# Patient Record
Sex: Male | Born: 1955 | Race: White | Hispanic: No | Marital: Married | Smoking: Never smoker
Health system: Southern US, Community
[De-identification: ages and names within clinical notes are randomized; demographics above are authoritative.]

## PROBLEM LIST (undated history)

## (undated) DIAGNOSIS — T8859XA Other complications of anesthesia, initial encounter: Secondary | ICD-10-CM

## (undated) DIAGNOSIS — R112 Nausea with vomiting, unspecified: Secondary | ICD-10-CM

## (undated) DIAGNOSIS — F419 Anxiety disorder, unspecified: Secondary | ICD-10-CM

## (undated) DIAGNOSIS — J302 Other seasonal allergic rhinitis: Secondary | ICD-10-CM

## (undated) DIAGNOSIS — K219 Gastro-esophageal reflux disease without esophagitis: Secondary | ICD-10-CM

## (undated) DIAGNOSIS — I10 Essential (primary) hypertension: Secondary | ICD-10-CM

## (undated) DIAGNOSIS — Z87442 Personal history of urinary calculi: Secondary | ICD-10-CM

## (undated) DIAGNOSIS — M1990 Unspecified osteoarthritis, unspecified site: Secondary | ICD-10-CM

## (undated) DIAGNOSIS — K5730 Diverticulosis of large intestine without perforation or abscess without bleeding: Secondary | ICD-10-CM

## (undated) DIAGNOSIS — IMO0002 Reserved for concepts with insufficient information to code with codable children: Secondary | ICD-10-CM

## (undated) DIAGNOSIS — T7840XA Allergy, unspecified, initial encounter: Secondary | ICD-10-CM

## (undated) DIAGNOSIS — Z9889 Other specified postprocedural states: Secondary | ICD-10-CM

## (undated) DIAGNOSIS — C801 Malignant (primary) neoplasm, unspecified: Secondary | ICD-10-CM

## (undated) DIAGNOSIS — I447 Left bundle-branch block, unspecified: Secondary | ICD-10-CM

## (undated) HISTORY — DX: Allergy, unspecified, initial encounter: T78.40XA

## (undated) HISTORY — PX: LITHOTRIPSY: SUR834

## (undated) HISTORY — PX: SPINE SURGERY: SHX786

## (undated) HISTORY — DX: Diverticulosis of large intestine without perforation or abscess without bleeding: K57.30

## (undated) HISTORY — PX: NECK SURGERY: SHX720

---

## 1994-03-30 HISTORY — PX: COLONOSCOPY: SHX174

## 1999-03-31 HISTORY — PX: COLONOSCOPY: SHX174

## 2001-03-17 ENCOUNTER — Encounter: Payer: Self-pay | Admitting: Family Medicine

## 2001-03-17 ENCOUNTER — Ambulatory Visit (HOSPITAL_COMMUNITY): Admission: RE | Admit: 2001-03-17 | Discharge: 2001-03-17 | Payer: Self-pay | Admitting: Family Medicine

## 2003-11-06 ENCOUNTER — Ambulatory Visit (HOSPITAL_COMMUNITY): Admission: RE | Admit: 2003-11-06 | Discharge: 2003-11-06 | Payer: Self-pay | Admitting: Family Medicine

## 2004-09-10 ENCOUNTER — Ambulatory Visit (HOSPITAL_COMMUNITY): Admission: RE | Admit: 2004-09-10 | Discharge: 2004-09-10 | Payer: Self-pay | Admitting: Family Medicine

## 2004-10-21 ENCOUNTER — Ambulatory Visit (HOSPITAL_COMMUNITY): Admission: RE | Admit: 2004-10-21 | Discharge: 2004-10-21 | Payer: Self-pay | Admitting: Family Medicine

## 2004-10-27 ENCOUNTER — Ambulatory Visit (HOSPITAL_COMMUNITY): Admission: RE | Admit: 2004-10-27 | Discharge: 2004-10-27 | Payer: Self-pay | Admitting: Urology

## 2004-10-29 ENCOUNTER — Ambulatory Visit (HOSPITAL_BASED_OUTPATIENT_CLINIC_OR_DEPARTMENT_OTHER): Admission: RE | Admit: 2004-10-29 | Discharge: 2004-10-29 | Payer: Self-pay | Admitting: Urology

## 2006-03-30 HISTORY — PX: HERNIA REPAIR: SHX51

## 2006-04-08 ENCOUNTER — Ambulatory Visit (HOSPITAL_COMMUNITY): Admission: RE | Admit: 2006-04-08 | Discharge: 2006-04-08 | Payer: Self-pay | Admitting: Urology

## 2006-06-30 ENCOUNTER — Encounter: Admission: RE | Admit: 2006-06-30 | Discharge: 2006-06-30 | Payer: Self-pay | Admitting: General Surgery

## 2008-01-09 ENCOUNTER — Ambulatory Visit: Payer: Self-pay | Admitting: Internal Medicine

## 2008-01-09 ENCOUNTER — Ambulatory Visit (HOSPITAL_COMMUNITY): Admission: RE | Admit: 2008-01-09 | Discharge: 2008-01-09 | Payer: Self-pay | Admitting: Internal Medicine

## 2008-01-09 HISTORY — PX: COLONOSCOPY: SHX174

## 2010-08-12 NOTE — Op Note (Signed)
NAMEHARLESS, MOLINARI              ACCOUNT NO.:  [id number]   MEDICAL RECORD NO.:  [id number]          PATIENT TYPE:  AMB   LOCATION:  DAY                           FACILITY:  [facility]   PHYSICIAN:  R. Roetta Sessions, M.D. DATE OF BIRTH:  12-17-1955   DATE OF PROCEDURE:  DATE OF DISCHARGE:                               OPERATIVE REPORT   PROCEDURE:  Ileocolonoscopy surveillance.   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  He had a colonoscopy in 2001  demonstrated a left-sided diverticulosis.  He is devoid of any lower GI  tract symptoms.  He is here for surveillance.  No family history of  colorectal neoplasia.  Colonoscopy now being done.  Risks, benefits,  alternatives, and limitations have been reviewed, questions answered.  He is agreeable.  Please see the documentation in the medical record.   PROCEDURE NOTE:  O2 saturation, blood pressure, pulse, and respirations  were monitored throughout the entire procedure.   CONSCIOUS SEDATION:  Versed 4 mg IV and Demerol 100 mg IV in divided  doses.   INSTRUMENT:  Pentax video chip system.   FINDINGS:  Digital rectal exam revealed no abnormalities.  Endoscopic  findings:  The prep was adequate.  Colon:  Colonic mucosa was surveyed  from the rectosigmoid junction through the left transverse, right colon,  appendiceal orifice, ileocecal valve, and cecum.  These structures were  well seen and photographed for the record.  Terminal ileum was intubated  to 5 cm.  From this level, the scope was slowly withdrawn.  All  previously mentioned mucosal surfaces were again seen.  The patient had  scattered left-sided diverticulum and colonic mucosa appeared normal.  Normal terminal ileum.  The scope was pulled down the rectum.  The  rectal vault was small.  I attempted to retroflex, but was unable to do  so, but for the same reason I was able see the rectal mucosa on the  fossa very well and it appeared normal.  The patient tolerated the  procedure well and was reacted to Endoscopy.   IMPRESSION:  Normal rectum, left-sided diverticulum, colonic mucosa, and  terminal ileum mucosa appeared normal.   RECOMMENDATIONS:  Repeat colonoscopy in 5-7 years.      Jonathon Bellows, M.D.  Electronically Signed     RMR/MEDQ  D:  01/09/2008  T:  01/09/2008  Job:  [id number]   cc:   Mila Homer. Sudie Bailey, M.D.  Fax: [phone]

## 2010-08-15 NOTE — Op Note (Signed)
NAMERONIT, MARCZAK NO.:  [id number]   MEDICAL RECORD NO.:  [id number]          PATIENT TYPE:  AMB   LOCATION:  [facility]                         FACILITY:  [facility]   PHYSICIAN:  Courtney Paris, M.D.DATE OF BIRTH:  Jul 11, 1955   DATE OF PROCEDURE:  10/29/2004  DATE OF DISCHARGE:  10/29/2004                                 OPERATIVE REPORT   REDICTATION   PREOPERATIVE DIAGNOSIS:  Left distal ureteral stone.   POSTOPERATIVE DIAGNOSIS:  No stone found.   PROCEDURE:  Cystoscopy, ureteroscopy and retrograde pyelogram.   ANESTHESIA:  General.   SURGEON:  Courtney Paris, M.D.   ASSISTANT:  Glade Nurse, M.D.   BRIEF HISTORY:  This 55 year old patient presented with what was thought to  be a persistent left ureteral stone 8 x 5 mm present on x-ray since October 06, 2004. He went to the emergency room at [facility] at that time and it was  a proximal stone then moved down to the distal ureter. I had him on the  lithotriptor truck on October 27, 2004 I was unable to see the stone on  fluoroscopy so the procedure was cancelled. He had had a previous  lithotripsy after a failed ureteroscopy in 2001. This is his second stone.   The patient was brought to the operating room and placed on the table in  dorsal lithotomy position and after satisfactory induction of general  anesthesia was given IV antibiotics. The patient was cystoscoped and the  bladder looked free of any mucosal lesions. The left orifice looked normal.  The left ureteral orifice was somewhat tight and I had to use an open-ended  catheter with the Glidewire in it to get this into the orifice and then was  able to pass the Glidewire up to the level of the kidney under fluoroscopy.  When this was done, the open-ended catheter was then removed and the  cystoscope was then also removed. The center portion of a ureteral access  sheath was then slid over the guidewire to dilate the distal ureter. This  was done under fluoroscopy. This was then removed leaving the Glidewire in  place, a 6-French ureteroscope was then passed up the left ureter but all  the way up to the level of the iliac vessels crossing the ureter. No stones  were seen despite several passes. With some hyperemia and erythema of the  distal ureter but no stones. The scope and instruments were then removed.  The Glidewire was then removed and  the bladder drained. The patient was then taken to the recovery room in good  condition and apparently the stone had passed without knowing it although it  was still present on the most recent x-rays that he had had previously. The  patient later sent home as an outpatient in good condition.      Courtney Paris, M.D.  Electronically Signed     HMK/MEDQ  D:  11/13/2004  T:  11/13/2004  Job:  [id number]

## 2010-08-15 NOTE — Op Note (Signed)
[name], [name] NO.:  [id number]   MEDICAL RECORD NO.:  [id number]          PATIENT TYPE:  AMB   LOCATION:  [facility]                         FACILITY:  [facility]   PHYSICIAN:  Courtney Paris, M.D.DATE OF BIRTH:  11/10/1955   DATE OF PROCEDURE:  DATE OF DISCHARGE:                                 OPERATIVE REPORT   PREOPERATIVE DIAGNOSIS:  Left ureteral stone.   POSTOPERATIVE DIAGNOSIS:  Left ureteral stone, passed.   PROCEDURE:  1.  Cystourethroscopy.  2.  Left ureteroscopy.  3.  Retrograde pyelography.   SURGEON:  Courtney Paris, M.D.   ASSISTANT:  Glade Nurse, M.D.   ANESTHESIA:  General endotracheal.   SPECIMENS:  None.   PROCEDURE:  Patient was identified by his wrist bracelet and brought to room  #2, where he received preoperative antibiotics and was prepped and draped in  the usual sterile fashion.  Next, a 22 French cystoscopic sheath using a 12  degree lenses was inserted into his anterior and posterior urethra without  oral mucosal abnormalities or foreign bodies.  Upon entering his bladder,  his bilateral ureteral orifices were noted to be in their normal anatomic  position, effluxing clear urine bilaterally.  We turned our attention to the  left ureteral orifice.  Using a 6 [place] end hole catheter as a guide, we  advanced a 0.038 Glidewire to the level of the left renal pelvis under  fluoroscopic guidance.  We did not see any stones along the course of the  ureter.  We then back-loaded this scope off the wire and used the 12 French  anterior sheath from a 35 cm ureteral axis sheath to passively dilate the  ureter, which was somewhat stenotic distally at its orifice.  We then  removed the axis sheath over the wire, and then using a semi-rigid  ureteroscope, was introduced into the bladder.  We easily passed it into the  left ureteral orifice to the level of the  pelvic brim.  No stones, foreign bodies, or mucosal abnormalities  were  appreciated.  We removed the ureteroscope, emptied the patient's bladder,  and removed the glide wire.   Dr. Vic Blackbird was present and participated in all aspects of this  case.     ______________________________  Glade Nurse, MD      Courtney Paris, M.D.  Electronically Signed    MT/MEDQ  D:  10/29/2004  T:  10/29/2004  Job:  [id number]

## 2011-06-30 ENCOUNTER — Other Ambulatory Visit (HOSPITAL_COMMUNITY): Payer: Self-pay | Admitting: Family Medicine

## 2011-06-30 ENCOUNTER — Ambulatory Visit (HOSPITAL_COMMUNITY)
Admission: RE | Admit: 2011-06-30 | Discharge: 2011-06-30 | Disposition: A | Discharge status: Home / Self Care | Payer: 59 | Source: Ambulatory Visit | Attending: Family Medicine | Admitting: Family Medicine

## 2011-06-30 DIAGNOSIS — M5430 Sciatica, unspecified side: Secondary | ICD-10-CM

## 2011-06-30 DIAGNOSIS — M51379 Other intervertebral disc degeneration, lumbosacral region without mention of lumbar back pain or lower extremity pain: Secondary | ICD-10-CM | POA: Insufficient documentation

## 2011-06-30 DIAGNOSIS — M79609 Pain in unspecified limb: Secondary | ICD-10-CM | POA: Insufficient documentation

## 2011-06-30 DIAGNOSIS — M545 Low back pain, unspecified: Secondary | ICD-10-CM | POA: Insufficient documentation

## 2011-06-30 DIAGNOSIS — M5137 Other intervertebral disc degeneration, lumbosacral region: Secondary | ICD-10-CM | POA: Insufficient documentation

## 2011-07-07 ENCOUNTER — Other Ambulatory Visit (HOSPITAL_COMMUNITY): Payer: Self-pay | Admitting: Family Medicine

## 2011-07-07 DIAGNOSIS — M5430 Sciatica, unspecified side: Secondary | ICD-10-CM

## 2011-07-08 ENCOUNTER — Ambulatory Visit (HOSPITAL_COMMUNITY)
Admission: RE | Admit: 2011-07-08 | Discharge: 2011-07-08 | Disposition: A | Discharge status: Home / Self Care | Payer: 59 | Source: Ambulatory Visit | Attending: Family Medicine | Admitting: Family Medicine

## 2011-07-08 DIAGNOSIS — M5137 Other intervertebral disc degeneration, lumbosacral region: Secondary | ICD-10-CM | POA: Insufficient documentation

## 2011-07-08 DIAGNOSIS — M5126 Other intervertebral disc displacement, lumbar region: Secondary | ICD-10-CM | POA: Insufficient documentation

## 2011-07-08 DIAGNOSIS — R209 Unspecified disturbances of skin sensation: Secondary | ICD-10-CM | POA: Insufficient documentation

## 2011-07-08 DIAGNOSIS — M79609 Pain in unspecified limb: Secondary | ICD-10-CM | POA: Insufficient documentation

## 2011-07-08 DIAGNOSIS — M5430 Sciatica, unspecified side: Secondary | ICD-10-CM

## 2011-07-08 DIAGNOSIS — M51379 Other intervertebral disc degeneration, lumbosacral region without mention of lumbar back pain or lower extremity pain: Secondary | ICD-10-CM | POA: Insufficient documentation

## 2011-07-08 DIAGNOSIS — M545 Low back pain, unspecified: Secondary | ICD-10-CM | POA: Insufficient documentation

## 2011-07-09 ENCOUNTER — Other Ambulatory Visit (HOSPITAL_COMMUNITY): Payer: 59

## 2011-07-10 ENCOUNTER — Other Ambulatory Visit: Payer: Self-pay | Admitting: Family Medicine

## 2011-07-10 ENCOUNTER — Ambulatory Visit
Admission: RE | Admit: 2011-07-10 | Discharge: 2011-07-10 | Disposition: A | Discharge status: Home / Self Care | Payer: 59 | Source: Ambulatory Visit | Attending: Family Medicine | Admitting: Family Medicine

## 2011-07-10 DIAGNOSIS — M549 Dorsalgia, unspecified: Secondary | ICD-10-CM

## 2011-07-10 MED ORDER — METHYLPREDNISOLONE ACETATE 40 MG/ML INJ SUSP (RADIOLOG
120.0000 mg | Freq: Once | INTRAMUSCULAR | Status: AC
  Administered 2011-07-10: 120 mg via EPIDURAL

## 2011-07-10 MED ORDER — IOHEXOL 180 MG/ML  SOLN
1.0000 mL | Freq: Once | INTRAMUSCULAR | Status: AC | PRN
  Administered 2011-07-10: 1 mL via EPIDURAL

## 2011-07-10 NOTE — Discharge Instructions (Signed)

## 2011-07-24 ENCOUNTER — Ambulatory Visit
Admission: RE | Admit: 2011-07-24 | Discharge: 2011-07-24 | Disposition: A | Discharge status: Home / Self Care | Payer: 59 | Source: Ambulatory Visit | Attending: Family Medicine | Admitting: Family Medicine

## 2011-07-24 VITALS — BP 131/76 | HR 65

## 2011-07-24 DIAGNOSIS — M549 Dorsalgia, unspecified: Secondary | ICD-10-CM

## 2011-07-24 MED ORDER — IOHEXOL 180 MG/ML  SOLN
1.0000 mL | Freq: Once | INTRAMUSCULAR | Status: AC | PRN
  Administered 2011-07-24: 1 mL via EPIDURAL

## 2011-07-24 MED ORDER — METHYLPREDNISOLONE ACETATE 40 MG/ML INJ SUSP (RADIOLOG
120.0000 mg | Freq: Once | INTRAMUSCULAR | Status: AC
  Administered 2011-07-24: 120 mg via EPIDURAL

## 2012-02-11 ENCOUNTER — Encounter (INDEPENDENT_AMBULATORY_CARE_PROVIDER_SITE_OTHER): Payer: Self-pay | Admitting: General Surgery

## 2012-11-15 ENCOUNTER — Ambulatory Visit (HOSPITAL_COMMUNITY)
Admission: RE | Admit: 2012-11-15 | Discharge: 2012-11-15 | Disposition: A | Discharge status: Home / Self Care | Payer: 59 | Source: Ambulatory Visit | Attending: Urology | Admitting: Urology

## 2012-11-15 ENCOUNTER — Other Ambulatory Visit (HOSPITAL_COMMUNITY): Payer: Self-pay | Admitting: Urology

## 2012-11-15 DIAGNOSIS — M479 Spondylosis, unspecified: Secondary | ICD-10-CM | POA: Insufficient documentation

## 2012-11-15 DIAGNOSIS — C61 Malignant neoplasm of prostate: Secondary | ICD-10-CM

## 2012-11-15 DIAGNOSIS — Z8546 Personal history of malignant neoplasm of prostate: Secondary | ICD-10-CM | POA: Insufficient documentation

## 2012-11-15 DIAGNOSIS — I77810 Thoracic aortic ectasia: Secondary | ICD-10-CM | POA: Insufficient documentation

## 2012-11-21 ENCOUNTER — Other Ambulatory Visit: Payer: Self-pay | Admitting: Urology

## 2012-12-21 ENCOUNTER — Encounter: Payer: Self-pay | Admitting: Internal Medicine

## 2013-01-02 ENCOUNTER — Telehealth: Payer: Self-pay | Admitting: Internal Medicine

## 2013-01-02 NOTE — Telephone Encounter (Signed)
Pt received letter from [place] to schedule his next tcs, but he is getting ready to have surgery and needs to wait until he is medically cleared by his doctor before scheduling tcs. He said that he would call us back when ready.

## 2013-01-02 NOTE — Telephone Encounter (Signed)
Lets call him in 3 mos if we haven't heard from him

## 2013-01-03 NOTE — Telephone Encounter (Signed)
Reminder in epic °

## 2013-01-11 ENCOUNTER — Encounter (HOSPITAL_COMMUNITY): Payer: Self-pay | Admitting: Pharmacy Technician

## 2013-01-19 ENCOUNTER — Other Ambulatory Visit (HOSPITAL_COMMUNITY): Payer: Self-pay | Admitting: *Deleted

## 2013-01-19 NOTE — Patient Instructions (Addendum)
20      Your procedure is scheduled on:  Wednesday 01/25/2013  Report to [facility] at  0600 AM.  Call this number if you have problems the night before or morning of surgery: [phone]   Remember:   Do not eat food or drink liquids AFTER MIDNIGHT!   Do not bring valuables to the hospital. [name] IS NOT RESPONSIBLE  FOR ANY BELONGINGS OR VALUABLES BROUGHT TO HOSPITAL.  Leave suitcase in the car. After surgery it may be brought to your room.  For patients admitted to the hospital, checkout time is 11:00 AM the day of              Discharge.    DO NOT WEAR JEWELRY , MAKE-UP, LOTIONS,POWDERS,PERFUMES!             WOMEN -DO NOT SHAVE LEGS OR UNDERARMS 12 HRS. BEFORE SURGERY!               MEN MAY SHAVE AS USUAL!             CONTACTS,DENTURES OR BRIDGEWORK, FALSE EYELASHES MAY NOT BE WORN INTO SURGERY!                                 Special Instructions:             Please read over the following fact sheets that you were given:             1. [name] PREPARING FOR SURGERY SHEET                2. Blood fact sheet               Troy [name]     [phone]                FAILURE TO FOLLOW THESE INSTRUCTIONS MAY RESULT IN   CANCELLATION OF YOUR SURGERY!               Patient Signature:___________________________

## 2013-01-20 ENCOUNTER — Encounter (HOSPITAL_COMMUNITY)
Admission: RE | Admit: 2013-01-20 | Discharge: 2013-01-20 | Disposition: A | Discharge status: Home / Self Care | Payer: 59 | Source: Ambulatory Visit | Attending: Urology | Admitting: Urology

## 2013-01-20 ENCOUNTER — Encounter (HOSPITAL_COMMUNITY): Payer: Self-pay

## 2013-01-20 DIAGNOSIS — Z01818 Encounter for other preprocedural examination: Secondary | ICD-10-CM | POA: Insufficient documentation

## 2013-01-20 DIAGNOSIS — Z01812 Encounter for preprocedural laboratory examination: Secondary | ICD-10-CM | POA: Insufficient documentation

## 2013-01-20 HISTORY — DX: Malignant (primary) neoplasm, unspecified: C80.1

## 2013-01-20 HISTORY — DX: Unspecified osteoarthritis, unspecified site: M19.90

## 2013-01-20 HISTORY — DX: Gastro-esophageal reflux disease without esophagitis: K21.9

## 2013-01-20 HISTORY — DX: Personal history of urinary calculi: Z87.442

## 2013-01-20 HISTORY — DX: Reserved for concepts with insufficient information to code with codable children: IMO0002

## 2013-01-20 HISTORY — DX: Other seasonal allergic rhinitis: J30.2

## 2013-01-20 LAB — BASIC METABOLIC PANEL
Calcium: 9.3 mg/dL (ref 8.4–10.5)
GFR calc Af Amer: >90 mL/min (ref >90)
GFR calc non Af Amer: 82 mL/min — ABNORMAL LOW (ref >90)

## 2013-01-20 LAB — CBC
MCH: 31.5 pg (ref 26.0–34.0)
MCHC: 36 g/dL (ref 30.0–36.0)
Platelets: 174 10*3/uL (ref 150–400)
RBC: 4.76 MIL/uL (ref 4.22–5.81)

## 2013-01-20 NOTE — Progress Notes (Signed)
Chest x-ray 11/15/12 on EPIC

## 2013-01-24 NOTE — Progress Notes (Signed)
Instructed patient to arrive to Short Stay at 0530 am on 01/25/13. Nothing to eat or drink after midnight. Verbalizes understanding.

## 2013-01-24 NOTE — Anesthesia Preprocedure Evaluation (Addendum)
Anesthesia Evaluation  Patient identified by MRN, date of birth, ID band Patient awake    Reviewed: Allergy & Precautions, H&P , NPO status , Patient's Chart, lab work & pertinent test results  Airway Mallampati: II TM Distance: >3 FB Neck ROM: full    Dental no notable dental hx. (+) Teeth Intact and Dental Advisory Given   Pulmonary neg pulmonary ROS,  breath sounds clear to auscultation  Pulmonary exam normal       Cardiovascular Exercise Tolerance: Good negative cardio ROS  Rhythm:regular Rate:Normal     Neuro/Psych negative neurological ROS  negative psych ROS   GI/Hepatic negative GI ROS, Neg liver ROS, GERD-  Controlled,  Endo/Other  negative endocrine ROS  Renal/GU negative Renal ROS  negative genitourinary   Musculoskeletal   Abdominal   Peds  Hematology negative hematology ROS (+)   Anesthesia Other Findings   Reproductive/Obstetrics negative OB ROS                          Anesthesia Physical Anesthesia Plan  ASA: II  Anesthesia Plan: General   Post-op Pain Management:    Induction: Intravenous  Airway Management Planned: Oral ETT  Additional Equipment:   Intra-op Plan:   Post-operative Plan: Extubation in OR  Informed Consent: I have reviewed the patients History and Physical, chart, labs and discussed the procedure including the risks, benefits and alternatives for the proposed anesthesia with the patient or authorized representative who has indicated his/her understanding and acceptance.   Dental Advisory Given  Plan Discussed with: CRNA and Surgeon  Anesthesia Plan Comments:         Anesthesia Quick Evaluation

## 2013-01-25 ENCOUNTER — Ambulatory Visit (HOSPITAL_COMMUNITY): Payer: 59 | Admitting: Anesthesiology

## 2013-01-25 ENCOUNTER — Encounter (HOSPITAL_COMMUNITY): Payer: 59 | Admitting: Anesthesiology

## 2013-01-25 ENCOUNTER — Observation Stay (HOSPITAL_COMMUNITY)
Admission: RE | Admit: 2013-01-25 | Discharge: 2013-01-26 | Disposition: A | Discharge status: Home / Self Care | Payer: 59 | Source: Ambulatory Visit | Attending: Urology | Admitting: Urology

## 2013-01-25 ENCOUNTER — Encounter (HOSPITAL_COMMUNITY): Payer: Self-pay | Admitting: *Deleted

## 2013-01-25 ENCOUNTER — Encounter (HOSPITAL_COMMUNITY)
Admission: RE | Disposition: A | Discharge status: Home / Self Care | Payer: Self-pay | Source: Ambulatory Visit | Attending: Urology

## 2013-01-25 DIAGNOSIS — Z87442 Personal history of urinary calculi: Secondary | ICD-10-CM | POA: Insufficient documentation

## 2013-01-25 DIAGNOSIS — C61 Malignant neoplasm of prostate: Principal | ICD-10-CM | POA: Insufficient documentation

## 2013-01-25 DIAGNOSIS — R110 Nausea: Secondary | ICD-10-CM | POA: Insufficient documentation

## 2013-01-25 DIAGNOSIS — N3289 Other specified disorders of bladder: Secondary | ICD-10-CM | POA: Insufficient documentation

## 2013-01-25 DIAGNOSIS — K219 Gastro-esophageal reflux disease without esophagitis: Secondary | ICD-10-CM | POA: Insufficient documentation

## 2013-01-25 DIAGNOSIS — R21 Rash and other nonspecific skin eruption: Secondary | ICD-10-CM | POA: Insufficient documentation

## 2013-01-25 HISTORY — PX: ROBOT ASSISTED LAPAROSCOPIC RADICAL PROSTATECTOMY: SHX5141

## 2013-01-25 LAB — TYPE AND SCREEN
ABO/RH(D): A POS
Antibody Screen: NEGATIVE

## 2013-01-25 LAB — HEMOGLOBIN AND HEMATOCRIT, BLOOD: HCT: 42.7 % (ref 39.0–52.0)

## 2013-01-25 SURGERY — ROBOTIC ASSISTED LAPAROSCOPIC RADICAL PROSTATECTOMY LEVEL 1
Anesthesia: General | Site: Abdomen | Wound class: Clean Contaminated

## 2013-01-25 MED ORDER — LORATADINE 10 MG PO TABS
10.0000 mg | ORAL_TABLET | Freq: Every day | ORAL | Status: DC
  Administered 2013-01-26: 10 mg via ORAL
  Filled 2013-01-25 (×2): qty 1

## 2013-01-25 MED ORDER — MORPHINE SULFATE 2 MG/ML IJ SOLN
2.0000 mg | INTRAMUSCULAR | Status: DC | PRN
  Administered 2013-01-26 (×2): 2 mg via INTRAVENOUS
  Filled 2013-01-25 (×2): qty 1

## 2013-01-25 MED ORDER — SODIUM CHLORIDE 0.9 % IJ SOLN: 3.0000 mL | INTRAMUSCULAR | Status: DC | PRN

## 2013-01-25 MED ORDER — LACTATED RINGERS IV SOLN: INTRAVENOUS | Status: DC

## 2013-01-25 MED ORDER — LACTATED RINGERS IV SOLN
INTRAVENOUS | Status: DC | PRN
  Administered 2013-01-25: 09:00:00

## 2013-01-25 MED ORDER — SODIUM CHLORIDE 0.9 % IV SOLN: 250.0000 mL | INTRAVENOUS | Status: DC | PRN

## 2013-01-25 MED ORDER — BUPIVACAINE-EPINEPHRINE 0.25% -1:200000 IJ SOLN
INTRAMUSCULAR | Status: AC
  Filled 2013-01-25: qty 1

## 2013-01-25 MED ORDER — SODIUM CHLORIDE 0.9 % IJ SOLN
INTRAMUSCULAR | Status: AC
  Filled 2013-01-25: qty 20

## 2013-01-25 MED ORDER — DEXAMETHASONE SODIUM PHOSPHATE 10 MG/ML IJ SOLN
INTRAMUSCULAR | Status: DC | PRN
  Administered 2013-01-25: 10 mg via INTRAVENOUS

## 2013-01-25 MED ORDER — CIPROFLOXACIN IN D5W 200 MG/100ML IV SOLN
200.0000 mg | Freq: Two times a day (BID) | INTRAVENOUS | Status: DC
  Administered 2013-01-25: 200 mg via INTRAVENOUS
  Filled 2013-01-25 (×2): qty 100

## 2013-01-25 MED ORDER — OXYBUTYNIN CHLORIDE 5 MG PO TABS
5.0000 mg | ORAL_TABLET | Freq: Three times a day (TID) | ORAL | Status: DC | PRN
  Filled 2013-01-25: qty 1

## 2013-01-25 MED ORDER — BUPIVACAINE LIPOSOME 1.3 % IJ SUSP
20.0000 mL | Freq: Once | INTRAMUSCULAR | Status: DC
  Filled 2013-01-25: qty 20

## 2013-01-25 MED ORDER — DIPHENHYDRAMINE HCL 50 MG/ML IJ SOLN
25.0000 mg | Freq: Four times a day (QID) | INTRAMUSCULAR | Status: DC | PRN
Start: 2013-01-25 — End: 2013-01-26
  Administered 2013-01-25: 21:00:00 25 mg via INTRAVENOUS
  Filled 2013-01-25: qty 1

## 2013-01-25 MED ORDER — CIPROFLOXACIN HCL 500 MG PO TABS: 500.0000 mg | ORAL_TABLET | Freq: Two times a day (BID) | ORAL | Status: DC

## 2013-01-25 MED ORDER — SODIUM CHLORIDE 0.9 % IV BOLUS (SEPSIS)
1000.0000 mL | Freq: Once | INTRAVENOUS | Status: AC
  Administered 2013-01-25: 1000 mL via INTRAVENOUS

## 2013-01-25 MED ORDER — POLYVINYL ALCOHOL 1.4 % OP SOLN
1.0000 [drp] | Freq: Every day | OPHTHALMIC | Status: DC
  Administered 2013-01-26: 1 [drp] via OPHTHALMIC
  Filled 2013-01-25: qty 15

## 2013-01-25 MED ORDER — HYDROMORPHONE HCL PF 1 MG/ML IJ SOLN: 0.2500 mg | INTRAMUSCULAR | Status: DC | PRN

## 2013-01-25 MED ORDER — SODIUM CHLORIDE 0.9 % IV SOLN
INTRAVENOUS | Status: DC
  Administered 2013-01-25: 23:00:00 via INTRAVENOUS

## 2013-01-25 MED ORDER — OXYCODONE HCL 5 MG PO TABS: 5.0000 mg | ORAL_TABLET | ORAL | Status: DC | PRN

## 2013-01-25 MED ORDER — HYDROMORPHONE HCL PF 1 MG/ML IJ SOLN
INTRAMUSCULAR | Status: DC | PRN
  Administered 2013-01-25: 1 mg via INTRAVENOUS

## 2013-01-25 MED ORDER — SODIUM CHLORIDE 0.9 % IR SOLN
Status: DC | PRN
  Administered 2013-01-25: 1000 mL

## 2013-01-25 MED ORDER — LACTATED RINGERS IV SOLN
INTRAVENOUS | Status: DC | PRN
  Administered 2013-01-25 (×2): via INTRAVENOUS

## 2013-01-25 MED ORDER — LIDOCAINE HCL (PF) 2 % IJ SOLN
INTRAMUSCULAR | Status: DC | PRN
  Administered 2013-01-25: 40 mg

## 2013-01-25 MED ORDER — HEPARIN SODIUM (PORCINE) 1000 UNIT/ML IJ SOLN
INTRAMUSCULAR | Status: AC
  Filled 2013-01-25: qty 1

## 2013-01-25 MED ORDER — ROCURONIUM BROMIDE 100 MG/10ML IV SOLN
INTRAVENOUS | Status: DC | PRN
  Administered 2013-01-25: 10 mg via INTRAVENOUS
  Administered 2013-01-25: 50 mg via INTRAVENOUS
  Administered 2013-01-25: 10 mg via INTRAVENOUS

## 2013-01-25 MED ORDER — PROMETHAZINE HCL 25 MG/ML IJ SOLN
12.5000 mg | INTRAMUSCULAR | Status: DC | PRN
  Administered 2013-01-25: 12.5 mg via INTRAVENOUS

## 2013-01-25 MED ORDER — PROPOFOL 10 MG/ML IV BOLUS
INTRAVENOUS | Status: DC | PRN
  Administered 2013-01-25: 200 mg via INTRAVENOUS

## 2013-01-25 MED ORDER — OXYCODONE HCL 5 MG PO TABS
5.0000 mg | ORAL_TABLET | ORAL | Status: DC | PRN
  Administered 2013-01-26: 5 mg via ORAL
  Filled 2013-01-25: qty 1

## 2013-01-25 MED ORDER — BISACODYL 10 MG RE SUPP
10.0000 mg | Freq: Two times a day (BID) | RECTAL | Status: DC
  Administered 2013-01-26: 10 mg via RECTAL
  Filled 2013-01-25: qty 1

## 2013-01-25 MED ORDER — CIPROFLOXACIN IN D5W 400 MG/200ML IV SOLN
INTRAVENOUS | Status: AC
  Filled 2013-01-25: qty 200

## 2013-01-25 MED ORDER — STERILE WATER FOR IRRIGATION IR SOLN
Status: DC | PRN
  Administered 2013-01-25: 3000 mL

## 2013-01-25 MED ORDER — DIPHENHYDRAMINE HCL 50 MG/ML IJ SOLN
INTRAMUSCULAR | Status: DC | PRN
  Administered 2013-01-25: 25 mg via INTRAVENOUS

## 2013-01-25 MED ORDER — BACITRACIN-NEOMYCIN-POLYMYXIN 400-5-5000 EX OINT: 1.0000 "application " | TOPICAL_OINTMENT | Freq: Three times a day (TID) | CUTANEOUS | Status: DC | PRN

## 2013-01-25 MED ORDER — SODIUM CHLORIDE 0.9 % IJ SOLN: 3.0000 mL | Freq: Two times a day (BID) | INTRAMUSCULAR | Status: DC

## 2013-01-25 MED ORDER — SENNOSIDES-DOCUSATE SODIUM 8.6-50 MG PO TABS
1.0000 | ORAL_TABLET | Freq: Two times a day (BID) | ORAL | Status: DC
  Administered 2013-01-25 – 2013-01-26 (×2): 1 via ORAL
  Filled 2013-01-25 (×3): qty 1

## 2013-01-25 MED ORDER — ONDANSETRON HCL 4 MG/2ML IJ SOLN
INTRAMUSCULAR | Status: DC | PRN
  Administered 2013-01-25 (×2): 4 mg via INTRAVENOUS

## 2013-01-25 MED ORDER — METHYLENE BLUE 1 % INJ SOLN
INTRAMUSCULAR | Status: AC
  Filled 2013-01-25: qty 20

## 2013-01-25 MED ORDER — METHYLENE BLUE 1 % INJ SOLN
INTRAMUSCULAR | Status: DC | PRN
  Administered 2013-01-25: 5 mL via INTRAVENOUS
  Administered 2013-01-25: 10 mL via INTRAVENOUS

## 2013-01-25 MED ORDER — SUCCINYLCHOLINE CHLORIDE 20 MG/ML IJ SOLN
INTRAMUSCULAR | Status: DC | PRN
  Administered 2013-01-25: 100 mg via INTRAVENOUS

## 2013-01-25 MED ORDER — HYOSCYAMINE SULFATE 0.125 MG SL SUBL
0.1250 mg | SUBLINGUAL_TABLET | SUBLINGUAL | Status: DC | PRN
  Filled 2013-01-25: qty 1

## 2013-01-25 MED ORDER — FENTANYL CITRATE 0.05 MG/ML IJ SOLN
INTRAMUSCULAR | Status: DC | PRN
  Administered 2013-01-25 (×2): 50 ug via INTRAVENOUS
  Administered 2013-01-25: 100 ug via INTRAVENOUS
  Administered 2013-01-25: 50 ug via INTRAVENOUS

## 2013-01-25 MED ORDER — EPHEDRINE SULFATE 50 MG/ML IJ SOLN
INTRAMUSCULAR | Status: DC | PRN
  Administered 2013-01-25 (×3): 5 mg via INTRAVENOUS

## 2013-01-25 MED ORDER — HEPARIN SODIUM (PORCINE) 5000 UNIT/ML IJ SOLN
5000.0000 [IU] | Freq: Three times a day (TID) | INTRAMUSCULAR | Status: DC
  Administered 2013-01-25 – 2013-01-26 (×2): 5000 [IU] via SUBCUTANEOUS
  Filled 2013-01-25 (×5): qty 1

## 2013-01-25 MED ORDER — CIPROFLOXACIN IN D5W 400 MG/200ML IV SOLN
400.0000 mg | INTRAVENOUS | Status: AC
  Administered 2013-01-25: 400 mg via INTRAVENOUS

## 2013-01-25 MED ORDER — GLYCOPYRROLATE 0.2 MG/ML IJ SOLN
INTRAMUSCULAR | Status: DC | PRN
  Administered 2013-01-25: 0.6 mg via INTRAVENOUS

## 2013-01-25 MED ORDER — NEOSTIGMINE METHYLSULFATE 1 MG/ML IJ SOLN
INTRAMUSCULAR | Status: DC | PRN
  Administered 2013-01-25: 4 mg via INTRAVENOUS

## 2013-01-25 MED ORDER — MIDAZOLAM HCL 5 MG/5ML IJ SOLN
INTRAMUSCULAR | Status: DC | PRN
  Administered 2013-01-25: 2 mg via INTRAVENOUS

## 2013-01-25 MED ORDER — PROMETHAZINE HCL 25 MG/ML IJ SOLN
INTRAMUSCULAR | Status: AC
  Filled 2013-01-25: qty 1

## 2013-01-25 MED ORDER — BUPIVACAINE LIPOSOME 1.3 % IJ SUSP
INTRAMUSCULAR | Status: DC | PRN
  Administered 2013-01-25: 20 mL

## 2013-01-25 MED ORDER — CARBOXYMETH-GLYCERIN-POLYSORB 0.5-1-0.5 % OP SOLN: 1.0000 [drp] | Freq: Every morning | OPHTHALMIC | Status: DC

## 2013-01-25 MED ORDER — ONDANSETRON HCL 4 MG/2ML IJ SOLN
4.0000 mg | INTRAMUSCULAR | Status: DC | PRN
  Administered 2013-01-25: 4 mg via INTRAVENOUS
  Filled 2013-01-25: qty 2

## 2013-01-25 SURGICAL SUPPLY — 51 items
APPLIER CLIP 5 13 M/L LIGAMAX5 (MISCELLANEOUS)
APR CLP MED LRG 5 ANG JAW (MISCELLANEOUS)
CANISTER SUCTION 2500CC (MISCELLANEOUS) ×2 IMPLANT
CATH FOLEY 2WAY SLVR 18FR 30CC (CATHETERS) ×2 IMPLANT
CATH ROBINSON RED A/P 16FR (CATHETERS) ×2 IMPLANT
CATH ROBINSON RED A/P 8FR (CATHETERS) ×2 IMPLANT
CATH TIEMANN FOLEY 18FR 5CC (CATHETERS) ×2 IMPLANT
CATH URET 5FR 28IN OPEN ENDED (CATHETERS) IMPLANT
CHLORAPREP W/TINT 26ML (MISCELLANEOUS) ×2 IMPLANT
CLIP APPLIE 5 13 M/L LIGAMAX5 (MISCELLANEOUS) IMPLANT
CLIP LIGATING HEM O LOK PURPLE (MISCELLANEOUS) IMPLANT
CLOTH BEACON ORANGE TIMEOUT ST (SAFETY) ×2 IMPLANT
CORD HIGH FREQUENCY UNIPOLAR (ELECTROSURGICAL) ×2 IMPLANT
CORDS BIPOLAR (ELECTRODE) ×2 IMPLANT
COVER SURGICAL LIGHT HANDLE (MISCELLANEOUS) ×2 IMPLANT
COVER TIP SHEARS 8 DVNC (MISCELLANEOUS) ×1 IMPLANT
COVER TIP SHEARS 8MM DA VINCI (MISCELLANEOUS) ×1
CUTTER ECHEON FLEX ENDO 45 340 (ENDOMECHANICALS) ×2 IMPLANT
DECANTER SPIKE VIAL GLASS SM (MISCELLANEOUS) ×2 IMPLANT
DRAPE SURG IRRIG POUCH 19X23 (DRAPES) ×2 IMPLANT
DRAPE UTILITY XL STRL (DRAPES) ×2 IMPLANT
DRSG TEGADERM 2-3/8X2-3/4 SM (GAUZE/BANDAGES/DRESSINGS) ×2 IMPLANT
DRSG TEGADERM 4X4.75 (GAUZE/BANDAGES/DRESSINGS) ×4 IMPLANT
DRSG TEGADERM 6X8 (GAUZE/BANDAGES/DRESSINGS) ×4 IMPLANT
ELECT REM PT RETURN 9FT ADLT (ELECTROSURGICAL) ×2
ELECTRODE REM PT RTRN 9FT ADLT (ELECTROSURGICAL) ×1 IMPLANT
GAUZE SPONGE 2X2 8PLY STRL LF (GAUZE/BANDAGES/DRESSINGS) ×1 IMPLANT
GLOVE BIOGEL M 7.0 STRL (GLOVE) IMPLANT
GLOVE BIOGEL PI IND STRL 7.5 (GLOVE) ×3 IMPLANT
GLOVE BIOGEL PI INDICATOR 7.5 (GLOVE) ×3
GLOVE ECLIPSE 7.0 STRL STRAW (GLOVE) ×4 IMPLANT
GOWN PREVENTION PLUS LG XLONG (DISPOSABLE) ×4 IMPLANT
GOWN STRL REIN XL XLG (GOWN DISPOSABLE) ×4 IMPLANT
HOLDER FOLEY CATH W/STRAP (MISCELLANEOUS) ×1 IMPLANT
IV LACTATED RINGERS 1000ML (IV SOLUTION) ×2 IMPLANT
KIT ACCESSORY DA VINCI DISP (KITS) ×1
KIT ACCESSORY DVNC DISP (KITS) ×1 IMPLANT
NDL SAFETY ECLIPSE 18X1.5 (NEEDLE) ×1 IMPLANT
NEEDLE HYPO 18GX1.5 SHARP (NEEDLE) ×2
PACK ROBOT UROLOGY CUSTOM (CUSTOM PROCEDURE TRAY) ×2 IMPLANT
POSITIONER SURGICAL ARM (MISCELLANEOUS) ×4 IMPLANT
RELOAD GREEN ECHELON 45 (STAPLE) IMPLANT
RELOAD WH ECHELON 45 (STAPLE) ×2 IMPLANT
SCRUB PCMX 4 OZ (MISCELLANEOUS) ×2 IMPLANT
SET TUBE IRRIG SUCTION NO TIP (IRRIGATION / IRRIGATOR) ×2 IMPLANT
SOLUTION ELECTROLUBE (MISCELLANEOUS) ×2 IMPLANT
SPONGE GAUZE 2X2 STER 10/PKG (GAUZE/BANDAGES/DRESSINGS) ×1
SUT VICRYL 0 UR6 27IN ABS (SUTURE) ×4 IMPLANT
SYR 27GX1/2 1ML LL SAFETY (SYRINGE) ×2 IMPLANT
TOWEL OR NON WOVEN STRL DISP B (DISPOSABLE) ×2 IMPLANT
WATER STERILE IRR 1500ML POUR (IV SOLUTION) ×4 IMPLANT

## 2013-01-25 NOTE — Transfer of Care (Signed)
Immediate Anesthesia Transfer of Care Note  Patient: Troy [name]  Procedure(s) Performed: Procedure(s): ROBOTIC ASSISTED LAPAROSCOPIC RADICAL PROSTATECTOMY LEVEL 1 (N/A)  Patient Location: PACU  Anesthesia Type:General  Level of Consciousness: awake, alert , oriented and patient cooperative  Airway & Oxygen Therapy: Patient Spontanous Breathing and Patient connected to face mask oxygen  Post-op Assessment: Report given to PACU RN and Post -op Vital signs reviewed and stable  Post vital signs: Reviewed and stable  Complications: No apparent anesthesia complications

## 2013-01-25 NOTE — Progress Notes (Signed)
Rash is much less, very faint

## 2013-01-25 NOTE — Progress Notes (Signed)
Pt has rash abdomen, legs; pt rec'd meds in OR for rash; resps even, unlabored, pt denies itching

## 2013-01-25 NOTE — H&P (Signed)
Urology History and Physical Exam  CC: Prostate cancer  HPI: 57 year old male presents for prostate cancer. This was discovered on prostate biopsy for a rising PSA. Biopsy revealed Gleason 3+3=6 in 5 cores in the left lateral mid/base and right medial bas. Staging studies were negative. Prostate volume was 15cc. This is not associated with gross hematuria. We have discussed treatment options, and he presents for robotic assisted radical prostatectomy. Positive history of left inguinal hernia repair with mesh.  PMH: Past Medical History  Diagnosis Date  . Seasonal allergies   . GERD (gastroesophageal reflux disease)     food related  . Arthritis     back  . Cancer     prostate cancer  . Herniated disc   . History of kidney stones     PSH: Past Surgical History  Procedure Laterality Date  . Lithotripsy      x2  . Hernia repair Left 2008    Allergies: Allergies  Allergen Reactions  . Penicillins Other (See Comments)    Numbness around mouth   . Darvocet [Propoxyphene-Acetaminophen] Rash  . Tylenol [Acetaminophen] Rash    Medications: Prescriptions prior to admission  Medication Sig Dispense Refill  . Carboxymeth-Glycerin-Polysorb (REFRESH OPTIVE ADVANCED) 0.5-1-0.5 % SOLN Place 1 drop into both eyes every morning.      . cetirizine (ZYRTEC) 10 MG tablet Take 10 mg by mouth daily.      Marland Kitchen ibuprofen (ADVIL) 200 MG tablet Take 200 mg by mouth every 6 (six) hours as needed for pain.      . Multiple Vitamin (MULTIVITAMIN WITH MINERALS) TABS tablet Take 1 tablet by mouth daily after supper.         Social History: History   Social History  . Marital Status: Married    Spouse Name: N/A    Number of Children: N/A  . Years of Education: N/A   Occupational History  . Not on file.   Social History Main Topics  . Smoking status: Never Smoker   . Smokeless tobacco: Never Used  . Alcohol Use: No  . Drug Use: No  . Sexual Activity: Not on file   Other Topics Concern   . Not on file   Social History Narrative  . No narrative on file    Family History: History reviewed. No pertinent family history.  Review of Systems: Positive: None. Negative: Fever, SOB, or chest pain.  A further 10 point review of systems was negative except what is listed in the HPI.  Physical Exam: Filed Vitals:   01/25/13 0530  BP: 139/97  Pulse: 81  Temp: 98 F (36.7 C)  Resp: 16    General: No acute distress.  Awake. Head:  Normocephalic.  Atraumatic. ENT:  EOMI.  Mucous membranes moist Neck:  Supple.  No lymphadenopathy. CV:  S1 present. S2 present. Regular rate. Pulmonary: Equal effort bilaterally.  Clear to auscultation bilaterally. Abdomen: Soft.  Non- tender to palpation. Skin:  Normal turgor.  No visible rash. Extremity: No gross deformity of bilateral upper extremities.  No gross deformity of    bilateral lower extremities. Neurologic: Alert. Appropriate mood.  .  Studies:  No results found for this basename: HGB, WBC, PLT,  in the last 72 hours  No results found for this basename: NA, K, CL, CO2, BUN, CREATININE, CALCIUM, MAGNESIUM, GFRNONAA, GFRAA,  in the last 72 hours   No results found for this basename: PT, INR, APTT,  in the last 72 hours   No  components found with this basename: ABG,     Assessment:  Prostate cancer  Plan: To OR  for robotic assisted radical prostatectomy.

## 2013-01-25 NOTE — Progress Notes (Signed)
Post op check.  Patient doing well. Abdominal pain controlled w/ meds. Positive nausea. Negative emesis. Negative itching.  We discussed the course of the surgery and the surgical findings.  Filed Vitals:   01/25/13 1530  BP: 137/78  Pulse: 87  Temp: 97.5 F (36.4 C)  Resp:    Gen: NAD, AAO Abd: ND, soft, incisions dressed, JP serosanguinous. CV: RRR Chest: CTA-B Skin: diffuse erythematous rash seen after surgery almost completely resolved.  A/P: Prostate cancer. Radical prostatectomy. -Regular diet, though he may want to stick to liquids tonight. -Ambulate. -Heparin & SCDs. -Benadryl for rash if it returns.

## 2013-01-25 NOTE — Op Note (Signed)
DATE OF PROCEDURE: 01/25/13   OPERATIVE REPORT  SURGEON: Natalia Leatherwood, MD  ASSISTANT:  Pecola Leisure, PA   PREOPERATIVE DIAGNOSIS: Prostate cancer.  POSTOPERATIVE DIAGNOSIS: Prostate cancer.   PROCEDURE PERFORMED:  Robotic-assisted laparoscopic radical prostatectomy Limited bilateral nerve sparing. Lysis of bladder adhesions.     ESTIMATED BLOOD LOSS: 50 cc.  DRAINS: Jackson-Pratt drain, left lower quadrant and Foley catheter.   SPECIMEN: Prostate sent with seminal vesicles in its entirety. Periprostatic lymph nodes. Frozen section: left lateral prostate margin (x2), right lateral prostate margin, and margin of right prostate base/bladder neck.  FINDINGS:  Herniated bladder into the left inguinal region. Poor tissue plane between the right prostate base and bladder neck. Negative frozen margins sent from the left lateral prostate margin and right lateral prostate and right prostate base/bladder neck.   HISTORY OF PRESENT ILLNESS: 57 year old male was found to have prostate cancer on prostate biopsy performed for rising PSA. After evaluation and discussion of management options, he elected for surgical extirpation of his prostate.   PROCEDURE IN DETAIL: Informed consent was obtained. He received subcutaneous heparin for DVT prophylaxis before being taken to the OR. The patient was taken to the operating room, where he was placed in the supine position. IV antibiotics were infused and general anesthesia was induced. A time- out was performed, in which the correct patient, surgical site, and procedure were identified and agreed upon by the team. Hair was removed  from his abdomen and genitals and then he was placed in a dorsal  lithotomy position. All pertinent neurovascular pressure points were padded appropriately. His arms were tucked to the side using gel padding. After this, his abdomen and genitals were prepped and draped in the usual sterile fashion. A Foley catheter was placed  on the field, 10 cc of sterile water was placed into the balloon.   He was then placed in a Trendelenburg position. Access was gained for laparoscopic procedure by using the Hasson technique by cutting down to the fascia in the midline above the umbilicus. Once the peritoneum was accessed, a 10 mm  port was placed and abdomen was insufflated with carbon dioxide. Opening pressures were initially low so insufflation was continued. Next, markings were made for placement of the 1st & 3rd robotic arm ports in the usual areas with the ports being placed approximately 10 cm from the midline on either  side of camera (2nd) arm.  A 10 mm assistant port was placed on the far right lateral side of the abdomen. A 5 mm assistant port was placed between the right robotic (1st arm) and the camera port (2nd arm). The 4th robotic arm port was placed at the far left lateral side of the abdomen. All these were placed under direct visualization, and the robot was docked to the robotic ports.  After this was done, the urachal remnant was identified and divided and the bladder was  Dissected from the anterior wall of the abdomen. This was taken down to  the endopelvic fascia bilaterally and the perineum was split down to the  vas deferens bilaterally. It was noted that a portion of the bladder had herniated through the anterior abdominal wall on the right side. This was stuck and required lysis of adhesions to remove this portion of the bladder from the wall. No injury was noted to the bladder and this was complete. I removed the fat on the anterior prostate and the bladder neck and sent this for periprosthetic lymph nodes for a permanent section.  After this was done, the endopelvic fascia was incised  bilaterally and carried anterior and posteriorly bilaterally. The  puboprostatic ligaments were then divided and then the stapler device  was used to divide and ligate the dorsal venous complex.   After this was complete,  attention was turned back to the junction between the prostate and bladder neck.  Dissection was carried down between the prostate and the bladder until  the Foley catheter was encountered. It was deflated and then placed  through the dissection site and then anterior traction was placed by  grasping the catheter with the 4th robot arm. The remainder of the bladder neck  was then dissected out, and dissection was carried down posteriorly making sure to avoid reentry into the bladder until the seminal vesicles were encountered.   The seminal vesicles and vas deferens were then dissected out completely bilaterally. After  these were done, they were placed on traction anteriorly and blunt  dissection was carried out between the rectum and the prostate.   Limited nerve sparing was carried out on both sides as much of the tissue was adherent. In order to do this I proceeded as follows. Attention was turned to the right side.  It was evaluated and found to have a partially obliterated plane of dissection, but I felt that I could at least save some of the nerve tissue. Therefore a  nerve sparing technique was undertaken by incising medial to the neurovascular bundle and releasing it laterally.  After this was carried  out, the right prostatic pedicle was controlled with sharp dissection  and Hem-o-lok clips. Dissection was then carried around posterior to  the prostate to the previous dissected area between the rectum and prostate.  Attention was turned to the left side. It was evaluated and found to have a partially obliterated plane of dissection, but I felt that I could at least save some of the nerve tissue. Therefore a  nerve sparing technique was undertaken by incising medial to the neurovascular bundle and releasing it laterally. After this was carried  out, the left prostatic pedicle was controlled with sharp dissection  and Hem-o-lok clips. Dissection was then carried around posterior to  the  prostate to the previous dissected area between the rectum and prostate.  There was thick tissue between the right prostate base and the bladder neck. While dissecting out the pedicles were lysed I had dissected into the prostate while trying to avoid reentering the bladder. I went back and removed a large portion of this tissue and sent this for frozen section to evaluate whether this was prostatic tissue and whether there was cancer present. This returned positive for prostate tissue but negative for cancer. I then returned and removed more tissue at the bladder neck and sent this again. This returned as some prostate glans and negative prostate cancer. The remaining tissue appeared to be bladder neck tissue and I did not fill the appropriate to resect this further.  Dissection was then carried up to the urethra which was all that remained attaching the prostate to the body. The urethra was  then divided with scissors and the prostate was placed to the side.   The pelvis was irrigated and flushing of air into the rectal catheter showed no air bubbles.   Some of the tissue at the left lateral prostate margin which was left behind on the pedicle looked concerning for prostate tissue and therefore this was sent for frozen section; this returned negative for cancer or  prostate tissue. The same was done on a portion of the right prostate pedicle which returned negative for cancer.  I was able to visualize efflux from both ureter orifices. After this, anastomosis of the bladder neck to the urethra was carried out. A 3-0 V-lock suture was used to approximate the posterior bladder to the posterior urethra.   Double-armed Monocryl suture was used in a running fashion to perform the anastomosis. I was careful to ensure mucosal to mucosal approximation.  After this was done, a Foley catheter was placed through the anastomosis and into the bladder. It was irrigated and found to be water tight. It was tied down  and the suture needles were removed from the body.   A Jackson-Pratt drain was placed through the port of the 4th robot arm. The specimen was placed in the EndoCatch bag. The robot was  undocked and the operating table was leveled. The EndoCatch bag was removed from the body by enlarging  the midline of the umbilicus. All ports were checked and found to be  free of bleeding. The 10 mm assistant port was closed with a suture  passer under direct visualization. The fascia of the umbilical port was close with interrupted vicryl suture in a figure-of-eight fashion until there was no defect palpable. Care was taken to avoid incorporation of intra-abdominal contents into the closure. After this was done, the drain was sutured into place, and the local injection with Exparel was performed. All wounds were irrigated with  sterile normal saline and then closed with staples.  Wounds were then dressed with sterile dressing.   Tegaderm and drain gauze was placed over the Jackson-Pratt drain and  Foley catheter was placed to closed drainage. The patient was placed  back in supine position. Anesthesia was reversed. He was taken to PACU  in stable condition. He will be kept overnight for evaluation.   It was noted that he had erythematous rash overlying his torso, axilla, and groin after the drapes were removed. This appeared to be more of a contact dermatitis then an allergic reaction to systemically administered drugs.  All counts were correct at the end of the case.

## 2013-01-25 NOTE — Preoperative (Addendum)
Beta Blockers   Reason not to administer Beta Blockers:Not Applicable 

## 2013-01-25 NOTE — Anesthesia Postprocedure Evaluation (Signed)
  Anesthesia Post-op Note  Patient: Troy [name]  Procedure(s) Performed: Procedure(s) (LRB): ROBOTIC ASSISTED LAPAROSCOPIC RADICAL PROSTATECTOMY LEVEL 1 (N/A)  Patient Location: PACU  Anesthesia Type: General  Level of Consciousness: awake and alert   Airway and Oxygen Therapy: Patient Spontanous Breathing  Post-op Pain: mild  Post-op Assessment: Post-op Vital signs reviewed, Patient's Cardiovascular Status Stable, Respiratory Function Stable, Patent Airway and No signs of Nausea or vomiting  Last Vitals:  Filed Vitals:   01/25/13 1430  BP: 137/77  Pulse: 89  Temp:   Resp: 14    Post-op Vital Signs: stable   Complications: No apparent anesthesia complications

## 2013-01-25 NOTE — Progress Notes (Signed)
Dr. Margarita Grizzle in to speak with pt, checked abdomen; aware of pts rash and meds that were given; no additional orders re; rash

## 2013-01-26 ENCOUNTER — Encounter (HOSPITAL_COMMUNITY): Payer: Self-pay | Admitting: Urology

## 2013-01-26 LAB — BASIC METABOLIC PANEL
BUN: 11 mg/dL (ref 6–23)
Chloride: 103 mEq/L (ref 96–112)
GFR calc Af Amer: >90 mL/min (ref >90)
GFR calc non Af Amer: >90 mL/min (ref >90)
Potassium: 5 mEq/L (ref 3.5–5.1)
Sodium: 136 mEq/L (ref 135–145)

## 2013-01-26 LAB — HEMOGLOBIN AND HEMATOCRIT, BLOOD
HCT: 41.2 % (ref 39.0–52.0)
Hemoglobin: 14.1 g/dL (ref 13.0–17.0)

## 2013-01-26 MED ORDER — SENNOSIDES-DOCUSATE SODIUM 8.6-50 MG PO TABS: 1.0000 | ORAL_TABLET | Freq: Two times a day (BID) | ORAL | Status: DC

## 2013-01-26 MED ORDER — BISACODYL 10 MG RE SUPP: 10.0000 mg | Freq: Two times a day (BID) | RECTAL | Status: DC

## 2013-01-26 MED ORDER — SULFAMETHOXAZOLE-TMP DS 800-160 MG PO TABS: 1.0000 | ORAL_TABLET | Freq: Two times a day (BID) | ORAL | Status: DC

## 2013-01-26 MED ORDER — HYOSCYAMINE SULFATE 0.125 MG SL SUBL: 0.1250 mg | SUBLINGUAL_TABLET | SUBLINGUAL | Status: DC | PRN

## 2013-01-26 MED ORDER — OXYCODONE HCL 5 MG PO TABS: 5.0000 mg | ORAL_TABLET | ORAL | Status: DC | PRN

## 2013-01-26 NOTE — Progress Notes (Signed)
Pt had broke out with at rash on arm and upper abdomen within 30 after Cipro was started. Called MD ---NP on call, stopped Cipro infusing and gave pt Benedryl 25 mg tolerated well and  Rash decreased. SP, RN

## 2013-01-26 NOTE — Care Management Note (Signed)
    Page 1 of 1   01/26/2013     1:50:07 PM   CARE MANAGEMENT NOTE 01/26/2013  Patient:  Troy [name], Troy [name]   Account Number:  [id number]  Date Initiated:  01/26/2013  Documentation initiated by:  [facility]  Subjective/Objective Assessment:   56 Y/O M ADMITTED W/PROSTATE CA.     Action/Plan:   FROM HOME.   Anticipated DC Date:  01/26/2013   Anticipated DC Plan:  HOME/SELF CARE      DC Planning Services  CM consult      Choice offered to / List presented to:             Status of service:  Completed, signed off Medicare Important Message given?   (If response is "NO", the following Medicare IM given date fields will be blank) Date Medicare IM given:   Date Additional Medicare IM given:    Discharge Disposition:  HOME/SELF CARE  Per UR Regulation:  Reviewed for med. necessity/level of care/duration of stay  If discussed at Long Length of Stay Meetings, dates discussed:    Comments:

## 2013-01-26 NOTE — Discharge Summary (Signed)
Physician Discharge Summary  Patient ID: Troy [name] MRN: [id number] DOB/AGE: [birth year] 55 y.o.  Admit date: 01/25/2013 Discharge date: 01/26/2013  Admission Diagnoses:  Discharge Diagnoses:  Active Problems:   * No active hospital problems. *   Discharged Condition: good  Hospital Course:  Patient was kept in the hospital overnight per routine following robot radical prostatectomy. He was initially nauseous, but this resolved overnight. He was able to ambulate early and tolerate a regular diet. Pain was able to be controlled with pain medications. JP output was low and drain was discontinued POD#1. He was given discharge and wound care instructions as well as catheter care instructions. He was able to control his pain with oral medications. Positive flatus on POD#1.  Consults: None  Significant Diagnostic Studies: None  Treatments: IV hydration and surgery: Robotic radical prostatectomy.  Discharge Exam: Blood pressure 119/77, pulse 82, temperature 98 F (36.7 C), temperature source Oral, resp. rate 20, height 5\' 9"  (1.753 m), weight 76.2 kg (167 lb 15.9 oz), SpO2 98.00%. Refer to PE from progress note on date of discharge.  Disposition: Home: self care.     Medication List    STOP taking these medications       ADVIL 200 MG tablet  Generic drug:  ibuprofen     multivitamin with minerals Tabs tablet      TAKE these medications       bisacodyl 10 MG suppository  Commonly known as:  DULCOLAX  Place 1 suppository (10 mg total) rectally 2 (two) times daily.     cetirizine 10 MG tablet  Commonly known as:  ZYRTEC  Take 10 mg by mouth daily.     hyoscyamine 0.125 MG SL tablet  Commonly known as:  LEVSIN SL  Take 1 tablet (0.125 mg total) by mouth every 4 (four) hours as needed (bladder spasms).     oxyCODONE 5 MG immediate release tablet  Commonly known as:  Oxy IR/ROXICODONE  Take 1-2 tablets (5-10 mg total) by mouth every 4 (four) hours as needed for  pain.     REFRESH OPTIVE ADVANCED 0.5-1-0.5 % Soln  Generic drug:  Carboxymeth-Glycerin-Polysorb  Place 1 drop into both eyes every morning.     senna-docusate 8.6-50 MG per tablet  Commonly known as:  Senokot-S  Take 1 tablet by mouth 2 (two) times daily.     sulfamethoxazole-trimethoprim 800-160 MG per tablet  Commonly known as:  BACTRIM DS  Take 1 tablet by mouth 2 (two) times daily. Begin the day before your follow up appointment.           Follow-up Information   Follow up with Troy Cage, MD. (call office for appt date and time)    Specialty:  Urology   Contact information:   [address] [place] [address], [place] [address] [phone]       Signed: [name] 01/26/2013, 7:55 AM

## 2013-01-26 NOTE — Progress Notes (Signed)
RN, Jodi Marble, gave foley and leg bag instructions to patient and patient's wife.  Patient does not want to connect foley to leg bag and states " I am not going to be leaving my house and I do not want to use the leg bag."  Patient and wife verbalize understanding of instructions and do not have any questions at this time.

## 2013-01-26 NOTE — Progress Notes (Signed)
Urology Progress Note  Subjective:     No acute urologic events overnight. Had rash again w/ cirpo- relieved with benadryl. Nausea resolved. Ambulation:  [ x ] Positive  [  ] Negative Flatus:   [  ] Positive  [ x ] Negative Bowel movement [  ] Positive  [ x ] Negative  Pain: [ x ] Well controlled.  [  ] Needs adjustment.  ROS: Negative: Chest pain or SOB.  Objective:  Patient Vitals for the past 24 hrs:  BP Temp Temp src Pulse Resp SpO2 Height Weight  01/26/13 0700 119/77 mmHg 98 F (36.7 C) Oral 82 20 98 % - -  01/26/13 0157 127/73 mmHg 98.2 F (36.8 C) Oral 93 20 97 % - -  01/25/13 2130 141/83 mmHg 98.8 F (37.1 C) Oral 98 16 97 % - -  01/25/13 1837 146/79 mmHg 97.5 F (36.4 C) Oral 97 20 96 % - -  01/25/13 1530 137/78 mmHg 97.5 F (36.4 C) Oral 87 - 100 % 5\' 9"  (1.753 m) 76.2 kg (167 lb 15.9 oz)  01/25/13 1511 137/78 mmHg 97.5 F (36.4 C) - 87 16 100 % 5\' 9"  (1.753 m) 76.2 kg (167 lb 15.9 oz)  01/25/13 1445 - 97.9 F (36.6 C) - - - - - -  01/25/13 1430 137/77 mmHg - - 89 14 100 % - -  01/25/13 1415 126/77 mmHg - - 85 16 100 % - -  01/25/13 1400 125/73 mmHg - - 88 13 99 % - -  01/25/13 1345 134/95 mmHg 97.7 F (36.5 C) - 94 15 100 % - -    Physical Exam: General:  No acute distress, awake Cardiovascular:    [x]   S1/S2 present, RRR  []   Irregularly irregular Chest:  CTA-B Abdomen:               []  Soft, appropriately TTP  []  Soft, NTTP  [x]  Soft, appropriately TTP, incision(s) dressed without soakage. JP serosanguinous.  Genitourinary: Foley in place. Negative edema. Foley:  Clear yellow urine.    I/O last 3 completed shifts: In: 2350 [I.V.:1500; IV Piggyback:850] Out: 758 [Urine:700; Drains:8; Blood:50]  Recent Labs     01/25/13  1414  01/26/13  0512  HGB  15.1  14.1    Recent Labs     01/26/13  0512  NA  136  K  5.0  CL  103  CO2  22  BUN  11  CREATININE  0.99  CALCIUM  9.2  GFRNONAA  >90  GFRAA  >90     No results found for this  basename: PT, INR, APTT,  in the last 72 hours   No components found with this basename: ABG,     Length of stay: 1 days.  Assessment: Prostate cancer POD#1 Robotic radical prostatectomy   Plan: Dulcolax Regular diet Ambulate Likely discharge home today. We reviewed discharge meds and d/c instructions. D/c JP drain.   Natalia Leatherwood, MD [phone]

## 2013-01-26 NOTE — Progress Notes (Signed)
Update---Reddness decreased and rash on bil arms and abd area has improved. SP, RN

## 2014-01-26 ENCOUNTER — Ambulatory Visit (INDEPENDENT_AMBULATORY_CARE_PROVIDER_SITE_OTHER): Payer: 59 | Admitting: Internal Medicine

## 2014-01-26 ENCOUNTER — Other Ambulatory Visit: Payer: Self-pay

## 2014-01-26 ENCOUNTER — Encounter: Payer: Self-pay | Admitting: Internal Medicine

## 2014-01-26 VITALS — BP 146/88 | HR 75 | Temp 98.7°F | Ht 69.0 in | Wt 172.0 lb

## 2014-01-26 DIAGNOSIS — Z1211 Encounter for screening for malignant neoplasm of colon: Secondary | ICD-10-CM

## 2014-01-26 DIAGNOSIS — Z86010 Personal history of colonic polyps: Principal | ICD-10-CM

## 2014-01-26 MED ORDER — PEG-KCL-NACL-NASULF-NA ASC-C 100 G PO SOLR: 1.0000 | ORAL | Status: DC

## 2014-01-26 NOTE — Patient Instructions (Signed)
Schedule a surveillance colonoscopy - history of colonic adenoma  Split movie prep

## 2014-01-26 NOTE — Progress Notes (Signed)
Primary Care Physician:  Robert Bellow, MD Primary Gastroenterologist:  Dr. Gala Romney  Pre-Procedure History & Physical: HPI:  Troy [name] is a 58 y.o. male here for consideration of the surveillance colonoscopy. History of colonic adenoma removed at a relatively young age (29). He's had 2 negative  colonoscopies. Status post robotic radical prostatectomy last year. Doing very well.  He does not have any lower GI tract symptoms  -  change in bowel habits or bleeding. There is no family history of colorectal neoplasia.  Past Medical History  Diagnosis Date  . Seasonal allergies   . GERD (gastroesophageal reflux disease)     food related  . Arthritis     back  . Cancer     prostate cancer  . Herniated disc   . History of kidney stones   . Diverticula of colon     left side.    Past Surgical History  Procedure Laterality Date  . Lithotripsy      x2  . Hernia repair Left 2008  . Robot assisted laparoscopic radical prostatectomy N/A 01/25/2013    Procedure: ROBOTIC ASSISTED LAPAROSCOPIC RADICAL PROSTATECTOMY LEVEL 1;  Surgeon: Molli Hazard, MD;  Location: [facility];  Service: Urology;  Laterality: N/A;  . Colonoscopy  01/09/2008    Dr.Rourk- normal rectum, L sided diverticulum. colonic mucosa and terminal ileum mucosa appeared normal.  . Colonoscopy  1996    colonic adenomas  . Colonoscopy  2001    L sided diverticula    Prior to Admission medications   Medication Sig Start Date End Date Taking? Authorizing Provider  Carboxymeth-Glycerin-Polysorb (REFRESH OPTIVE ADVANCED) 0.5-1-0.5 % SOLN Place 1 drop into both eyes every morning.   Yes Historical Provider, MD  cetirizine (ZYRTEC) 10 MG tablet Take 10 mg by mouth daily.   Yes Historical Provider, MD  Ibuprofen (ADVIL PO) Take by mouth as needed.   Yes Historical Provider, MD  tadalafil (CIALIS) 5 MG tablet Take 5 mg by mouth as needed for erectile dysfunction.   Yes Historical Provider, MD    Allergies as of  01/26/2014 - Review Complete 01/26/2014  Allergen Reaction Noted  . Penicillins Other (See Comments) 07/10/2011  . Ciprofloxacin Rash 01/26/2013  . Darvocet [propoxyphene n-acetaminophen] Rash 07/10/2011  . Tylenol [acetaminophen] Rash 07/10/2011    No family history on file.  History   Social History  . Marital Status: Married    Spouse Name: N/A    Number of Children: N/A  . Years of Education: N/A   Occupational History  . Not on file.   Social History Main Topics  . Smoking status: Never Smoker   . Smokeless tobacco: Never Used  . Alcohol Use: No  . Drug Use: No  . Sexual Activity: Not on file   Other Topics Concern  . Not on file   Social History Narrative  . No narrative on file    Review of Systems: See HPI, otherwise negative ROS  Physical Exam: BP 146/88  Pulse 75  Temp(Src) 98.7 F (37.1 C) (Oral)  Ht 5\' 9"  (1.753 m)  Wt 172 lb (78.019 kg)  BMI 25.39 kg/m2 General:   Alert,  Well-developed, well-nourished, pleasant and cooperative in NAD Skin:  Intact without significant lesions or rashes. Eyes:  Sclera clear, no icterus.   Conjunctiva pink. Ears:  Normal auditory acuity. Nose:  No deformity, discharge,  or lesions. Mouth:  No deformity or lesions. Neck:  Supple; no masses or thyromegaly. No significant cervical adenopathy.  Lungs:  Clear throughout to auscultation.   No wheezes, crackles, or rhonchi. No acute distress. Heart:  Regular rate and rhythm; no murmurs, clicks, rubs,  or gallops. Abdomen: Non-distended, normal bowel sounds.  Soft and nontender without appreciable mass or hepatosplenomegaly.  Pulses:  Normal pulses noted. Extremities:  Without clubbing or edema.  Impression:  Pleasant 58 year old gentleman had distant history of colonic adenoma relatively young age. CT negative colonoscopy since that time. He can likely be put into average risk colorectal cancer screening. However, it's been a good almost 7 years since he had a colonoscopy  and he is paranoid about possibility of having polyps or other lesions in his colon. He wants to have a colonoscopy now. Given his current agel 36, this is not totally unreasonable. Therefore, will offer him a surveillance colonoscopy at this time.   Recommendations:  Schedule a surveillance colonoscopy;  The risks, benefits, limitations, alternatives and imponderables have been reviewed with the patient. Questions have been answered. All parties are agreeable.  - history of colonic adenoma.  Will use split movie prep.  Split movie prep    Notice: This dictation was prepared with Dragon dictation along with smaller phrase technology. Any transcriptional errors that result from this process are unintentional and may not be corrected upon review.

## 2014-01-31 ENCOUNTER — Encounter (HOSPITAL_COMMUNITY): Payer: Self-pay | Admitting: Pharmacy Technician

## 2014-02-07 NOTE — Progress Notes (Unsigned)
PA # is [id number]

## 2014-02-12 ENCOUNTER — Encounter (HOSPITAL_COMMUNITY)
Admission: RE | Disposition: A | Discharge status: Home / Self Care | Payer: Self-pay | Source: Ambulatory Visit | Attending: Internal Medicine

## 2014-02-12 ENCOUNTER — Encounter (HOSPITAL_COMMUNITY): Payer: Self-pay

## 2014-02-12 ENCOUNTER — Ambulatory Visit (HOSPITAL_COMMUNITY)
Admission: RE | Admit: 2014-02-12 | Discharge: 2014-02-12 | Disposition: A | Discharge status: Home / Self Care | Payer: 59 | Source: Ambulatory Visit | Attending: Internal Medicine | Admitting: Internal Medicine

## 2014-02-12 DIAGNOSIS — Z1211 Encounter for screening for malignant neoplasm of colon: Secondary | ICD-10-CM | POA: Diagnosis present

## 2014-02-12 DIAGNOSIS — Z9079 Acquired absence of other genital organ(s): Secondary | ICD-10-CM | POA: Insufficient documentation

## 2014-02-12 DIAGNOSIS — Z86010 Personal history of colonic polyps: Secondary | ICD-10-CM | POA: Insufficient documentation

## 2014-02-12 DIAGNOSIS — K5790 Diverticulosis of intestine, part unspecified, without perforation or abscess without bleeding: Secondary | ICD-10-CM | POA: Diagnosis not present

## 2014-02-12 DIAGNOSIS — Z8546 Personal history of malignant neoplasm of prostate: Secondary | ICD-10-CM | POA: Insufficient documentation

## 2014-02-12 DIAGNOSIS — K5730 Diverticulosis of large intestine without perforation or abscess without bleeding: Secondary | ICD-10-CM | POA: Insufficient documentation

## 2014-02-12 HISTORY — PX: COLONOSCOPY: SHX5424

## 2014-02-12 SURGERY — COLONOSCOPY
Anesthesia: Moderate Sedation

## 2014-02-12 MED ORDER — SODIUM CHLORIDE 0.9 % IV SOLN
INTRAVENOUS | Status: DC
  Administered 2014-02-12: 12:00:00 via INTRAVENOUS

## 2014-02-12 MED ORDER — ONDANSETRON HCL 4 MG/2ML IJ SOLN
INTRAMUSCULAR | Status: AC
  Filled 2014-02-12: qty 2

## 2014-02-12 MED ORDER — MEPERIDINE HCL 100 MG/ML IJ SOLN
INTRAMUSCULAR | Status: DC | PRN
  Administered 2014-02-12: 50 mg via INTRAVENOUS
  Administered 2014-02-12: 25 mg via INTRAVENOUS
  Administered 2014-02-12: 50 mg via INTRAVENOUS

## 2014-02-12 MED ORDER — MEPERIDINE HCL 100 MG/ML IJ SOLN
INTRAMUSCULAR | Status: AC
  Filled 2014-02-12: qty 2

## 2014-02-12 MED ORDER — MIDAZOLAM HCL 5 MG/5ML IJ SOLN
INTRAMUSCULAR | Status: AC
  Filled 2014-02-12: qty 10

## 2014-02-12 MED ORDER — STERILE WATER FOR IRRIGATION IR SOLN
Status: DC | PRN
  Administered 2014-02-12: 13:00:00

## 2014-02-12 MED ORDER — ONDANSETRON HCL 4 MG/2ML IJ SOLN
INTRAMUSCULAR | Status: DC | PRN
  Administered 2014-02-12: 4 mg via INTRAVENOUS

## 2014-02-12 MED ORDER — MIDAZOLAM HCL 5 MG/5ML IJ SOLN
INTRAMUSCULAR | Status: DC | PRN
  Administered 2014-02-12: 1 mg via INTRAVENOUS
  Administered 2014-02-12: 2 mg via INTRAVENOUS
  Administered 2014-02-12: 1 mg via INTRAVENOUS
  Administered 2014-02-12: 2 mg via INTRAVENOUS

## 2014-02-12 NOTE — Op Note (Signed)
[facility] [address] [place], [address]   COLONOSCOPY PROCEDURE REPORT  PATIENT: Troy [name], Troy [name]  MR#: [id number] BIRTHDATE: 03-19-1956 , 49  yrs. old GENDER: male ENDOSCOPIST: R.  Garfield Cornea, MD FACP [facility] REFERRED JO:INOMV Karie Kirks, M.D. PROCEDURE DATE:  02/22/2014 PROCEDURE:   Colonoscopy, surveillance INDICATIONS:History of colonic adenoma (distant). MEDICATIONS: Versed 6 mg IV and Demerol 125 mg IV in divided doses. Zofran 4 mg IV. ASA CLASS:  CONSENT: The risks, benefits, alternatives and imponderables including but not limited to bleeding, perforation as well as the possibility of a missed lesion have been reviewed.  The potential for biopsy, lesion removal, etc. have also been discussed. Questions have been answered.  All parties agreeable.  Please see the history and physical in the medical record for more information.  DESCRIPTION OF PROCEDURE:   After the risks benefits and alternatives of the procedure were thoroughly explained, informed consent was obtained.  The digital rectal exam      The EC-3890Li ([id number])  endoscope was introduced through the anus and advanced to the terminal ileum which was intubated for a short distance. No adverse events experienced.   The quality of the prep was adequate. The instrument was then slowly withdrawn as the colon was fully examined.      COLON FINDINGS: Minimal internal hemorrhoids; otherwise, normal rectum.  Scattered left-sided diverticula; the remainder of the colonic mucosa in the distal 10 cm of terminal ileum mucosa normal. Retroflexion was performed. .  Withdrawal time=8 minutes 0 seconds.  The scope was withdrawn and the procedure completed. COMPLICATIONS: There were no immediate complications.  ENDOSCOPIC IMPRESSION: Diverticulosis  RECOMMENDATIONS: Since he's had 3 negative colonoscopies over time, I feel we can put the patient back in standard average risk colorectal  cancer screening. Therefore, I recommend he return in 10 years for repeat screening colonoscopy.  eSigned:  R. Garfield Cornea, MD Rosalita Chessman [facility] 2014/02/22 1:51 PM   cc:  CPT CODES: ICD CODES:  The ICD and CPT codes recommended by this software are interpretations from the data that the clinical staff has captured with the software.  The verification of the translation of this report to the ICD and CPT codes and modifiers is the sole responsibility of the health care institution and practicing physician where this report was generated.  [employer] will not be held responsible for the validity of the ICD and CPT codes included on this report.  AMA assumes no liability for data contained or not contained herein. CPT is a Designer, television/film set of the [employer].  PATIENT NAME:  Ledarrius, Beauchaine MR#: [id number]

## 2014-02-12 NOTE — Interval H&P Note (Signed)
History and Physical Interval Note:  02/12/2014 1:13 PM  Troy [name]  has presented today for surgery, with the diagnosis of history of colonic adenoma  The various methods of treatment have been discussed with the patient and family. After consideration of risks, benefits and other options for treatment, the patient has consented to  Procedure(s) with comments: COLONOSCOPY (N/A) - 4315 as a surgical intervention .  The patient's history has been reviewed, patient examined, no change in status, stable for surgery.  I have reviewed the patient's chart and labs.  Questions were answered to the patient's satisfaction.    No change. Colonoscopy per plan.The risks, benefits, limitations, alternatives and imponderables have been reviewed with the patient. Questions have been answered. All parties are agreeable.   Manus Rudd

## 2014-02-12 NOTE — Discharge Instructions (Addendum)
Colonoscopy Discharge Instructions  Read the instructions outlined below and refer to this sheet in the next few weeks. These discharge instructions provide you with general information on caring for yourself after you leave the hospital. Your doctor may also give you specific instructions. While your treatment has been planned according to the most current medical practices available, unavoidable complications occasionally occur. If you have any problems or questions after discharge, call Dr. Gala Romney at [phone]. ACTIVITY  You may resume your regular activity, but move at a slower pace for the next 24 hours.   Take frequent rest periods for the next 24 hours.   Walking will help get rid of the air and reduce the bloated feeling in your belly (abdomen).   No driving for 24 hours (because of the medicine (anesthesia) used during the test).    Do not sign any important legal documents or operate any machinery for 24 hours (because of the anesthesia used during the test).  NUTRITION  Drink plenty of fluids.   You may resume your normal diet as instructed by your doctor.   Begin with a light meal and progress to your normal diet. Heavy or fried foods are harder to digest and may make you feel sick to your stomach (nauseated).   Avoid alcoholic beverages for 24 hours or as instructed.  MEDICATIONS  You may resume your normal medications unless your doctor tells you otherwise.  WHAT YOU CAN EXPECT TODAY  Some feelings of bloating in the abdomen.   Passage of more gas than usual.   Spotting of blood in your stool or on the toilet paper.  IF YOU HAD POLYPS REMOVED DURING THE COLONOSCOPY:  No aspirin products for 7 days or as instructed.   No alcohol for 7 days or as instructed.   Eat a soft diet for the next 24 hours.  FINDING OUT THE RESULTS OF YOUR TEST Not all test results are available during your visit. If your test results are not back during the visit, make an appointment  with your caregiver to find out the results. Do not assume everything is normal if you have not heard from your caregiver or the medical facility. It is important for you to follow up on all of your test results.  SEEK IMMEDIATE MEDICAL ATTENTION IF:  You have more than a spotting of blood in your stool.   Your belly is swollen (abdominal distention).   You are nauseated or vomiting.   You have a temperature over 101.   You have abdominal pain or discomfort that is severe or gets worse throughout the day.   Diverticulosis information provided  Repeat colonoscopy in 10 years for screening purposes Diverticulosis Diverticulosis is the condition that develops when small pouches (diverticula) form in the wall of your colon. Your colon, or large intestine, is where water is absorbed and stool is formed. The pouches form when the inside layer of your colon pushes through weak spots in the outer layers of your colon. CAUSES  No one knows exactly what causes diverticulosis. RISK FACTORS  Being older than 90. Your risk for this condition increases with age. Diverticulosis is rare in people younger than 40 years. By age 41, almost everyone has it.  Eating a low-fiber diet.  Being frequently constipated.  Being overweight.  Not getting enough exercise.  Smoking.  Taking over-the-counter pain medicines, like aspirin and ibuprofen. SYMPTOMS  Most people with diverticulosis do not have symptoms. DIAGNOSIS  Because diverticulosis often has no symptoms,  health care providers often discover the condition during an exam for other colon problems. In many cases, a health care provider will diagnose diverticulosis while using a flexible scope to examine the colon (colonoscopy). TREATMENT  If you have never developed an infection related to diverticulosis, you may not need treatment. If you have had an infection before, treatment may include:  Eating more fruits, vegetables, and grains.  Taking  a fiber supplement.  Taking a live bacteria supplement (probiotic).  Taking medicine to relax your colon. HOME CARE INSTRUCTIONS   Drink at least 6-8 glasses of water each day to prevent constipation.  Try not to strain when you have a bowel movement.  Keep all follow-up appointments. If you have had an infection before:  Increase the fiber in your diet as directed by your health care provider or dietitian.  Take a dietary fiber supplement if your health care provider approves.  Only take medicines as directed by your health care provider. SEEK MEDICAL CARE IF:   You have abdominal pain.  You have bloating.  You have cramps.  You have not gone to the bathroom in 3 days. SEEK IMMEDIATE MEDICAL CARE IF:   Your pain gets worse.  Yourbloating becomes very bad.  You have a fever or chills, and your symptoms suddenly get worse.  You begin vomiting.  You have bowel movements that are bloody or black. MAKE SURE YOU:  Understand these instructions.  Will watch your condition.  Will get help right away if you are not doing well or get worse. Document Released: 12/12/2003 Document Revised: 03/21/2013 Document Reviewed: 02/08/2013 [facility] Patient Information 2015 [place], [place]. This information is not intended to replace advice given to you by your health care provider. Make sure you discuss any questions you have with your health care provider.

## 2014-02-12 NOTE — Interval H&P Note (Signed)
History and Physical Interval Note:  02/12/2014 12:12 PM  Troy [name]  has presented today for surgery, with the diagnosis of history of colonic adenoma  The various methods of treatment have been discussed with the patient and family. After consideration of risks, benefits and other options for treatment, the patient has consented to  Procedure(s) with comments: COLONOSCOPY (N/A) - 1638 as a surgical intervention .  The patient's history has been reviewed, patient examined, [name] change in status, stable for surgery.  I have reviewed the patient's chart and labs.  Questions were answered to the patient's satisfaction.     Manus Rudd

## 2014-02-12 NOTE — H&P (View-Only) (Signed)
Primary Care Physician:  Mylan Lengyel Bellow, MD Primary Gastroenterologist:  Dr. Gala Romney  Pre-Procedure History & Physical: HPI:  Troy [name] is a 58 y.o. male here for consideration of the surveillance colonoscopy. History of colonic adenoma removed at a relatively young age (63). He's had 2 negative  colonoscopies. Status post robotic radical prostatectomy last year. Doing very well.  He does not have any lower GI tract symptoms  -  change in bowel habits or bleeding. There is no family history of colorectal neoplasia.  Past Medical History  Diagnosis Date  . Seasonal allergies   . GERD (gastroesophageal reflux disease)     food related  . Arthritis     back  . Cancer     prostate cancer  . Herniated disc   . History of kidney stones   . Diverticula of colon     left side.    Past Surgical History  Procedure Laterality Date  . Lithotripsy      x2  . Hernia repair Left 2008  . Robot assisted laparoscopic radical prostatectomy N/A 01/25/2013    Procedure: ROBOTIC ASSISTED LAPAROSCOPIC RADICAL PROSTATECTOMY LEVEL 1;  Surgeon: Molli Hazard, MD;  Location: [facility];  Service: Urology;  Laterality: N/A;  . Colonoscopy  01/09/2008    Dr.Skiler Olden- normal rectum, L sided diverticulum. colonic mucosa and terminal ileum mucosa appeared normal.  . Colonoscopy  1996    colonic adenomas  . Colonoscopy  2001    L sided diverticula    Prior to Admission medications   Medication Sig Start Date End Date Taking? Authorizing Provider  Carboxymeth-Glycerin-Polysorb (REFRESH OPTIVE ADVANCED) 0.5-1-0.5 % SOLN Place 1 drop into both eyes every morning.   Yes Historical Provider, MD  cetirizine (ZYRTEC) 10 MG tablet Take 10 mg by mouth daily.   Yes Historical Provider, MD  Ibuprofen (ADVIL PO) Take by mouth as needed.   Yes Historical Provider, MD  tadalafil (CIALIS) 5 MG tablet Take 5 mg by mouth as needed for erectile dysfunction.   Yes Historical Provider, MD    Allergies as of  01/26/2014 - Review Complete 01/26/2014  Allergen Reaction Noted  . Penicillins Other (See Comments) 07/10/2011  . Ciprofloxacin Rash 01/26/2013  . Darvocet [propoxyphene n-acetaminophen] Rash 07/10/2011  . Tylenol [acetaminophen] Rash 07/10/2011    No family history on file.  History   Social History  . Marital Status: Married    Spouse Name: N/A    Number of Children: N/A  . Years of Education: N/A   Occupational History  . Not on file.   Social History Main Topics  . Smoking status: Never Smoker   . Smokeless tobacco: Never Used  . Alcohol Use: No  . Drug Use: No  . Sexual Activity: Not on file   Other Topics Concern  . Not on file   Social History Narrative  . No narrative on file    Review of Systems: See HPI, otherwise negative ROS  Physical Exam: BP 146/88  Pulse 75  Temp(Src) 98.7 F (37.1 C) (Oral)  Ht 5\' 9"  (1.753 m)  Wt 172 lb (78.019 kg)  BMI 25.39 kg/m2 General:   Alert,  Well-developed, well-nourished, pleasant and cooperative in NAD Skin:  Intact without significant lesions or rashes. Eyes:  Sclera clear, no icterus.   Conjunctiva pink. Ears:  Normal auditory acuity. Nose:  No deformity, discharge,  or lesions. Mouth:  No deformity or lesions. Neck:  Supple; no masses or thyromegaly. No significant cervical adenopathy.  Lungs:  Clear throughout to auscultation.   No wheezes, crackles, or rhonchi. No acute distress. Heart:  Regular rate and rhythm; no murmurs, clicks, rubs,  or gallops. Abdomen: Non-distended, normal bowel sounds.  Soft and nontender without appreciable mass or hepatosplenomegaly.  Pulses:  Normal pulses noted. Extremities:  Without clubbing or edema.  Impression:  Pleasant 58 year old gentleman had distant history of colonic adenoma relatively young age. CT negative colonoscopy since that time. He can likely be put into average risk colorectal cancer screening. However, it's been a good almost 7 years since he had a colonoscopy  and he is paranoid about possibility of having polyps or other lesions in his colon. He wants to have a colonoscopy now. Given his current agel 84, this is not totally unreasonable. Therefore, will offer him a surveillance colonoscopy at this time.   Recommendations:  Schedule a surveillance colonoscopy;  The risks, benefits, limitations, alternatives and imponderables have been reviewed with the patient. Questions have been answered. All parties are agreeable.  - history of colonic adenoma.  Will use split movie prep.  Split movie prep    Notice: This dictation was prepared with Dragon dictation along with smaller phrase technology. Any transcriptional errors that result from this process are unintentional and may not be corrected upon review.

## 2014-02-13 ENCOUNTER — Encounter (HOSPITAL_COMMUNITY): Payer: Self-pay | Admitting: Internal Medicine

## 2014-05-10 ENCOUNTER — Ambulatory Visit (INDEPENDENT_AMBULATORY_CARE_PROVIDER_SITE_OTHER): Payer: 59 | Admitting: Orthopedic Surgery

## 2014-05-10 ENCOUNTER — Ambulatory Visit (INDEPENDENT_AMBULATORY_CARE_PROVIDER_SITE_OTHER): Payer: 59

## 2014-05-10 ENCOUNTER — Encounter: Payer: Self-pay | Admitting: Orthopedic Surgery

## 2014-05-10 VITALS — BP 152/107 | Ht 69.0 in | Wt 175.0 lb

## 2014-05-10 DIAGNOSIS — M79671 Pain in right foot: Secondary | ICD-10-CM

## 2014-05-10 DIAGNOSIS — M722 Plantar fascial fibromatosis: Secondary | ICD-10-CM

## 2014-05-10 NOTE — Progress Notes (Signed)
Chief Complaint  Patient presents with  . Foot Pain    Right heel pain, no injury.    Patient presents with several week history of pain in his right heel pain with early morning getting out of bed and pain with getting up after sitting. Pain is over the plantar aspect of the heel and is described as sharp dull throbbing with occasional burning. He did buy a counterforce brace which seemed to help he took some Advil but still having quite a bit of difficulty  His review of systems is negative except for some sinusitis seasonal allergies and back pain  Past Medical History  Diagnosis Date  . Seasonal allergies   . GERD (gastroesophageal reflux disease)     food related  . Arthritis     back  . Cancer     prostate cancer  . Herniated disc   . History of kidney stones   . Diverticula of colon     left side.   Past Surgical History  Procedure Laterality Date  . Lithotripsy      x2  . Hernia repair Left 2008  . Robot assisted laparoscopic radical prostatectomy N/A 01/25/2013    Procedure: ROBOTIC ASSISTED LAPAROSCOPIC RADICAL PROSTATECTOMY LEVEL 1;  Surgeon: Molli Hazard, MD;  Location: [facility];  Service: Urology;  Laterality: N/A;  . Colonoscopy  01/09/2008    Dr.Rourk- normal rectum, L sided diverticulum. colonic mucosa and terminal ileum mucosa appeared normal.  . Colonoscopy  1996    colonic adenomas  . Colonoscopy  2001    L sided diverticula  . Colonoscopy N/A 02/12/2014    Procedure: COLONOSCOPY;  Surgeon: Daneil Dolin, MD;  Location: [facility];  Service: Endoscopy;  Laterality: N/A;  1245   His examination reveals a well-developed well-nourished male vital signs are stable appearance is normal is oriented 3 mood is pleasant affect is normal gait is normal.  Right foot and ankle show normal pulse sensation skin, normal plantar flexion dorsiflexion strength. Ankle stable. Normal range of motion in the ankle. Mild palpable tenderness at the calcaneal  tuberosity  Achilles tendon nontender  Independent x-ray report read by me:  Radiology report  X-rays of the  RIGHT  foot  3 views of the foot are obtained  Pain is the primary complaint  The [facility] lateral and oblique views of the foot did not show fracture dislocation subluxation or degenerative arthritis  The impression is that the foot x-ray is normal  Encounter Diagnoses  Name Primary?  . Heel pain, right   . Plantar fasciitis Yes    Recommend plantar fascia injection and exercise program follow-up as needed  Procedure note  Injection  Verbal consent was obtained to inject the  plantar fascia right foot  Timeout procedure was completed to confirm injection site  Diagnosis right foot plantar fasciitis  Medications used Depo-Medrol 40 mg 1 cc Lidocaine 1% plain 3 cc  Anesthesia was provided by ethyl chloride spray  Prep was performed with alcohol  Technique of injection  25-gauge long needle used to perform the injection   No complications were noted

## 2014-05-10 NOTE — Patient Instructions (Addendum)
Plantar Fasciitis (Heel Spur Syndrome) with Rehab The plantar fascia is a fibrous, ligament-like, soft-tissue structure that spans the bottom of the foot. Plantar fasciitis is a condition that causes pain in the foot due to inflammation of the tissue. SYMPTOMS   Pain and tenderness on the underneath side of the foot.  Pain that worsens with standing or walking. CAUSES  Plantar fasciitis is caused by irritation and injury to the plantar fascia on the underneath side of the foot. Common mechanisms of injury include:  Direct trauma to bottom of the foot.  Damage to a small nerve that runs under the foot where the main fascia attaches to the heel bone.  Stress placed on the plantar fascia due to bone spurs. RISK INCREASES WITH:   Activities that place stress on the plantar fascia (running, jumping, pivoting, or cutting).  Poor strength and flexibility.  Improperly fitted shoes.  Tight calf muscles.  Flat feet.  Failure to warm-up properly before activity.  Obesity. PREVENTION  Warm up and stretch properly before activity.  Allow for adequate recovery between workouts.  Maintain physical fitness:  Strength, flexibility, and endurance.  Cardiovascular fitness.  Maintain a health body weight.  Avoid stress on the plantar fascia.  Wear properly fitted shoes, including arch supports for individuals who have flat feet. PROGNOSIS  If treated properly, then the symptoms of plantar fasciitis usually resolve without surgery. However, occasionally surgery is necessary. RELATED COMPLICATIONS   Recurrent symptoms that may result in a chronic condition.  Problems of the lower back that are caused by compensating for the injury, such as limping.  Pain or weakness of the foot during push-off following surgery.  Chronic inflammation, scarring, and partial or complete fascia tear, occurring more often from repeated injections. TREATMENT  Treatment initially involves the use of  ice and medication to help reduce pain and inflammation. The use of strengthening and stretching exercises may help reduce pain with activity, especially stretches of the Achilles tendon. These exercises may be performed at home or with a therapist. Your caregiver may recommend that you use heel cups of arch supports to help reduce stress on the plantar fascia. Occasionally, corticosteroid injections are given to reduce inflammation. If symptoms persist for greater than 6 months despite non-surgical (conservative), then surgery may be recommended.  MEDICATION   If pain medication is necessary, then nonsteroidal anti-inflammatory medications, such as aspirin and ibuprofen, or other minor pain relievers, such as acetaminophen, are often recommended.  Do not take pain medication within 7 days before surgery.  Prescription pain relievers may be given if deemed necessary by your caregiver. Use only as directed and only as much as you need.  Corticosteroid injections may be given by your caregiver. These injections should be reserved for the most serious cases, because they may only be given a certain number of times. HEAT AND COLD  Cold treatment (icing) relieves pain and reduces inflammation. Cold treatment should be applied for 10 to 15 minutes every 2 to 3 hours for inflammation and pain and immediately after any activity that aggravates your symptoms. Use ice packs or massage the area with a piece of ice (ice massage).  Heat treatment may be used prior to performing the stretching and strengthening activities prescribed by your caregiver, physical therapist, or athletic trainer. Use a heat pack or soak the injury in warm water. SEEK IMMEDIATE MEDICAL CARE IF:  Treatment seems to offer no benefit, or the condition worsens.  Any medications produce adverse side effects. EXERCISES hold  stretches for 2-3 seconds, repeat each exercise 10 times and do 3 sets of 10 daily  RANGE OF MOTION (ROM) AND  STRETCHING EXERCISES - Plantar Fasciitis (Heel Spur Syndrome) These exercises may help you when beginning to rehabilitate your injury. Your symptoms may resolve with or without further involvement from your physician, physical therapist or athletic trainer. While completing these exercises, remember:   Restoring tissue flexibility helps normal motion to return to the joints. This allows healthier, less painful movement and activity.  An effective stretch should be held for at least 30 seconds.  A stretch should never be painful. You should only feel a gentle lengthening or release in the stretched tissue. RANGE OF MOTION - Toe Extension, Flexion  Sit with your right / left leg crossed over your opposite knee.  Grasp your toes and gently pull them back toward the top of your foot. You should feel a stretch on the bottom of your toes and/or foot.  Hold this stretch for __________ seconds.  Now, gently pull your toes toward the bottom of your foot. You should feel a stretch on the top of your toes and or foot.  Hold this stretch for __________ seconds. Repeat __________ times. Complete this stretch __________ times per day.  RANGE OF MOTION - Ankle Dorsiflexion, Active Assisted  Remove shoes and sit on a chair that is preferably not on a carpeted surface.  Place right / left foot under knee. Extend your opposite leg for support.  Keeping your heel down, slide your right / left foot back toward the chair until you feel a stretch at your ankle or calf. If you do not feel a stretch, slide your bottom forward to the edge of the chair, while still keeping your heel down.  Hold this stretch for __________ seconds. Repeat __________ times. Complete this stretch __________ times per day.  STRETCH - Gastroc, Standing  Place hands on wall.  Extend right / left leg, keeping the front knee somewhat bent.  Slightly point your toes inward on your back foot.  Keeping your right / left heel on the  floor and your knee straight, shift your weight toward the wall, not allowing your back to arch.  You should feel a gentle stretch in the right / left calf. Hold this position for __________ seconds. Repeat __________ times. Complete this stretch __________ times per day. STRETCH - Soleus, Standing  Place hands on wall.  Extend right / left leg, keeping the other knee somewhat bent.  Slightly point your toes inward on your back foot.  Keep your right / left heel on the floor, bend your back knee, and slightly shift your weight over the back leg so that you feel a gentle stretch deep in your back calf.  Hold this position for __________ seconds. Repeat __________ times. Complete this stretch __________ times per day. STRETCH - Gastrocsoleus, Standing  Note: This exercise can place a lot of stress on your foot and ankle. Please complete this exercise only if specifically instructed by your caregiver.   Place the ball of your right / left foot on a step, keeping your other foot firmly on the same step.  Hold on to the wall or a rail for balance.  Slowly lift your other foot, allowing your body weight to press your heel down over the edge of the step.  You should feel a stretch in your right / left calf.  Hold this position for __________ seconds.  Repeat this exercise with a  slight bend in your right / left knee. Repeat __________ times. Complete this stretch __________ times per day.

## 2014-06-12 ENCOUNTER — Emergency Department (HOSPITAL_COMMUNITY): Payer: 59

## 2014-06-12 ENCOUNTER — Emergency Department (HOSPITAL_COMMUNITY)
Admission: EM | Admit: 2014-06-12 | Discharge: 2014-06-12 | Disposition: A | Discharge status: Home / Self Care | Payer: 59 | Attending: Emergency Medicine | Admitting: Emergency Medicine

## 2014-06-12 ENCOUNTER — Encounter (HOSPITAL_COMMUNITY): Payer: Self-pay | Admitting: Emergency Medicine

## 2014-06-12 DIAGNOSIS — Z87442 Personal history of urinary calculi: Secondary | ICD-10-CM | POA: Insufficient documentation

## 2014-06-12 DIAGNOSIS — Z8709 Personal history of other diseases of the respiratory system: Secondary | ICD-10-CM | POA: Diagnosis not present

## 2014-06-12 DIAGNOSIS — Z79899 Other long term (current) drug therapy: Secondary | ICD-10-CM | POA: Insufficient documentation

## 2014-06-12 DIAGNOSIS — Z880 Allergy status to penicillin: Secondary | ICD-10-CM | POA: Insufficient documentation

## 2014-06-12 DIAGNOSIS — R200 Anesthesia of skin: Secondary | ICD-10-CM | POA: Insufficient documentation

## 2014-06-12 DIAGNOSIS — R202 Paresthesia of skin: Secondary | ICD-10-CM | POA: Diagnosis present

## 2014-06-12 DIAGNOSIS — I447 Left bundle-branch block, unspecified: Secondary | ICD-10-CM | POA: Diagnosis not present

## 2014-06-12 DIAGNOSIS — Z8546 Personal history of malignant neoplasm of prostate: Secondary | ICD-10-CM | POA: Diagnosis not present

## 2014-06-12 DIAGNOSIS — Z8719 Personal history of other diseases of the digestive system: Secondary | ICD-10-CM | POA: Insufficient documentation

## 2014-06-12 DIAGNOSIS — Z8739 Personal history of other diseases of the musculoskeletal system and connective tissue: Secondary | ICD-10-CM | POA: Insufficient documentation

## 2014-06-12 LAB — CBC WITH DIFFERENTIAL/PLATELET
Basophils Absolute: 0 10*3/uL (ref 0.0–0.1)
Basophils Relative: 1 % (ref 0–1)
EOS PCT: 3 % (ref 0–5)
Eosinophils Absolute: 0.2 10*3/uL (ref 0.0–0.7)
HCT: 44.2 % (ref 39.0–52.0)
Hemoglobin: 15.7 g/dL (ref 13.0–17.0)
LYMPHS ABS: 2.3 10*3/uL (ref 0.7–4.0)
LYMPHS PCT: 38 % (ref 12–46)
MCH: 32 pg (ref 26.0–34.0)
MCHC: 35.5 g/dL (ref 30.0–36.0)
MCV: 90.2 fL (ref 78.0–100.0)
MONOS PCT: 8 % (ref 3–12)
Monocytes Absolute: 0.5 10*3/uL (ref 0.1–1.0)
Neutro Abs: 3.1 10*3/uL (ref 1.7–7.7)
Neutrophils Relative %: 50 % (ref 43–77)
PLATELETS: 182 10*3/uL (ref 150–400)
RBC: 4.9 MIL/uL (ref 4.22–5.81)
RDW: 12.6 % (ref 11.5–15.5)
WBC: 6.1 10*3/uL (ref 4.0–10.5)

## 2014-06-12 LAB — COMPREHENSIVE METABOLIC PANEL
ALT: 32 U/L (ref 0–53)
ANION GAP: 8 (ref 5–15)
AST: 28 U/L (ref 0–37)
Albumin: 4.7 g/dL (ref 3.5–5.2)
Alkaline Phosphatase: 75 U/L (ref 39–117)
BUN: 8 mg/dL (ref 6–23)
CO2: 27 mmol/L (ref 19–32)
Calcium: 9.7 mg/dL (ref 8.4–10.5)
Chloride: 106 mmol/L (ref 96–112)
Creatinine, Ser: 0.86 mg/dL (ref 0.50–1.35)
GFR calc non Af Amer: >90 mL/min (ref >90)
Glucose, Bld: 96 mg/dL (ref 70–99)
Potassium: 4.7 mmol/L (ref 3.5–5.1)
SODIUM: 141 mmol/L (ref 135–145)
Total Bilirubin: 0.8 mg/dL (ref 0.3–1.2)
Total Protein: 7.6 g/dL (ref 6.0–8.3)

## 2014-06-12 LAB — TROPONIN I: Troponin I: <0.03 ng/mL (ref <0.031)

## 2014-06-12 MED ORDER — HYDROCODONE-IBUPROFEN 5-200 MG PO TABS: 1.0000 | ORAL_TABLET | Freq: Three times a day (TID) | ORAL | Status: DC | PRN

## 2014-06-12 MED ORDER — PREDNISONE 10 MG PO TABS: 20.0000 mg | ORAL_TABLET | Freq: Two times a day (BID) | ORAL | Status: DC

## 2014-06-12 NOTE — ED Notes (Signed)
Discharge instructions given, pt demonstrated teach back and verbal understanding. No concerns voiced.  

## 2014-06-12 NOTE — ED Notes (Signed)
Having numbness and tingling to left arm.  Rates pain 8.  Denies SOB.

## 2014-06-12 NOTE — ED Provider Notes (Signed)
CSN: [id number]     Arrival date & time 06/12/14  1813 History   First MD Initiated Contact with Patient 06/12/14 1942     Chief Complaint  Patient presents with  . Arm Pain     (Consider location/radiation/quality/duration/timing/severity/associated sxs/prior Treatment) HPI Comments: Patient is a 59 year old male with past medical history of kidney stones, reflux. He presents for evaluation of tingling and discomfort in his left arm which she has been experiencing for the past 2 weeks. This began in the absence of any injury or trauma. He denies any chest pain, shortness of breath, diaphoresis. He denies to me he is experiencing any exertional symptoms. States the pain is pretty constant, however does wax and wane. There appears to be no aggravating or alleviating factors. He has no prior heart history but does have hypertension, high cholesterol, and a family history of heart disease.  Patient is a 59 y.o. male presenting with arm pain. The history is provided by the patient.  Arm Pain This is a new problem. Episode onset: 2 weeks ago. The problem occurs constantly. The problem has been gradually worsening. Pertinent negatives include no chest pain and no shortness of breath. Nothing aggravates the symptoms. Nothing relieves the symptoms. He has tried nothing for the symptoms. The treatment provided no relief.    Past Medical History  Diagnosis Date  . Seasonal allergies   . GERD (gastroesophageal reflux disease)     food related  . Arthritis     back  . Cancer     prostate cancer  . Herniated disc   . History of kidney stones   . Diverticula of colon     left side.   Past Surgical History  Procedure Laterality Date  . Lithotripsy      x2  . Hernia repair Left 2008  . Robot assisted laparoscopic radical prostatectomy N/A 01/25/2013    Procedure: ROBOTIC ASSISTED LAPAROSCOPIC RADICAL PROSTATECTOMY LEVEL 1;  Surgeon: Molli Hazard, MD;  Location: [facility];  Service:  Urology;  Laterality: N/A;  . Colonoscopy  01/09/2008    Dr.Rourk- normal rectum, L sided diverticulum. colonic mucosa and terminal ileum mucosa appeared normal.  . Colonoscopy  1996    colonic adenomas  . Colonoscopy  2001    L sided diverticula  . Colonoscopy N/A 02/12/2014    Procedure: COLONOSCOPY;  Surgeon: Daneil Dolin, MD;  Location: [facility];  Service: Endoscopy;  Laterality: N/A;  1245   History reviewed. No pertinent family history. History  Substance Use Topics  . Smoking status: Never Smoker   . Smokeless tobacco: Never Used  . Alcohol Use: No    Review of Systems  Respiratory: Negative for shortness of breath.   Cardiovascular: Negative for chest pain.  All other systems reviewed and are negative.     Allergies  Penicillins; Ciprofloxacin; Darvocet; and Tylenol  Home Medications   Prior to Admission medications   Medication Sig Start Date End Date Taking? Authorizing Provider  cetirizine (ZYRTEC) 10 MG tablet Take 10 mg by mouth daily.    Historical Provider, MD  ibuprofen (ADVIL,MOTRIN) 200 MG tablet Take 200 mg by mouth every 6 (six) hours as needed for moderate pain.    Historical Provider, MD  peg 3350 powder (MOVIPREP) 100 G SOLR Take 1 kit (200 g total) by mouth as directed. 01/26/14   Daneil Dolin, MD  tadalafil (CIALIS) 5 MG tablet Take 5 mg by mouth as needed for erectile dysfunction.  Historical Provider, MD   BP 155/84 mmHg  Pulse 77  Temp(Src) 98.8 F (37.1 C) (Oral)  Resp 16  Ht '5\' 9"'  (1.753 m)  Wt 170 lb (77.111 kg)  BMI 25.09 kg/m2  SpO2 99% Physical Exam  Constitutional: He is oriented to person, place, and time. He appears well-developed and well-nourished. No distress.  HENT:  Head: Normocephalic and atraumatic.  Neck: Normal range of motion. Neck supple.  Cardiovascular: Normal rate, regular rhythm and normal heart sounds.   No murmur heard. Pulmonary/Chest: Effort normal and breath sounds normal. No respiratory  distress. He has no wheezes.  Abdominal: Soft. Bowel sounds are normal. He exhibits no distension. There is no tenderness.  Musculoskeletal: Normal range of motion. He exhibits no edema.  Neurological: He is alert and oriented to person, place, and time.  Skin: Skin is warm and dry. He is not diaphoretic.  Nursing note and vitals reviewed.   ED Course  Procedures (including critical care time) Labs Review Labs Reviewed  TROPONIN I  CBC WITH DIFFERENTIAL/PLATELET  COMPREHENSIVE METABOLIC PANEL    Imaging Review Dg Chest Portable 1 View  06/12/2014   CLINICAL DATA:  Acute onset of numbness and tingling at the left arm. Initial encounter.  EXAM: PORTABLE CHEST - 1 VIEW  COMPARISON:  Chest radiograph performed 11/15/2012  FINDINGS: The lungs are well-aerated. Mild vascular congestion is noted. There is no evidence of focal opacification, pleural effusion or pneumothorax.  The cardiomediastinal silhouette is within normal limits. No acute osseous abnormalities are seen.  IMPRESSION: Mild vascular congestion noted; lungs remain grossly clear.   Electronically Signed   By: Garald Balding M.D.   On: 06/12/2014 19:36     EKG Interpretation   Date/Time:  Tuesday June 12 2014 19:08:00 EDT Ventricular Rate:  73 PR Interval:  171 QRS Duration: 136 QT Interval:  436 QTC Calculation: 480 R Axis:   29 Text Interpretation:  Sinus rhythm Left bundle branch block Confirmed by  DELOS  MD, Furious Chiarelli ([id number]) on 06/12/2014 7:53:18 PM      MDM   Final diagnoses:  None    Patient is a 59 year old male with history of hypertension and high cholesterol. He presents with a two-week history of left arm numbness and discomfort in the absence of any injury or trauma. He is having no associated chest pain or shortness of breath. There is no exertional component to his symptoms.  His workup reveals a negative troponin, however EKG does reveal a left bundle branch block which is new from 2010. I'm uncertain  as to the significance of this EKG finding, however I suspect this is been present for some time. His symptoms sound radicular in nature and I suspect are coming from his neck. I will treat him with prednisone and pain medication. Given the fact that his father passed away after heart surgery close to his age, I will arrange a follow-up appointment in the near future with cardiology. Anderson and to return if his symptoms worsen or change.    Veryl Speak, MD 06/12/14 2056

## 2014-06-12 NOTE — Discharge Instructions (Signed)
Prednisone as prescribed.  Vicoprofen as prescribed as needed for pain.  Follow-up with cardiology in the next few days. The contact information for the cardiology office has been provided in this discharge summary. Call to arrange this appointment.   Paresthesia Paresthesia is an abnormal burning or prickling sensation. This sensation is generally felt in the hands, arms, legs, or feet. However, it may occur in any part of the body. It is usually not painful. The feeling may be described as:  Tingling or numbness.  "Pins and needles."  Skin crawling.  Buzzing.  Limbs "falling asleep."  Itching. Most people experience temporary (transient) paresthesia at some time in their lives. CAUSES  Paresthesia may occur when you breathe too quickly (hyperventilation). It can also occur without any apparent cause. Commonly, paresthesia occurs when pressure is placed on a nerve. The feeling quickly goes away once the pressure is removed. For some people, however, paresthesia is a long-lasting (chronic) condition caused by an underlying disorder. The underlying disorder may be:  A traumatic, direct injury to nerves. Examples include a:  Broken (fractured) neck.  Fractured skull.  A disorder affecting the brain and spinal cord (central nervous system). Examples include:  Transverse myelitis.  Encephalitis.  Transient ischemic attack.  Multiple sclerosis.  Stroke.  Tumor or blood vessel problems, such as an arteriovenous malformation pressing against the brain or spinal cord.  A condition that damages the peripheral nerves (peripheral neuropathy). Peripheral nerves are not part of the brain and spinal cord. These conditions include:  Diabetes.  Peripheral vascular disease.  Nerve entrapment syndromes, such as carpal tunnel syndrome.  Shingles.  Hypothyroidism.  Vitamin B12 deficiencies.  Alcoholism.  Heavy metal poisoning (lead, arsenic).  Rheumatoid  arthritis.  Systemic lupus erythematosus. DIAGNOSIS  Your caregiver will attempt to find the underlying cause of your paresthesia. Your caregiver may:  Take your medical history.  Perform a physical exam.  Order various lab tests.  Order imaging tests. TREATMENT  Treatment for paresthesia depends on the underlying cause. HOME CARE INSTRUCTIONS  Avoid drinking alcohol.  You may consider massage or acupuncture to help relieve your symptoms.  Keep all follow-up appointments as directed by your caregiver. SEEK IMMEDIATE MEDICAL CARE IF:   You feel weak.  You have trouble walking or moving.  You have problems with speech or vision.  You feel confused.  You cannot control your bladder or bowel movements.  You feel numbness after an injury.  You faint.  Your burning or prickling feeling gets worse when walking.  You have pain, cramps, or dizziness.  You develop a rash. MAKE SURE YOU:  Understand these instructions.  Will watch your condition.  Will get help right away if you are not doing well or get worse. Document Released: 03/06/2002 Document Revised: 06/08/2011 Document Reviewed: 12/05/2010 [facility] Patient Information 2015 [place], [place]. This information is not intended to replace advice given to you by your health care provider. Make sure you discuss any questions you have with your health care provider.

## 2014-06-14 ENCOUNTER — Encounter: Payer: Self-pay | Admitting: Cardiology

## 2014-06-14 ENCOUNTER — Ambulatory Visit (INDEPENDENT_AMBULATORY_CARE_PROVIDER_SITE_OTHER): Payer: 59 | Admitting: Cardiology

## 2014-06-14 VITALS — BP 218/76 | HR 78 | Ht 69.0 in | Wt 172.0 lb

## 2014-06-14 DIAGNOSIS — I10 Essential (primary) hypertension: Secondary | ICD-10-CM

## 2014-06-14 DIAGNOSIS — R072 Precordial pain: Secondary | ICD-10-CM

## 2014-06-14 NOTE — Patient Instructions (Signed)
Your physician recommends that you schedule a follow-up appointment in: to be determined after echo     Your physician has requested that you have an echocardiogram. Echocardiography is a painless test that uses sound waves to create images of your heart. It provides your doctor with information about the size and shape of your heart and how well your heart's chambers and valves are working. This procedure takes approximately one hour. There are no restrictions for this procedure.    Your physician recommends that you continue on your current medications as directed. Please refer to the Current Medication list given to you today.     Thank you for choosing [facility] !         

## 2014-06-14 NOTE — Progress Notes (Signed)
Clinical Summary Troy [name] is a 59 y.o.male seen today as a new patient for the following medical problems.  1. Chest  pain  - left arm pain and tingling. Started approx 3 weeks ago. No chest pain. Can occur at rest or with activity. Not positional. No other associated symptoms. Lasts up to 10 minutes. - no DOE. Walks on treadmill 30 min 5 days a week. Also started using rowing machine.   - seen in ER 06/12/14 with left arm numbness and pain.  - trop neg x 1. CXR mild vascular congestion. EKG LBBB of unknown duration.   CAD risk factors: HTN, HL, father CABG in his 54s, no DM, no tobacco.  2. HTN - started on losartan 3 weeks ago - checks daily at home, typically around 120s-130s/80s  Past Medical History  Diagnosis Date  . Seasonal allergies   . GERD (gastroesophageal reflux disease)     food related  . Arthritis     back  . Cancer     prostate cancer  . Herniated disc   . History of kidney stones   . Diverticula of colon     left side.     Allergies  Allergen Reactions  . Penicillins Other (See Comments)    Numbness around mouth   . Ciprofloxacin Rash  . Darvocet [Propoxyphene N-Acetaminophen] Rash  . Tylenol [Acetaminophen] Rash     Current Outpatient Prescriptions  Medication Sig Dispense Refill  . cetirizine (ZYRTEC) 10 MG tablet Take 10 mg by mouth daily.    . hydrocodone-ibuprofen (VICOPROFEN) 5-200 MG per tablet Take 1 tablet by mouth every 8 (eight) hours as needed for pain. 15 tablet 0  . ibuprofen (ADVIL,MOTRIN) 200 MG tablet Take 200 mg by mouth every 6 (six) hours as needed for moderate pain.    . peg 3350 powder (MOVIPREP) 100 G SOLR Take 1 kit (200 g total) by mouth as directed. 1 kit 0  . predniSONE (DELTASONE) 10 MG tablet Take 2 tablets (20 mg total) by mouth 2 (two) times daily. 20 tablet 0  . tadalafil (CIALIS) 5 MG tablet Take 5 mg by mouth as needed for erectile dysfunction.     No current facility-administered medications for this  visit.     Past Surgical History  Procedure Laterality Date  . Lithotripsy      x2  . Hernia repair Left 2008  . Robot assisted laparoscopic radical prostatectomy N/A 01/25/2013    Procedure: ROBOTIC ASSISTED LAPAROSCOPIC RADICAL PROSTATECTOMY LEVEL 1;  Surgeon: Molli Hazard, MD;  Location: [facility];  Service: Urology;  Laterality: N/A;  . Colonoscopy  01/09/2008    Dr.Rourk- normal rectum, L sided diverticulum. colonic mucosa and terminal ileum mucosa appeared normal.  . Colonoscopy  1996    colonic adenomas  . Colonoscopy  2001    L sided diverticula  . Colonoscopy N/A 02/12/2014    Procedure: COLONOSCOPY;  Surgeon: Daneil Dolin, MD;  Location: [facility];  Service: Endoscopy;  Laterality: N/A;  1245     Allergies  Allergen Reactions  . Penicillins Other (See Comments)    Numbness around mouth   . Ciprofloxacin Rash  . Darvocet [Propoxyphene N-Acetaminophen] Rash  . Tylenol [Acetaminophen] Rash      No family history on file.   Social History Troy [name] reports that he has never smoked. He has never used smokeless tobacco. Troy [name] reports that he does not drink alcohol.   Review of Systems CONSTITUTIONAL: No  weight loss, fever, chills, weakness or fatigue.  HEENT: Eyes: No visual loss, blurred vision, double vision or yellow sclerae.No hearing loss, sneezing, congestion, runny nose or sore throat.  SKIN: No rash or itching.  CARDIOVASCULAR: per HPI RESPIRATORY: No shortness of breath, cough or sputum.  GASTROINTESTINAL: No anorexia, nausea, vomiting or diarrhea. No abdominal pain or blood.  GENITOURINARY: No burning on urination, no polyuria NEUROLOGICAL: No headache, dizziness, syncope, paralysis, ataxia, numbness or tingling in the extremities. No change in bowel or bladder control.  MUSCULOSKELETAL: No muscle, back pain, joint pain or stiffness.  LYMPHATICS: No enlarged nodes. No history of splenectomy.  PSYCHIATRIC: No history of depression or  anxiety.  ENDOCRINOLOGIC: No reports of sweating, cold or heat intolerance. No polyuria or polydipsia.  Marland Kitchen   Physical Examination p 78 bp 140/90 Wt 172 lbs BMI 25 Gen: resting comfortably, no acute distress HEENT: no scleral icterus, pupils equal round and reactive, no palptable cervical adenopathy,  CV: RRR, no m/r/g, no JVD, no carotid bruits Resp: Clear to auscultation bilaterally GI: abdomen is soft, non-tender, non-distended, normal bowel sounds, no hepatosplenomegaly MSK: extremities are warm, no edema.  Skin: warm, no rash Neuro:  no focal deficits Psych: appropriate affect     Assessment and Plan   1. Chest pain - somewhat atypical symptoms, however many risk factors including DM2, strong family hx, HTN, and HL and EKG shows LBBB - obtain echo, pending results likely obtain a Lexiscan MPI  2. HTN - at goal, continue current meds    Arnoldo Lenis, M.D.

## 2014-06-22 ENCOUNTER — Other Ambulatory Visit (HOSPITAL_COMMUNITY): Payer: 59

## 2014-06-22 ENCOUNTER — Telehealth: Payer: Self-pay

## 2014-06-22 ENCOUNTER — Ambulatory Visit (HOSPITAL_COMMUNITY)
Admission: RE | Admit: 2014-06-22 | Discharge: 2014-06-22 | Disposition: A | Discharge status: Home / Self Care | Payer: 59 | Source: Ambulatory Visit | Attending: Cardiology | Admitting: Cardiology

## 2014-06-22 DIAGNOSIS — R079 Chest pain, unspecified: Secondary | ICD-10-CM | POA: Diagnosis not present

## 2014-06-22 DIAGNOSIS — R072 Precordial pain: Secondary | ICD-10-CM

## 2014-06-22 NOTE — Telephone Encounter (Signed)
Pt notified apt scheduled mailed instructions to patient

## 2014-06-22 NOTE — Progress Notes (Signed)
  Echocardiogram 2D Echocardiogram has been performed.  Samuel Germany 06/22/2014, 10:10 AM

## 2014-06-22 NOTE — Telephone Encounter (Signed)
-----   Message from Arnoldo Lenis, MD sent at 06/22/2014  2:09 PM EDT ----- Echo looks good, heart function looks normal. Given his abnormal EKG we do need to evaluate him for blockages as well. Please order a Lexiscan for chest pain. He does not need to hold any meds  Zandra Abts MD

## 2014-06-25 ENCOUNTER — Other Ambulatory Visit (HOSPITAL_COMMUNITY): Payer: 59

## 2014-06-28 ENCOUNTER — Encounter (HOSPITAL_COMMUNITY): Payer: Self-pay

## 2014-06-28 ENCOUNTER — Encounter (HOSPITAL_COMMUNITY)
Admission: RE | Admit: 2014-06-28 | Discharge: 2014-06-28 | Disposition: A | Discharge status: Home / Self Care | Payer: 59 | Source: Ambulatory Visit | Attending: Cardiology | Admitting: Cardiology

## 2014-06-28 ENCOUNTER — Ambulatory Visit (HOSPITAL_COMMUNITY)
Admission: RE | Admit: 2014-06-28 | Discharge: 2014-06-28 | Disposition: A | Discharge status: Home / Self Care | Payer: 59 | Source: Ambulatory Visit | Attending: Cardiology | Admitting: Cardiology

## 2014-06-28 DIAGNOSIS — R072 Precordial pain: Secondary | ICD-10-CM | POA: Diagnosis not present

## 2014-06-28 DIAGNOSIS — M79602 Pain in left arm: Secondary | ICD-10-CM | POA: Insufficient documentation

## 2014-06-28 DIAGNOSIS — R079 Chest pain, unspecified: Secondary | ICD-10-CM | POA: Diagnosis not present

## 2014-06-28 MED ORDER — TECHNETIUM TC 99M SESTAMIBI - CARDIOLITE
30.0000 | Freq: Once | INTRAVENOUS | Status: AC | PRN
  Administered 2014-06-28: 11:00:00 30 via INTRAVENOUS

## 2014-06-28 MED ORDER — REGADENOSON 0.4 MG/5ML IV SOLN
INTRAVENOUS | Status: AC
  Administered 2014-06-28: 0.4 mg via INTRAVENOUS
  Filled 2014-06-28: qty 5

## 2014-06-28 MED ORDER — REGADENOSON 0.4 MG/5ML IV SOLN
0.4000 mg | Freq: Once | INTRAVENOUS | Status: AC | PRN
  Administered 2014-06-28: 0.4 mg via INTRAVENOUS

## 2014-06-28 MED ORDER — SODIUM CHLORIDE 0.9 % IJ SOLN
INTRAMUSCULAR | Status: AC
  Administered 2014-06-28: 10 mL via INTRAVENOUS
  Filled 2014-06-28: qty 3

## 2014-06-28 MED ORDER — TECHNETIUM TC 99M SESTAMIBI GENERIC - CARDIOLITE
10.0000 | Freq: Once | INTRAVENOUS | Status: AC | PRN
  Administered 2014-06-28: 10 via INTRAVENOUS

## 2014-06-28 MED ORDER — SODIUM CHLORIDE 0.9 % IJ SOLN
10.0000 mL | INTRAMUSCULAR | Status: DC | PRN
  Administered 2014-06-28: 10 mL via INTRAVENOUS
  Filled 2014-06-28: qty 10

## 2014-06-28 NOTE — Progress Notes (Signed)
Stress Lab Nurses Notes - Troy [name]  Troy [name] 06/28/2014 Reason for doing test: Left arm pain Type of test: Troy [name] Nurse performing test: Troy Halls, RN Nuclear Medicine Tech: Troy [name] Echo Tech: Not Applicable MD performing test: Troy Nails NP Family MD: Troy [name] Test explained and consent signed: Yes.   IV started: Saline lock flushed, No redness or edema and Saline lock started in radiology Symptoms: SOB Treatment/Intervention: None Reason test stopped: protocol completed After recovery IV was: Discontinued via X-ray tech and No redness or edema Patient to return to Nuc. Med at : 11:30 Patient discharged: Home Patient's Condition upon discharge was: stable Comments: During test BP 137/87 & HR 102.  Recovery BP 144/78 & HR 77.  Symptoms resolved in recovery.  Troy [name]

## 2014-07-27 ENCOUNTER — Ambulatory Visit (HOSPITAL_COMMUNITY)
Admission: RE | Admit: 2014-07-27 | Discharge: 2014-07-27 | Disposition: A | Discharge status: Home / Self Care | Payer: 59 | Source: Ambulatory Visit | Attending: Family Medicine | Admitting: Family Medicine

## 2014-07-27 ENCOUNTER — Other Ambulatory Visit (HOSPITAL_COMMUNITY): Payer: Self-pay | Admitting: Family Medicine

## 2014-07-27 DIAGNOSIS — M5412 Radiculopathy, cervical region: Secondary | ICD-10-CM

## 2014-07-27 DIAGNOSIS — M542 Cervicalgia: Secondary | ICD-10-CM | POA: Diagnosis present

## 2014-07-27 DIAGNOSIS — M4722 Other spondylosis with radiculopathy, cervical region: Secondary | ICD-10-CM | POA: Insufficient documentation

## 2014-08-03 ENCOUNTER — Other Ambulatory Visit (HOSPITAL_COMMUNITY): Payer: Self-pay | Admitting: Family Medicine

## 2014-08-03 DIAGNOSIS — M5412 Radiculopathy, cervical region: Secondary | ICD-10-CM

## 2014-08-10 ENCOUNTER — Other Ambulatory Visit: Payer: Self-pay | Admitting: Family Medicine

## 2014-08-10 ENCOUNTER — Other Ambulatory Visit (HOSPITAL_COMMUNITY): Payer: Self-pay | Admitting: Family Medicine

## 2014-08-10 DIAGNOSIS — M5412 Radiculopathy, cervical region: Secondary | ICD-10-CM

## 2014-08-14 ENCOUNTER — Ambulatory Visit (HOSPITAL_COMMUNITY)
Admission: RE | Admit: 2014-08-14 | Discharge: 2014-08-14 | Disposition: A | Discharge status: Home / Self Care | Payer: 59 | Source: Ambulatory Visit | Attending: Family Medicine | Admitting: Family Medicine

## 2014-08-14 DIAGNOSIS — M5412 Radiculopathy, cervical region: Secondary | ICD-10-CM | POA: Diagnosis not present

## 2014-08-15 ENCOUNTER — Ambulatory Visit
Admission: RE | Admit: 2014-08-15 | Discharge: 2014-08-15 | Disposition: A | Discharge status: Home / Self Care | Payer: 59 | Source: Ambulatory Visit | Attending: Family Medicine | Admitting: Family Medicine

## 2014-08-15 DIAGNOSIS — M5412 Radiculopathy, cervical region: Secondary | ICD-10-CM

## 2014-08-15 MED ORDER — TRIAMCINOLONE ACETONIDE 40 MG/ML IJ SUSP (RADIOLOGY)
60.0000 mg | Freq: Once | INTRAMUSCULAR | Status: AC
  Administered 2014-08-15: 60 mg via EPIDURAL

## 2014-08-15 MED ORDER — IOHEXOL 300 MG/ML  SOLN
1.0000 mL | Freq: Once | INTRAMUSCULAR | Status: AC | PRN
  Administered 2014-08-15: 1 mL via EPIDURAL

## 2014-08-15 NOTE — Discharge Instructions (Signed)

## 2014-09-03 ENCOUNTER — Other Ambulatory Visit: Payer: Self-pay | Admitting: Family Medicine

## 2014-09-03 DIAGNOSIS — M5412 Radiculopathy, cervical region: Secondary | ICD-10-CM

## 2014-09-05 ENCOUNTER — Ambulatory Visit
Admission: RE | Admit: 2014-09-05 | Discharge: 2014-09-05 | Disposition: A | Discharge status: Home / Self Care | Payer: 59 | Source: Ambulatory Visit | Attending: Family Medicine | Admitting: Family Medicine

## 2014-09-05 DIAGNOSIS — M5412 Radiculopathy, cervical region: Secondary | ICD-10-CM

## 2014-09-05 MED ORDER — TRIAMCINOLONE ACETONIDE 40 MG/ML IJ SUSP (RADIOLOGY)
60.0000 mg | Freq: Once | INTRAMUSCULAR | Status: AC
  Administered 2014-09-05: 60 mg via EPIDURAL

## 2014-09-05 MED ORDER — IOHEXOL 300 MG/ML  SOLN
1.0000 mL | Freq: Once | INTRAMUSCULAR | Status: AC | PRN
  Administered 2014-09-05: 1 mL via EPIDURAL

## 2014-09-05 NOTE — Discharge Instructions (Signed)

## 2014-09-05 NOTE — Progress Notes (Signed)
Work excuse written for wife.

## 2016-03-03 ENCOUNTER — Other Ambulatory Visit (HOSPITAL_COMMUNITY): Payer: Self-pay | Admitting: Family Medicine

## 2016-03-03 ENCOUNTER — Ambulatory Visit (HOSPITAL_COMMUNITY)
Admission: RE | Admit: 2016-03-03 | Discharge: 2016-03-03 | Disposition: A | Discharge status: Home / Self Care | Payer: 59 | Source: Ambulatory Visit | Attending: Family Medicine | Admitting: Family Medicine

## 2016-03-03 DIAGNOSIS — R059 Cough, unspecified: Secondary | ICD-10-CM

## 2016-03-03 DIAGNOSIS — R05 Cough: Secondary | ICD-10-CM | POA: Diagnosis present

## 2016-05-23 IMAGING — CR DG CHEST 1V PORT
1 series · 1 of 1 positions shown · non-contrast
Comparison: Chest radiograph performed 11/15/2012

CLINICAL DATA: Acute onset of numbness and tingling at the left
arm. Initial encounter.

EXAM:
PORTABLE CHEST - 1 VIEW

[portable]
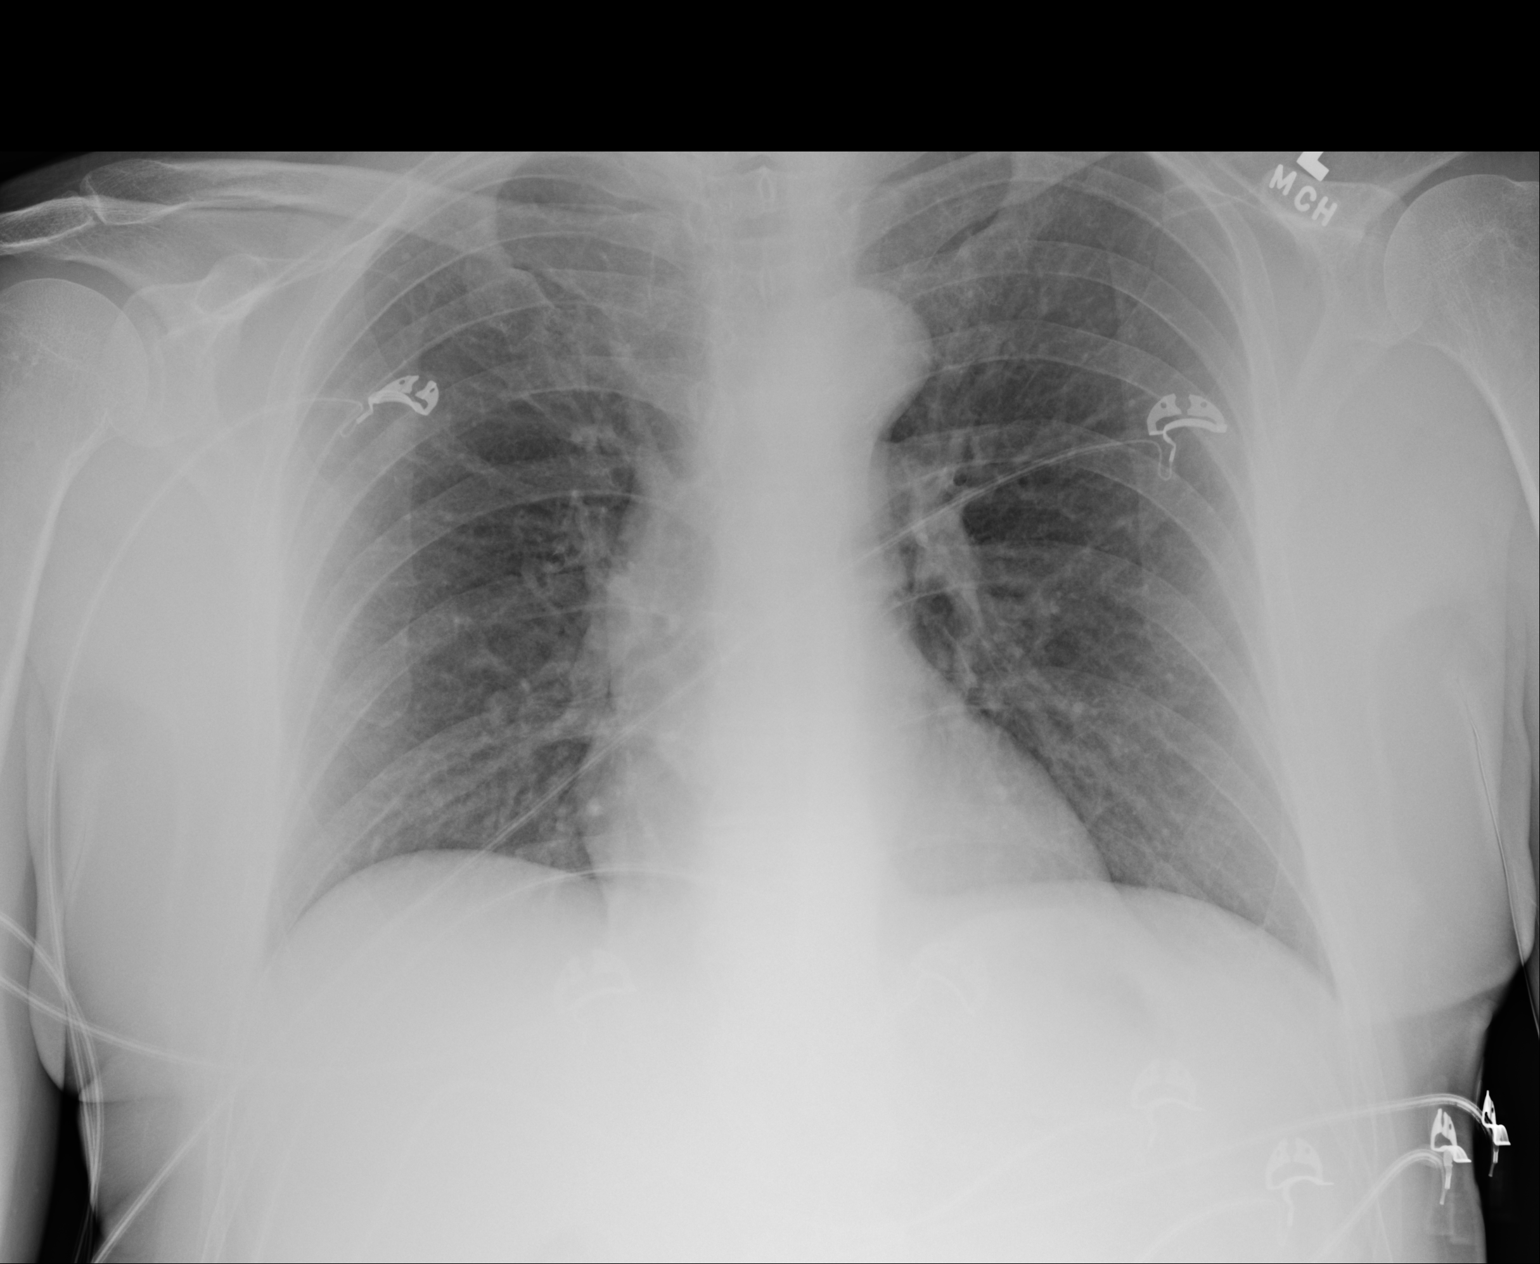

[1 of 1 positions shown; findings below may reference images not displayed]

FINDINGS: The lungs are well-aerated. Mild vascular congestion is noted. There
is no evidence of focal opacification, pleural effusion or
pneumothorax.

The cardiomediastinal silhouette is within normal limits. No acute
osseous abnormalities are seen.
IMPRESSION: Mild vascular congestion noted; lungs remain grossly clear.

## 2018-01-18 ENCOUNTER — Other Ambulatory Visit (HOSPITAL_COMMUNITY): Payer: Self-pay | Admitting: Nurse Practitioner

## 2018-01-18 DIAGNOSIS — R110 Nausea: Secondary | ICD-10-CM

## 2018-01-20 ENCOUNTER — Ambulatory Visit (HOSPITAL_COMMUNITY)
Admission: RE | Admit: 2018-01-20 | Discharge: 2018-01-20 | Disposition: A | Discharge status: Home / Self Care | Payer: 59 | Source: Ambulatory Visit | Attending: Nurse Practitioner | Admitting: Nurse Practitioner

## 2018-01-20 DIAGNOSIS — R110 Nausea: Secondary | ICD-10-CM | POA: Diagnosis not present

## 2018-01-20 DIAGNOSIS — N281 Cyst of kidney, acquired: Secondary | ICD-10-CM | POA: Insufficient documentation

## 2018-02-15 ENCOUNTER — Encounter: Payer: Self-pay | Admitting: Internal Medicine

## 2018-04-18 ENCOUNTER — Ambulatory Visit: Payer: 59 | Admitting: Gastroenterology

## 2018-05-18 ENCOUNTER — Emergency Department (HOSPITAL_COMMUNITY): Payer: 59

## 2018-05-18 ENCOUNTER — Encounter (HOSPITAL_COMMUNITY): Payer: Self-pay | Admitting: *Deleted

## 2018-05-18 ENCOUNTER — Other Ambulatory Visit: Payer: Self-pay

## 2018-05-18 ENCOUNTER — Observation Stay (HOSPITAL_COMMUNITY)
Admission: EM | Admit: 2018-05-18 | Discharge: 2018-05-20 | Disposition: A | Discharge status: Home / Self Care | Payer: 59 | Attending: Internal Medicine | Admitting: Internal Medicine

## 2018-05-18 DIAGNOSIS — E876 Hypokalemia: Secondary | ICD-10-CM

## 2018-05-18 DIAGNOSIS — Z8546 Personal history of malignant neoplasm of prostate: Secondary | ICD-10-CM | POA: Diagnosis not present

## 2018-05-18 DIAGNOSIS — A419 Sepsis, unspecified organism: Secondary | ICD-10-CM | POA: Diagnosis present

## 2018-05-18 DIAGNOSIS — R197 Diarrhea, unspecified: Secondary | ICD-10-CM

## 2018-05-18 DIAGNOSIS — E861 Hypovolemia: Secondary | ICD-10-CM

## 2018-05-18 DIAGNOSIS — E872 Acidosis, unspecified: Secondary | ICD-10-CM | POA: Diagnosis present

## 2018-05-18 DIAGNOSIS — T68XXXA Hypothermia, initial encounter: Secondary | ICD-10-CM | POA: Diagnosis present

## 2018-05-18 DIAGNOSIS — K219 Gastro-esophageal reflux disease without esophagitis: Secondary | ICD-10-CM | POA: Diagnosis present

## 2018-05-18 DIAGNOSIS — Z79899 Other long term (current) drug therapy: Secondary | ICD-10-CM | POA: Insufficient documentation

## 2018-05-18 DIAGNOSIS — R55 Syncope and collapse: Secondary | ICD-10-CM | POA: Diagnosis not present

## 2018-05-18 DIAGNOSIS — I9589 Other hypotension: Secondary | ICD-10-CM

## 2018-05-18 DIAGNOSIS — K529 Noninfective gastroenteritis and colitis, unspecified: Secondary | ICD-10-CM | POA: Diagnosis present

## 2018-05-18 DIAGNOSIS — I10 Essential (primary) hypertension: Secondary | ICD-10-CM | POA: Diagnosis not present

## 2018-05-18 LAB — LIPASE, BLOOD: Lipase: 31 U/L (ref 11–51)

## 2018-05-18 LAB — CBC WITH DIFFERENTIAL/PLATELET
Abs Immature Granulocytes: 0.04 10*3/uL (ref 0.00–0.07)
Basophils Absolute: 0 10*3/uL (ref 0.0–0.1)
Basophils Relative: 0 %
Eosinophils Absolute: 0.2 10*3/uL (ref 0.0–0.5)
Eosinophils Relative: 2 %
HCT: 42.9 % (ref 39.0–52.0)
Hemoglobin: 14.3 g/dL (ref 13.0–17.0)
Immature Granulocytes: 0 %
Lymphocytes Relative: 39 %
Lymphs Abs: 3.8 10*3/uL (ref 0.7–4.0)
MCH: 30 pg (ref 26.0–34.0)
MCHC: 33.3 g/dL (ref 30.0–36.0)
MCV: 89.9 fL (ref 80.0–100.0)
Monocytes Absolute: 0.6 10*3/uL (ref 0.1–1.0)
Monocytes Relative: 6 %
Neutro Abs: 5 10*3/uL (ref 1.7–7.7)
Neutrophils Relative %: 53 %
Platelets: 198 10*3/uL (ref 150–400)
RBC: 4.77 MIL/uL (ref 4.22–5.81)
RDW: 12.7 % (ref 11.5–15.5)
WBC: 9.7 10*3/uL (ref 4.0–10.5)
nRBC: 0 % (ref 0.0–0.2)

## 2018-05-18 LAB — COMPREHENSIVE METABOLIC PANEL
ALT: 37 U/L (ref 0–44)
AST: 31 U/L (ref 15–41)
Albumin: 3.5 g/dL (ref 3.5–5.0)
Alkaline Phosphatase: 53 U/L (ref 38–126)
Anion gap: 10 (ref 5–15)
BUN: 12 mg/dL (ref 6–20)
CO2: 22 mmol/L (ref 22–32)
Calcium: 8.1 mg/dL — ABNORMAL LOW (ref 8.9–10.3)
Chloride: 108 mmol/L (ref 98–111)
Creatinine, Ser: 1.12 mg/dL (ref 0.61–1.24)
GFR calc Af Amer: >60 mL/min (ref >60)
GFR calc non Af Amer: >60 mL/min (ref >60)
Glucose, Bld: 148 mg/dL — ABNORMAL HIGH (ref 70–99)
Potassium: 3 mmol/L — ABNORMAL LOW (ref 3.5–5.1)
Sodium: 140 mmol/L (ref 135–145)
Total Bilirubin: 1 mg/dL (ref 0.3–1.2)
Total Protein: 5.8 g/dL — ABNORMAL LOW (ref 6.5–8.1)

## 2018-05-18 LAB — LACTIC ACID, PLASMA
Lactic Acid, Venous: 3.1 mmol/L (ref 0.5–1.9)
Lactic Acid, Venous: 3.8 mmol/L (ref 0.5–1.9)

## 2018-05-18 LAB — TROPONIN I: Troponin I: <0.03 ng/mL (ref <0.03)

## 2018-05-18 MED ORDER — VANCOMYCIN HCL 10 G IV SOLR
1500.0000 mg | Freq: Once | INTRAVENOUS | Status: AC
  Administered 2018-05-18: 1500 mg via INTRAVENOUS
  Filled 2018-05-18 (×2): qty 1500

## 2018-05-18 MED ORDER — SODIUM CHLORIDE 0.9% FLUSH
3.0000 mL | Freq: Two times a day (BID) | INTRAVENOUS | Status: DC
  Administered 2018-05-19: 3 mL via INTRAVENOUS

## 2018-05-18 MED ORDER — SODIUM CHLORIDE 0.9 % IV BOLUS
1000.0000 mL | Freq: Once | INTRAVENOUS | Status: AC
  Administered 2018-05-19: 1000 mL via INTRAVENOUS

## 2018-05-18 MED ORDER — ONDANSETRON HCL 4 MG/2ML IJ SOLN: 4.0000 mg | Freq: Four times a day (QID) | INTRAMUSCULAR | Status: DC | PRN

## 2018-05-18 MED ORDER — SODIUM CHLORIDE 0.9 % IV BOLUS
2000.0000 mL | Freq: Once | INTRAVENOUS | Status: AC
  Administered 2018-05-18: 2000 mL via INTRAVENOUS

## 2018-05-18 MED ORDER — METRONIDAZOLE IN NACL 5-0.79 MG/ML-% IV SOLN
500.0000 mg | Freq: Once | INTRAVENOUS | Status: AC
  Administered 2018-05-19: 500 mg via INTRAVENOUS
  Filled 2018-05-18: qty 100

## 2018-05-18 MED ORDER — SODIUM CHLORIDE 0.9 % IV SOLN
2.0000 g | Freq: Once | INTRAVENOUS | Status: AC
  Administered 2018-05-18: 2 g via INTRAVENOUS
  Filled 2018-05-18: qty 2

## 2018-05-18 MED ORDER — POTASSIUM CHLORIDE CRYS ER 20 MEQ PO TBCR: 60.0000 meq | EXTENDED_RELEASE_TABLET | Freq: Once | ORAL | Status: DC

## 2018-05-18 MED ORDER — ONDANSETRON HCL 4 MG PO TABS: 4.0000 mg | ORAL_TABLET | Freq: Four times a day (QID) | ORAL | Status: DC | PRN

## 2018-05-18 MED ORDER — ONDANSETRON HCL 4 MG/2ML IJ SOLN
4.0000 mg | Freq: Once | INTRAMUSCULAR | Status: AC
  Administered 2018-05-18: 4 mg via INTRAVENOUS
  Filled 2018-05-18: qty 2

## 2018-05-18 MED ORDER — IOHEXOL 300 MG/ML  SOLN
100.0000 mL | Freq: Once | INTRAMUSCULAR | Status: AC | PRN
  Administered 2018-05-18: 100 mL via INTRAVENOUS

## 2018-05-18 NOTE — ED Notes (Signed)
Pt rectal temp 94.1, pt given blankets.

## 2018-05-18 NOTE — ED Notes (Signed)
CRITICAL VALUE ALERT  Critical Value:  Lactic acid 3.8  Date & Time Notied:  2347 05/18/18  Provider Notified: [email or web address]  Orders Received/Actions taken: md notified
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
## 2018-05-19 ENCOUNTER — Observation Stay (HOSPITAL_BASED_OUTPATIENT_CLINIC_OR_DEPARTMENT_OTHER): Payer: 59

## 2018-05-19 ENCOUNTER — Other Ambulatory Visit: Payer: Self-pay

## 2018-05-19 ENCOUNTER — Encounter (HOSPITAL_COMMUNITY): Payer: Self-pay | Admitting: Internal Medicine

## 2018-05-19 DIAGNOSIS — R55 Syncope and collapse: Secondary | ICD-10-CM | POA: Diagnosis not present

## 2018-05-19 DIAGNOSIS — K219 Gastro-esophageal reflux disease without esophagitis: Secondary | ICD-10-CM | POA: Diagnosis not present

## 2018-05-19 DIAGNOSIS — E876 Hypokalemia: Secondary | ICD-10-CM

## 2018-05-19 DIAGNOSIS — E861 Hypovolemia: Secondary | ICD-10-CM

## 2018-05-19 DIAGNOSIS — I350 Nonrheumatic aortic (valve) stenosis: Secondary | ICD-10-CM

## 2018-05-19 DIAGNOSIS — E872 Acidosis: Secondary | ICD-10-CM

## 2018-05-19 DIAGNOSIS — A419 Sepsis, unspecified organism: Secondary | ICD-10-CM | POA: Diagnosis present

## 2018-05-19 DIAGNOSIS — K529 Noninfective gastroenteritis and colitis, unspecified: Secondary | ICD-10-CM | POA: Diagnosis present

## 2018-05-19 DIAGNOSIS — I9589 Other hypotension: Secondary | ICD-10-CM

## 2018-05-19 HISTORY — DX: Nonrheumatic aortic (valve) stenosis: I35.0

## 2018-05-19 LAB — CBC WITH DIFFERENTIAL/PLATELET
Abs Immature Granulocytes: 0.05 10*3/uL (ref 0.00–0.07)
Basophils Absolute: 0 10*3/uL (ref 0.0–0.1)
Basophils Relative: 0 %
Eosinophils Absolute: 0.1 10*3/uL (ref 0.0–0.5)
Eosinophils Relative: 1 %
HCT: 41 % (ref 39.0–52.0)
Hemoglobin: 13.7 g/dL (ref 13.0–17.0)
Immature Granulocytes: 1 %
Lymphocytes Relative: 18 %
Lymphs Abs: 1.9 10*3/uL (ref 0.7–4.0)
MCH: 30.6 pg (ref 26.0–34.0)
MCHC: 33.4 g/dL (ref 30.0–36.0)
MCV: 91.7 fL (ref 80.0–100.0)
MONOS PCT: 6 %
Monocytes Absolute: 0.7 10*3/uL (ref 0.1–1.0)
Neutro Abs: 8 10*3/uL — ABNORMAL HIGH (ref 1.7–7.7)
Neutrophils Relative %: 74 %
Platelets: 172 10*3/uL (ref 150–400)
RBC: 4.47 MIL/uL (ref 4.22–5.81)
RDW: 13.1 % (ref 11.5–15.5)
WBC: 10.8 10*3/uL — ABNORMAL HIGH (ref 4.0–10.5)
nRBC: 0 % (ref 0.0–0.2)

## 2018-05-19 LAB — COMPREHENSIVE METABOLIC PANEL
ALT: 35 U/L (ref 0–44)
AST: 30 U/L (ref 15–41)
Albumin: 3.4 g/dL — ABNORMAL LOW (ref 3.5–5.0)
Alkaline Phosphatase: 53 U/L (ref 38–126)
Anion gap: 8 (ref 5–15)
BUN: 9 mg/dL (ref 6–20)
CHLORIDE: 112 mmol/L — AB (ref 98–111)
CO2: 19 mmol/L — ABNORMAL LOW (ref 22–32)
Calcium: 7.6 mg/dL — ABNORMAL LOW (ref 8.9–10.3)
Creatinine, Ser: 0.98 mg/dL (ref 0.61–1.24)
GFR calc Af Amer: >60 mL/min (ref >60)
Glucose, Bld: 129 mg/dL — ABNORMAL HIGH (ref 70–99)
Potassium: 4.7 mmol/L (ref 3.5–5.1)
Sodium: 139 mmol/L (ref 135–145)
Total Bilirubin: 1 mg/dL (ref 0.3–1.2)
Total Protein: 5.8 g/dL — ABNORMAL LOW (ref 6.5–8.1)

## 2018-05-19 LAB — TROPONIN I
Troponin I: 0.07 ng/mL (ref <0.03)
Troponin I: 0.06 ng/mL (ref <0.03)

## 2018-05-19 LAB — ECHOCARDIOGRAM COMPLETE
HEIGHTINCHES: 69 in
Weight: 2800 oz

## 2018-05-19 LAB — URINALYSIS, ROUTINE W REFLEX MICROSCOPIC
Bilirubin Urine: NEGATIVE
Glucose, UA: NEGATIVE mg/dL
HGB URINE DIPSTICK: NEGATIVE
Ketones, ur: NEGATIVE mg/dL
LEUKOCYTE UA: NEGATIVE
Nitrite: NEGATIVE
PROTEIN: NEGATIVE mg/dL
SPECIFIC GRAVITY, URINE: 1.016 (ref 1.005–1.030)
pH: 6 (ref 5.0–8.0)

## 2018-05-19 LAB — CBG MONITORING, ED: GLUCOSE-CAPILLARY: 117 mg/dL — AB (ref 70–99)

## 2018-05-19 LAB — PHOSPHORUS: Phosphorus: 4.1 mg/dL (ref 2.5–4.6)

## 2018-05-19 LAB — LACTIC ACID, PLASMA: Lactic Acid, Venous: 2.3 mmol/L (ref 0.5–1.9)

## 2018-05-19 LAB — MAGNESIUM: Magnesium: 2 mg/dL (ref 1.7–2.4)

## 2018-05-19 MED ORDER — SODIUM CHLORIDE 0.9 % IV SOLN
1.0000 g | Freq: Three times a day (TID) | INTRAVENOUS | Status: DC
  Administered 2018-05-19: 1 g via INTRAVENOUS
  Filled 2018-05-19: qty 1

## 2018-05-19 MED ORDER — FENTANYL CITRATE (PF) 100 MCG/2ML IJ SOLN
50.0000 ug | INTRAMUSCULAR | Status: DC | PRN
  Administered 2018-05-19 (×2): 50 ug via INTRAVENOUS
  Filled 2018-05-19 (×2): qty 2

## 2018-05-19 MED ORDER — POTASSIUM CHLORIDE IN NACL 40-0.9 MEQ/L-% IV SOLN
INTRAVENOUS | Status: AC
  Administered 2018-05-19: 125 mL/h via INTRAVENOUS
  Filled 2018-05-19 (×2): qty 1000

## 2018-05-19 MED ORDER — POTASSIUM CHLORIDE IN NACL 20-0.9 MEQ/L-% IV SOLN: INTRAVENOUS | Status: DC

## 2018-05-19 MED ORDER — POTASSIUM CHLORIDE IN NACL 40-0.9 MEQ/L-% IV SOLN
INTRAVENOUS | Status: AC
  Administered 2018-05-19: 100 mL/h via INTRAVENOUS

## 2018-05-19 MED ORDER — SODIUM CHLORIDE 0.9 % IV SOLN
3.0000 g | Freq: Three times a day (TID) | INTRAVENOUS | Status: DC
  Administered 2018-05-19 – 2018-05-20 (×4): 3 g via INTRAVENOUS
  Filled 2018-05-19 (×14): qty 3

## 2018-05-19 MED ORDER — METRONIDAZOLE IN NACL 5-0.79 MG/ML-% IV SOLN
500.0000 mg | Freq: Three times a day (TID) | INTRAVENOUS | Status: DC
  Administered 2018-05-19: 500 mg via INTRAVENOUS
  Filled 2018-05-19: qty 100

## 2018-05-19 MED ORDER — LORATADINE 10 MG PO TABS
10.0000 mg | ORAL_TABLET | Freq: Every day | ORAL | Status: DC
  Administered 2018-05-19 – 2018-05-20 (×2): 10 mg via ORAL
  Filled 2018-05-19 (×2): qty 1

## 2018-05-19 MED ORDER — POLYVINYL ALCOHOL 1.4 % OP SOLN
1.0000 [drp] | OPHTHALMIC | Status: DC | PRN
  Filled 2018-05-19: qty 15

## 2018-05-19 MED ORDER — SACCHAROMYCES BOULARDII 250 MG PO CAPS
250.0000 mg | ORAL_CAPSULE | Freq: Two times a day (BID) | ORAL | Status: DC
  Administered 2018-05-19 – 2018-05-20 (×2): 250 mg via ORAL
  Filled 2018-05-19 (×2): qty 1

## 2018-05-19 MED ORDER — ATORVASTATIN CALCIUM 20 MG PO TABS
20.0000 mg | ORAL_TABLET | Freq: Every day | ORAL | Status: DC
  Administered 2018-05-19 – 2018-05-20 (×2): 20 mg via ORAL
  Filled 2018-05-19: qty 2
  Filled 2018-05-19: qty 1

## 2018-05-19 NOTE — Progress Notes (Addendum)
Pharmacy Antibiotic Note  Troy [name] is a 63 y.o. male admitted on 05/18/2018 with acute colitis/sepsis.  Pharmacy has been consulted for Unasyn dosing.  Plan: Unasyn 3000 mg IV every 8 hours  Monitor labs, c/s, and patient improvement.  Patient has listed allergy to penicillin but does not indicate any severe allergy and likely false allergy.  Monitor for any allergic reaction.  Height: 5\' 9"  (175.3 cm) Weight: 175 lb (79.4 kg) IBW/kg (Calculated) : 70.7  Temp (24hrs), Avg:96.8 F (36 C), Min:94.1 F (34.5 C), Max:99.4 F (37.4 C)  Recent Labs  Lab 05/18/18 2103 05/18/18 2323 05/19/18 0343 05/19/18 0941  WBC 9.7  --   --  10.8*  CREATININE 1.12  --  0.98  --   LATICACIDVEN 3.1* 3.8* 2.3*  --     Estimated Creatinine Clearance: 80.2 mL/min (by C-G formula based on SCr of 0.98 mg/dL).    Allergies  Allergen Reactions  . Penicillins Other (See Comments)    Did it involve swelling of the face/tongue/throat, SOB, or low BP? No Did it involve sudden or severe rash/hives, skin peeling, or any reaction on the inside of your mouth or nose? No Did you need to seek medical attention at a hospital or doctor's office? No When did it last happen?Over 10 years If all above answers are "NO", may proceed with cephalosporin use.    Numbness around mouth   . Ciprofloxacin Rash  . Darvocet [Propoxyphene N-Acetaminophen] Rash  . Tylenol [Acetaminophen] Rash    Antimicrobials this admission: Unasyn 2/20  >>  Vanco/Cefepime 2/19 >> 2/20  Dose adjustments this admission: N/A  Microbiology results: 2/19 BCx: pending     Thank you for allowing pharmacy to be a part of this patient's care.  Ramond Craver 05/19/2018 11:05 AM
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
## 2018-05-19 NOTE — ED Notes (Signed)
Pts wifes number is [phone]

## 2018-05-19 NOTE — ED Notes (Signed)
CRITICAL VALUE ALERT  Critical Value:  Troponin 0.07  Date & Time Notied:  0448 05/19/18  Provider Notified: [email or web address]  Orders Received/Actions taken: md notified

## 2018-05-19 NOTE — ED Notes (Signed)
CRITICAL VALUE ALERT  Critical Value:  Lactic acid 2.3  Date & Time Notied:  05/19/2018 9507  Provider Notified: Dr. Olevia Bowens  Orders Received/Actions taken: See chart

## 2018-05-19 NOTE — Progress Notes (Signed)
*  PRELIMINARY RESULTS* Echocardiogram 2D Echocardiogram has been performed.  Samuel Germany 05/19/2018, 11:34 AM

## 2018-05-19 NOTE — ED Notes (Signed)
bair hugger applied to pt

## 2018-05-20 DIAGNOSIS — R197 Diarrhea, unspecified: Secondary | ICD-10-CM | POA: Diagnosis not present

## 2018-05-20 DIAGNOSIS — K219 Gastro-esophageal reflux disease without esophagitis: Secondary | ICD-10-CM | POA: Diagnosis not present

## 2018-05-20 DIAGNOSIS — K529 Noninfective gastroenteritis and colitis, unspecified: Secondary | ICD-10-CM | POA: Diagnosis not present

## 2018-05-20 DIAGNOSIS — E861 Hypovolemia: Secondary | ICD-10-CM

## 2018-05-20 DIAGNOSIS — R55 Syncope and collapse: Secondary | ICD-10-CM | POA: Diagnosis not present

## 2018-05-20 DIAGNOSIS — I9589 Other hypotension: Secondary | ICD-10-CM

## 2018-05-20 LAB — GASTROINTESTINAL PANEL BY PCR, STOOL (REPLACES STOOL CULTURE)

## 2018-05-20 LAB — BASIC METABOLIC PANEL
ANION GAP: 5 (ref 5–15)
BUN: 6 mg/dL (ref 6–20)
CO2: 23 mmol/L (ref 22–32)
Calcium: 8.3 mg/dL — ABNORMAL LOW (ref 8.9–10.3)
Chloride: 111 mmol/L (ref 98–111)
Creatinine, Ser: 0.83 mg/dL (ref 0.61–1.24)
GFR calc Af Amer: >60 mL/min (ref >60)
GFR calc non Af Amer: >60 mL/min (ref >60)
GLUCOSE: 93 mg/dL (ref 70–99)
Potassium: 3.8 mmol/L (ref 3.5–5.1)
Sodium: 139 mmol/L (ref 135–145)

## 2018-05-20 LAB — CBC
HCT: 37 % — ABNORMAL LOW (ref 39.0–52.0)
Hemoglobin: 12.6 g/dL — ABNORMAL LOW (ref 13.0–17.0)
MCH: 31.2 pg (ref 26.0–34.0)
MCHC: 34.1 g/dL (ref 30.0–36.0)
MCV: 91.6 fL (ref 80.0–100.0)
Platelets: 143 10*3/uL — ABNORMAL LOW (ref 150–400)
RBC: 4.04 MIL/uL — ABNORMAL LOW (ref 4.22–5.81)
RDW: 13.1 % (ref 11.5–15.5)
WBC: 8.2 10*3/uL (ref 4.0–10.5)
nRBC: 0 % (ref 0.0–0.2)

## 2018-05-20 LAB — LACTIC ACID, PLASMA: Lactic Acid, Venous: 0.9 mmol/L (ref 0.5–1.9)

## 2018-05-20 MED ORDER — SACCHAROMYCES BOULARDII 250 MG PO CAPS: 250.0000 mg | ORAL_CAPSULE | Freq: Two times a day (BID) | ORAL | 0 refills | Status: DC

## 2018-05-20 MED ORDER — AMOXICILLIN-POT CLAVULANATE 875-125 MG PO TABS: 1.0000 | ORAL_TABLET | Freq: Two times a day (BID) | ORAL | 0 refills | Status: AC

## 2018-05-20 MED ORDER — LOSARTAN POTASSIUM 25 MG PO TABS: 25.0000 mg | ORAL_TABLET | Freq: Every day | ORAL | Status: DC

## 2018-05-20 MED ORDER — IBUPROFEN 200 MG PO TABS: 400.0000 mg | ORAL_TABLET | Freq: Three times a day (TID) | ORAL | Status: DC | PRN
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
## 2018-05-20 NOTE — Progress Notes (Signed)
Iv removed and discharge instructions reviewed.  Scripts given.  Wife to drive home

## 2018-05-20 NOTE — Progress Notes (Signed)
Has had no bms today

## 2018-05-23 LAB — CULTURE, BLOOD (ROUTINE X 2)
Culture: NO GROWTH
Culture: NO GROWTH
Special Requests: ADEQUATE
Special Requests: ADEQUATE

## 2018-05-26 ENCOUNTER — Telehealth: Payer: Self-pay | Admitting: *Deleted

## 2018-05-26 ENCOUNTER — Encounter: Payer: Self-pay | Admitting: Gastroenterology

## 2018-05-26 ENCOUNTER — Ambulatory Visit: Payer: 59 | Admitting: Gastroenterology

## 2018-05-26 ENCOUNTER — Other Ambulatory Visit: Payer: Self-pay | Admitting: *Deleted

## 2018-05-26 ENCOUNTER — Encounter: Payer: Self-pay | Admitting: *Deleted

## 2018-05-26 VITALS — BP 135/82 | HR 81 | Temp 97.2°F | Ht 69.0 in | Wt 172.8 lb

## 2018-05-26 DIAGNOSIS — K529 Noninfective gastroenteritis and colitis, unspecified: Secondary | ICD-10-CM | POA: Diagnosis not present

## 2018-05-26 DIAGNOSIS — Z8719 Personal history of other diseases of the digestive system: Secondary | ICD-10-CM

## 2018-05-26 MED ORDER — NA SULFATE-K SULFATE-MG SULF 17.5-3.13-1.6 GM/177ML PO SOLN: 1.0000 | ORAL | 0 refills | Status: DC

## 2018-05-26 NOTE — Telephone Encounter (Signed)
PA for TCS approved via [facility] website. Auth# [id number] dates 07/27/2018-10/25/2018.

## 2018-05-26 NOTE — Progress Notes (Signed)
cc'ed to pcp °

## 2018-05-26 NOTE — Patient Instructions (Signed)
We are arranging a colonoscopy in 6-8 weeks.  Please call if any diarrhea. If you have persistent night sweats, please call us!  It was a pleasure to see you today. I strive to create trusting relationships with patients to provide genuine, compassionate, and quality care. I value your feedback. If you receive a survey regarding your visit,  I greatly appreciate you taking time to fill this out.   Annitta Needs, PhD, ANP-BC [facility]
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
## 2018-05-26 NOTE — Assessment & Plan Note (Signed)
Pleasant 63 year old male with recent hospitalization for acute colitis, negative GI pathogen panel, Cdiff not collected. Discharged with 10-day-course of Augmentin. He notes clinical improvement since hospitalization; however, he is noting night sweats since prior to acute illness that have persisted. Last colonoscopy in 2015 with diverticulosis, and he has a remote history of adenomas around age 53 but multiple colonoscopies thereafter without polyps. Currently, surveillance would be due in 2025; patient desires early interval colonoscopy, which is not unreasonable at this time. I have requested that he call us if persistent night sweats over the next week or any recurrence of symptoms.   Proceed with TCS with Dr. Gala Romney in near future: the risks, benefits, and alternatives have been discussed with the patient in detail. The patient states understanding and desires to proceed. Further recommendations to follow Refer to Hem/Onc if persistent night sweats  Continue probiotic

## 2018-05-27 ENCOUNTER — Ambulatory Visit: Payer: 59 | Admitting: Internal Medicine

## 2018-06-02 ENCOUNTER — Telehealth: Payer: Self-pay | Admitting: Internal Medicine

## 2018-06-02 DIAGNOSIS — R61 Generalized hyperhidrosis: Secondary | ICD-10-CM

## 2018-06-02 NOTE — Telephone Encounter (Signed)
Lmom, waiting on a return call.  

## 2018-06-02 NOTE — Telephone Encounter (Signed)
AB spoke with pt. He wanted to give an update of his colitis. He was seen in our office 05/26/18  s/p hospital visit. Pt's diarrhea has improved. He's still having night sweats and chills. Pt checks his temp when he has the night sweats and his temp is always around 97.

## 2018-06-02 NOTE — Telephone Encounter (Signed)
Pt was calling to give a phone report on how he is doing. He said that he was still having night sweats. Please call him back at [phone]

## 2018-06-02 NOTE — Telephone Encounter (Signed)
Pt was returning a call. I told him AM was on another line. He said he would call back around 130pm

## 2018-06-03 NOTE — Telephone Encounter (Signed)
Referral sent to hem/onc via Epic.

## 2018-06-03 NOTE — Telephone Encounter (Signed)
Thanks for update. We need to refer him to Hem/Onc due to persistent night sweats. Routing to [facility] clinical pool as well to arrange referral.

## 2018-06-03 NOTE — Telephone Encounter (Signed)
Pt notified of the ref for Hem/Onc.

## 2018-06-03 NOTE — Addendum Note (Signed)
Addended by: Hassan Rowan on: 06/03/2018 08:25 AM   Modules accepted: Orders

## 2018-06-09 ENCOUNTER — Other Ambulatory Visit: Payer: Self-pay

## 2018-06-09 ENCOUNTER — Encounter (HOSPITAL_COMMUNITY): Payer: Self-pay | Admitting: Hematology

## 2018-06-09 ENCOUNTER — Inpatient Hospital Stay (HOSPITAL_COMMUNITY): Payer: 59 | Attending: Hematology | Admitting: Hematology

## 2018-06-09 VITALS — BP 141/89 | HR 77 | Resp 20 | Ht 67.25 in | Wt 174.4 lb

## 2018-06-09 DIAGNOSIS — C61 Malignant neoplasm of prostate: Secondary | ICD-10-CM | POA: Diagnosis not present

## 2018-06-09 DIAGNOSIS — R61 Generalized hyperhidrosis: Secondary | ICD-10-CM | POA: Insufficient documentation

## 2018-06-09 DIAGNOSIS — Z9079 Acquired absence of other genital organ(s): Secondary | ICD-10-CM | POA: Diagnosis not present

## 2018-06-09 NOTE — Patient Instructions (Addendum)
[place] at [facility] Discharge Instructions  You were seen today by Dr. Delton Coombes. He went over how you've been feeling lately. He will order you a CT scan as well as lab work. He will see you back after your CT scan for follow up.    Thank you for choosing [place] at [facility] to provide your oncology and hematology care.  To afford each patient quality time with our provider, please arrive at least 15 minutes before your scheduled appointment time.   If you have a lab appointment with the [facility] please come in thru the  Main Entrance and check in at the main information desk  You need to re-schedule your appointment should you arrive 10 or more minutes late.  We strive to give you quality time with our providers, and arriving late affects you and other patients whose appointments are after yours.  Also, if you no show three or more times for appointments you may be dismissed from the clinic at the providers discretion.     Again, thank you for choosing [facility].  Our hope is that these requests will decrease the amount of time that you wait before being seen by our physicians.       _____________________________________________________________  Should you have questions after your visit to [facility], please contact our office at [phone] between the hours of 8:00 a.m. and 4:30 p.m.  Voicemails left after 4:00 p.m. will not be returned until the following business day.  For prescription refill requests, have your pharmacy contact our office and allow 72 hours.    Cancer Center Support Programs:   > Cancer Support Group  2nd Tuesday of the month 1pm-2pm, Journey Room

## 2018-06-09 NOTE — Assessment & Plan Note (Signed)
1.  Night sweats: - Patient was admitted to the hospital from 05/18/2018 through 05/20/2018 for syncopal episode.  2 days prior to that, he started having diarrhea and abdominal cramping and was dehydrated.  Diarrhea stopped 1 day after hospitalization.  Stool work-up was negative.  C. difficile was not done. - CT of the abdomen and pelvis on 05/18/2018 showed mild colitis from distal transverse colon through sigmoid colon with wall thickening but no mesenteric inflammation.  Spleen and liver was normal.  Punctate left nephrolithiasis. -She was discharged home on Augmentin for 10 days. -Last colonoscopy on 02/12/2014 shows diverticulosis.  He is scheduled for a colonoscopy on 07/27/2018 by Dr. Gala Romney. - Denies any fevers or weight loss.  He started having night sweats from 05/21/2018 and had about 3 episodes of drenching night sweats.  Otherwise he had mild night sweats every night when his T-shirt feels wet/damp when he wakes up mostly around the neck and upper chest. -He also had occasional diarrhea with left upper quadrant cramps for the last 10 days.  Also reported history of nausea, vomiting and diarrhea from June through October 2019. -He has 2 pets but denies any tick bites.  No herbal/over-the-counter medications. -Family history significant for father and paternal uncle with prostate cancer in 2 paternal aunts with cancer, 1 of whom had kidney cancer. - We will do infectious work-up including HIV.  Past cultures during hospitalization from blood were negative.  Denies any travel or tick bites.  Will check CRP, LDH, serum chromogranin levels.  We will also check testosterone level.  24-hour urine will be checked for 5 HIAA, metanephrines and catecholamines as work-up for neuroendocrine syndromes. -If the above work-up is nonconclusive, will consider bone marrow aspiration and biopsy.  2.  Prostate cancer: - He underwent robotic prostatectomy on 01/25/2013, Gleason (3+4 is equal to 7). - pT2c/P T3a.   He follows up with Dr. Tresa Moore at [facility] urology. -We will send a PSA level today.

## 2018-06-09 NOTE — Progress Notes (Signed)
CONSULT NOTE  Patient Care Team: Lemmie Evens, MD as PCP - General (Family Medicine) Harl Bowie, Alphonse Guild, MD as Consulting Physician (Cardiology)  CHIEF COMPLAINTS/PURPOSE OF CONSULTATION:  Night sweats, abdominal cramping  HISTORY OF PRESENTING ILLNESS:  Troy [name] 63 y.o. male is here because of night sweats, and cramping on the left side. He is here today with his wife. He gave a history of what's been going on for the last few weeks.  He states that he has noticed the cramps more in the last 10 days, everyday. He states that the night sweats after he was discharged from the hospital. He states that he has night sweats every night but severe night sweats have happened about three times since the 21st. he was hospitalized from 05/18/2018 through 05/20/2018 for syncopal episode followed by Augmentin for 10 days upon discharge.  He states that they do have pets at home. He denies any recent tick bites. He denies any fever or recent weight loss. He denies any use of herbal supplements.He denies recent chest pain on exertion, shortness of breath on minimal exertion, pre-syncopal episodes, or palpitations.The patient denies over the counter NSAID ingestion.  His last colonoscopy was in 2015   MEDICAL HISTORY:  Past Medical History:  Diagnosis Date  . Arthritis    back  . Cancer [facility])    prostate cancer  . Diverticula of colon    left side.  Marland Kitchen GERD (gastroesophageal reflux disease)    food related  . Herniated disc    3 herniated discs in neck  . History of kidney stones   . Seasonal allergies     SURGICAL HISTORY: Past Surgical History:  Procedure Laterality Date  . COLONOSCOPY  01/09/2008   Dr.Rourk- normal rectum, L sided diverticulum. colonic mucosa and terminal ileum mucosa appeared normal.  . COLONOSCOPY  1996   colonic adenomas  . COLONOSCOPY  2001   L sided diverticula  . COLONOSCOPY N/A 02/12/2014   Dr. Gala Romney: diverticulosis  . HERNIA REPAIR Left 2008  .  LITHOTRIPSY     x2  . NECK SURGERY    . ROBOT ASSISTED LAPAROSCOPIC RADICAL PROSTATECTOMY N/A 01/25/2013   Procedure: ROBOTIC ASSISTED LAPAROSCOPIC RADICAL PROSTATECTOMY LEVEL 1;  Surgeon: Molli Hazard, MD;  Location: [facility];  Service: Urology;  Laterality: N/A;    SOCIAL HISTORY: Social History   Socioeconomic History  . Marital status: Married    Spouse name: Haleem Hanner  . Number of children: 2  . Years of education: Not on file  . Highest education level: Not on file  Occupational History  . 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 Financial resource strain: Somewhat hard  . Food insecurity:    Worry: Never true    Inability: Never true  . Transportation needs:    Medical: No    Non-medical: No  Tobacco Use  . Smoking status: Never Smoker  . Smokeless tobacco: Never Used  Substance and Sexual Activity  . Alcohol use: No  . Drug use: No  . Sexual activity: Not on file  Lifestyle  . Physical activity:    Days per week: 0 days    Minutes per session: 0 min  . Stress: Only a little  Relationships  . Social connections:    Talks on phone: More than three times a week    Gets together: More than three times a week    Attends religious service: 1 to  4 times per year    Active member of club or organization: No    Attends meetings of clubs or organizations: Never    Relationship status: Married  . Intimate partner violence:    Fear of current or ex partner: No    Emotionally abused: No    Physically abused: No    Forced sexual activity: No  Other Topics Concern  . Not on file  Social History Narrative  . Not on file    FAMILY HISTORY: Family History  Problem Relation Age of Onset  . Dementia Mother   . CAD Father   . Prostate cancer Father   . Hypertension Brother   . Hyperlipidemia Brother   . Prostate cancer Paternal Uncle   . CAD Paternal Aunt   . Kidney cancer Paternal Aunt   . Cancer Paternal Aunt   .  Dementia Maternal Grandmother   . Colon cancer Neg Hx   . Colon polyps Neg Hx     ALLERGIES:  is allergic to penicillins; ciprofloxacin; darvocet [propoxyphene n-acetaminophen]; and tylenol [acetaminophen].  MEDICATIONS:  Current Outpatient Medications  Medication Sig Dispense Refill  . atorvastatin (LIPITOR) 20 MG tablet Take 20 mg by mouth daily.    . cetirizine (ZYRTEC) 10 MG tablet Take 10 mg by mouth daily as needed for allergies.     Marland Kitchen ibuprofen (ADVIL,MOTRIN) 200 MG tablet Take 2 tablets (400 mg total) by mouth every 8 (eight) hours as needed for mild pain or moderate pain.    Marland Kitchen losartan (COZAAR) 25 MG tablet Take 25 mg by mouth daily. Only takes if bp is greater than 140/80    . saccharomyces boulardii (FLORASTOR) 250 MG capsule Take 1 capsule (250 mg total) by mouth 2 (two) times daily. 60 capsule 0  . tadalafil (CIALIS) 5 MG tablet Take 5 mg by mouth as needed for erectile dysfunction.    . Na Sulfate-K Sulfate-Mg Sulf 17.5-3.13-1.6 GM/177ML SOLN Take 1 kit by mouth as directed. (Patient not taking: Reported on 06/09/2018) 1 Bottle 0   No current facility-administered medications for this visit.     REVIEW OF SYSTEMS:   Constitutional: Denies fevers, chills or abnormal night sweats Eyes: Denies blurriness of vision, double vision or watery eyes Ears, nose, mouth, throat, and face: Denies mucositis or sore throat Respiratory: Denies cough, dyspnea or wheezes Cardiovascular: Denies palpitation, chest discomfort or lower extremity swelling Gastrointestinal: Loose stools  Skin: Denies abnormal skin rashes Lymphatics: Denies new lymphadenopathy or easy bruising Neurological:Denies numbness, tingling or new weaknesses Behavioral/Psych: Mood is stable, no new changes  All other systems were reviewed with the patient and are negative.  PHYSICAL EXAMINATION: ECOG PERFORMANCE STATUS: 1 - Symptomatic but completely ambulatory     GENERAL:alert, no distress and comfortable SKIN:  skin color, texture, turgor are normal, no rashes or significant lesions EYES: normal, conjunctiva are pink and non-injected, sclera clear OROPHARYNX:no exudate, no erythema and lips, buccal mucosa, and tongue normal  NECK: supple, thyroid normal size, non-tender, without nodularity LYMPH:  no palpable lymphadenopathy in the cervical, axillary or inguinal LUNGS: clear to auscultation and percussion with normal breathing effort HEART: regular rate & rhythm and no murmurs and no lower extremity edema ABDOMEN:abdomen soft, non-tender and normal bowel sounds Musculoskeletal:no cyanosis of digits and no clubbing  PSYCH: alert & oriented x 3 with fluent speech NEURO: no focal motor/sensory deficits  LABORATORY DATA:  I have reviewed the data as listed Recent Results (from the past 2160 hour(s))  CBC with Differential  Status: None   Collection Time: 05/18/18  9:03 PM  Result Value Ref Range   WBC 9.7 4.0 - 10.5 K/uL   RBC 4.77 4.22 - 5.81 MIL/uL   Hemoglobin 14.3 13.0 - 17.0 g/dL   HCT 42.9 39.0 - 52.0 %   MCV 89.9 80.0 - 100.0 fL   MCH 30.0 26.0 - 34.0 pg   MCHC 33.3 30.0 - 36.0 g/dL   RDW 12.7 11.5 - 15.5 %   Platelets 198 150 - 400 K/uL   nRBC 0.0 0.0 - 0.2 %   Neutrophils Relative % 53 %   Neutro Abs 5.0 1.7 - 7.7 K/uL   Lymphocytes Relative 39 %   Lymphs Abs 3.8 0.7 - 4.0 K/uL   Monocytes Relative 6 %   Monocytes Absolute 0.6 0.1 - 1.0 K/uL   Eosinophils Relative 2 %   Eosinophils Absolute 0.2 0.0 - 0.5 K/uL   Basophils Relative 0 %   Basophils Absolute 0.0 0.0 - 0.1 K/uL   Immature Granulocytes 0 %   Abs Immature Granulocytes 0.04 0.00 - 0.07 K/uL    Comment: Performed at [facility], [address], [place], [place] [address]  Comprehensive metabolic panel     Status: Abnormal   Collection Time: 05/18/18  9:03 PM  Result Value Ref Range   Sodium 140 135 - 145 mmol/L   Potassium 3.0 (L) 3.5 - 5.1 mmol/L   Chloride 108 98 - 111 mmol/L   CO2 22 22 - 32 mmol/L    Glucose, Bld 148 (H) 70 - 99 mg/dL   BUN 12 6 - 20 mg/dL   Creatinine, Ser 1.12 0.61 - 1.24 mg/dL   Calcium 8.1 (L) 8.9 - 10.3 mg/dL   Total Protein 5.8 (L) 6.5 - 8.1 g/dL   Albumin 3.5 3.5 - 5.0 g/dL   AST 31 15 - 41 U/L   ALT 37 0 - 44 U/L   Alkaline Phosphatase 53 38 - 126 U/L   Total Bilirubin 1.0 0.3 - 1.2 mg/dL   GFR calc non Af Amer >60 >60 mL/min   GFR calc Af Amer >60 >60 mL/min   Anion gap 10 5 - 15    Comment: Performed at [facility], [address], [place], [place] [address]  Lipase, blood     Status: None   Collection Time: 05/18/18  9:03 PM  Result Value Ref Range   Lipase 31 11 - 51 U/L    Comment: Performed at [facility], [address], [place], [place] [address]  Lactic acid, plasma     Status: Abnormal   Collection Time: 05/18/18  9:03 PM  Result Value Ref Range   Lactic Acid, Venous 3.1 (HH) 0.5 - 1.9 mmol/L    Comment: CRITICAL RESULT CALLED TO, READ BACK BY AND VERIFIED WITH: GIBSON,K AT 2127 ON 2.19.20 BY ISLEY,B Performed at [facility], [address] [place], [place] [address]   Troponin I - ONCE - STAT     Status: None   Collection Time: 05/18/18  9:03 PM  Result Value Ref Range   Troponin I <0.03 <0.03 ng/mL    Comment: Performed at [facility], [address] [place], [place] [address]  Magnesium     Status: None   Collection Time: 05/18/18  9:03 PM  Result Value Ref Range   Magnesium 2.0 1.7 - 2.4 mg/dL    Comment: Performed at [facility], [address] [place],  [address]  Phosphorus     Status:  None   Collection Time: 05/18/18  9:03 PM  Result Value Ref Range   Phosphorus 4.1 2.5 - 4.6 mg/dL    Comment: Performed at [facility], [address], [place], [place] [address]  Blood culture (routine x 2)     Status: None   Collection Time: 05/18/18 10:12 PM  Result Value Ref Range   Specimen Description BLOOD LEFT HAND    Special Requests      BOTTLES DRAWN AEROBIC AND ANAEROBIC Blood Culture adequate volume   Culture      NO  GROWTH 5 DAYS Performed at [facility], [address], [place], [place] [address]    Report Status 05/23/2018 FINAL   Blood culture (routine x 2)     Status: None   Collection Time: 05/18/18 10:12 PM  Result Value Ref Range   Specimen Description LEFT ANTECUBITAL    Special Requests      BOTTLES DRAWN AEROBIC ONLY Blood Culture adequate volume   Culture      NO GROWTH 5 DAYS Performed at [facility], [address], [place], [place] [address]    Report Status 05/23/2018 FINAL   Lactic acid, plasma     Status: Abnormal   Collection Time: 05/18/18 11:23 PM  Result Value Ref Range   Lactic Acid, Venous 3.8 (HH) 0.5 - 1.9 mmol/L    Comment: CRITICAL RESULT CALLED TO, READ BACK BY AND VERIFIED WITH: B MYRICK,RN '@[id number]'  05/18/18 MKELLY Performed at [facility], [address] [place], [place] [address]   Urinalysis, Routine w reflex microscopic     Status: Abnormal   Collection Time: 05/19/18  1:41 AM  Result Value Ref Range   Color, Urine STRAW (A) YELLOW   APPearance CLEAR CLEAR   Specific Gravity, Urine 1.016 1.005 - 1.030   pH 6.0 5.0 - 8.0   Glucose, UA NEGATIVE NEGATIVE mg/dL   Hgb urine dipstick NEGATIVE NEGATIVE   Bilirubin Urine NEGATIVE NEGATIVE   Ketones, ur NEGATIVE NEGATIVE mg/dL   Protein, ur NEGATIVE NEGATIVE mg/dL   Nitrite NEGATIVE NEGATIVE   Leukocytes,Ua NEGATIVE NEGATIVE    Comment: Performed at [facility], [address] [place], [place] [address]  Comprehensive metabolic panel     Status: Abnormal   Collection Time: 05/19/18  3:43 AM  Result Value Ref Range   Sodium 139 135 - 145 mmol/L   Potassium 4.7 3.5 - 5.1 mmol/L    Comment: DELTA CHECK NOTED   Chloride 112 (H) 98 - 111 mmol/L   CO2 19 (L) 22 - 32 mmol/L   Glucose, Bld 129 (H) 70 - 99 mg/dL   BUN 9 6 - 20 mg/dL   Creatinine, Ser 0.98 0.61 - 1.24 mg/dL   Calcium 7.6 (L) 8.9 - 10.3 mg/dL   Total Protein 5.8 (L) 6.5 - 8.1 g/dL   Albumin 3.4 (L) 3.5 - 5.0 g/dL   AST 30 15 - 41 U/L   ALT 35 0 -  44 U/L   Alkaline Phosphatase 53 38 - 126 U/L   Total Bilirubin 1.0 0.3 - 1.2 mg/dL   GFR calc non Af Amer >60 >60 mL/min   GFR calc Af Amer >60 >60 mL/min   Anion gap 8 5 - 15    Comment: Performed at [facility], [address] [place], [place] [address]  Troponin I - Now Then Q6H     Status: Abnormal   Collection Time: 05/19/18  3:43 AM  Result Value Ref Range   Troponin I  0.07 (HH) <0.03 ng/mL    Comment: CRITICAL RESULT CALLED TO, READ BACK BY AND VERIFIED WITH: MYRICK,B AT 4:50AM ON 05/19/18 BY [facility] Performed at [facility], [address], [place], [place] [address]   Lactic acid, plasma     Status: Abnormal   Collection Time: 05/19/18  3:43 AM  Result Value Ref Range   Lactic Acid, Venous 2.3 (HH) 0.5 - 1.9 mmol/L    Comment: CRITICAL RESULT CALLED TO, READ BACK BY AND VERIFIED WITH: DOSS,M AT 4:20AM ON 05/19/18 BY [facility] Performed at [facility], [address], [place], [place] [address]   CBG monitoring, ED     Status: Abnormal   Collection Time: 05/19/18  6:14 AM  Result Value Ref Range   Glucose-Capillary 117 (H) 70 - 99 mg/dL   Comment 1 Notify RN    Comment 2 Document in Chart   Troponin I - Now Then Q6H     Status: Abnormal   Collection Time: 05/19/18  9:41 AM  Result Value Ref Range   Troponin I 0.06 (HH) <0.03 ng/mL    Comment: CRITICAL VALUE NOTED.  VALUE IS CONSISTENT WITH PREVIOUSLY REPORTED AND CALLED VALUE. Performed at [facility], [address], [place], [place] [address]   CBC WITH DIFFERENTIAL     Status: Abnormal   Collection Time: 05/19/18  9:41 AM  Result Value Ref Range   WBC 10.8 (H) 4.0 - 10.5 K/uL   RBC 4.47 4.22 - 5.81 MIL/uL   Hemoglobin 13.7 13.0 - 17.0 g/dL   HCT 41.0 39.0 - 52.0 %   MCV 91.7 80.0 - 100.0 fL   MCH 30.6 26.0 - 34.0 pg   MCHC 33.4 30.0 - 36.0 g/dL   RDW 13.1 11.5 - 15.5 %   Platelets 172 150 - 400 K/uL   nRBC 0.0 0.0 - 0.2 %   Neutrophils Relative % 74 %   Neutro Abs 8.0 (H) 1.7 - 7.7 K/uL    Lymphocytes Relative 18 %   Lymphs Abs 1.9 0.7 - 4.0 K/uL   Monocytes Relative 6 %   Monocytes Absolute 0.7 0.1 - 1.0 K/uL   Eosinophils Relative 1 %   Eosinophils Absolute 0.1 0.0 - 0.5 K/uL   Basophils Relative 0 %   Basophils Absolute 0.0 0.0 - 0.1 K/uL   Immature Granulocytes 1 %   Abs Immature Granulocytes 0.05 0.00 - 0.07 K/uL    Comment: Performed at [facility], [address] [place], [place] [address]  ECHOCARDIOGRAM COMPLETE     Status: None   Collection Time: 05/19/18 11:34 AM  Result Value Ref Range   Weight 2,800 oz   Height 69 in   BP 127/75 mmHg  Gastrointestinal Panel by PCR , Stool     Status: None   Collection Time: 05/19/18  7:00 PM  Result Value Ref Range   Campylobacter species NOT DETECTED NOT DETECTED   Plesimonas shigelloides NOT DETECTED NOT DETECTED   Salmonella species NOT DETECTED NOT DETECTED   Yersinia enterocolitica NOT DETECTED NOT DETECTED   Vibrio species NOT DETECTED NOT DETECTED   Vibrio cholerae NOT DETECTED NOT DETECTED   Enteroaggregative E coli (EAEC) NOT DETECTED NOT DETECTED   Enteropathogenic E coli (EPEC) NOT DETECTED NOT DETECTED   Enterotoxigenic E coli (ETEC) NOT DETECTED NOT DETECTED   Shiga like toxin producing E coli (STEC) NOT DETECTED NOT DETECTED   Shigella/Enteroinvasive E coli (EIEC) NOT DETECTED NOT DETECTED   Cryptosporidium NOT DETECTED NOT DETECTED   Cyclospora  cayetanensis NOT DETECTED NOT DETECTED   Entamoeba histolytica NOT DETECTED NOT DETECTED   Giardia lamblia NOT DETECTED NOT DETECTED   Adenovirus F40/41 NOT DETECTED NOT DETECTED   Astrovirus NOT DETECTED NOT DETECTED   Norovirus GI/GII NOT DETECTED NOT DETECTED   Rotavirus A NOT DETECTED NOT DETECTED   Sapovirus (I, II, IV, and V) NOT DETECTED NOT DETECTED    Comment: Performed at [facility], [address] [place], [place] [address]  Basic metabolic panel     Status: Abnormal   Collection Time: 05/20/18  6:15 AM  Result Value Ref Range    Sodium 139 135 - 145 mmol/L   Potassium 3.8 3.5 - 5.1 mmol/L    Comment: DELTA CHECK NOTED   Chloride 111 98 - 111 mmol/L   CO2 23 22 - 32 mmol/L   Glucose, Bld 93 70 - 99 mg/dL   BUN 6 6 - 20 mg/dL   Creatinine, Ser 0.83 0.61 - 1.24 mg/dL   Calcium 8.3 (L) 8.9 - 10.3 mg/dL   GFR calc non Af Amer >60 >60 mL/min   GFR calc Af Amer >60 >60 mL/min   Anion gap 5 5 - 15    Comment: Performed at [facility], [address], [place], [place] [address]  CBC     Status: Abnormal   Collection Time: 05/20/18  6:15 AM  Result Value Ref Range   WBC 8.2 4.0 - 10.5 K/uL   RBC 4.04 (L) 4.22 - 5.81 MIL/uL   Hemoglobin 12.6 (L) 13.0 - 17.0 g/dL   HCT 37.0 (L) 39.0 - 52.0 %   MCV 91.6 80.0 - 100.0 fL   MCH 31.2 26.0 - 34.0 pg   MCHC 34.1 30.0 - 36.0 g/dL   RDW 13.1 11.5 - 15.5 %   Platelets 143 (L) 150 - 400 K/uL   nRBC 0.0 0.0 - 0.2 %    Comment: Performed at [facility], [address] [place], [place] [address]  Lactic acid, plasma     Status: None   Collection Time: 05/20/18  6:15 AM  Result Value Ref Range   Lactic Acid, Venous 0.9 0.5 - 1.9 mmol/L    Comment: Performed at [facility], [address] [place], [place] [address]    RADIOGRAPHIC STUDIES: I have personally reviewed the radiological images as listed and agreed with the findings in the report. Ct Abdomen Pelvis W Contrast  Result Date: 05/18/2018 CLINICAL DATA:  63 year old male with syncopal episode while driving. Abdominal pain and nausea. EXAM: CT ABDOMEN AND PELVIS WITH CONTRAST TECHNIQUE: Multidetector CT imaging of the abdomen and pelvis was performed using the standard protocol following bolus administration of intravenous contrast. CONTRAST:  121m OMNIPAQUE IOHEXOL 300 MG/ML  SOLN COMPARISON:  Lumbar MRI 07/08/2011. Noncontrast CT Abdomen and Pelvis 10/24/2004. FINDINGS: Lower chest: Negative. No pericardial or pleural effusion. Hepatobiliary: Negative liver and gallbladder. No bile duct enlargement. Pancreas: Negative.  Spleen: Negative. Adrenals/Urinary Tract: Normal adrenal glands. Punctate left nephrolithiasis. Tiny benign renal cysts suspected. Symmetric renal enhancement and contrast excretion. Normal proximal ureters. Negative distal ureters. Mildly distended but otherwise unremarkable urinary bladder. Stomach/Bowel: Featureless appearing sigmoid colon with fluid type retained stool. Mild sigmoid wall thickening. No rectal wall thickening. No mesenteric inflammation. There is mild proximal sigmoid diverticulosis. The descending colon and distal transverse colon also appears mildly thickened but are completely decompressed. The proximal transverse, hepatic flexure and right colon have a more normal appearance. Normal appendix. Negative terminal ileum. No dilated small bowel.  Small fat containing umbilical hernia (series 2, image 57). The stomach is moderately distended with fluid. Negative duodenum. No free air, free fluid. Vascular/Lymphatic: Aortoiliac calcified atherosclerosis. Major arterial structures are patent. Portal venous system is patent. No lymphadenopathy. Reproductive: Small volume fluid suspected in both inguinal canals, might be related to scrotal hydroceles. Much of the scrotum is not included. Other: No pelvic free fluid. Musculoskeletal: No acute osseous abnormality identified. IMPRESSION: 1. Appearance suspicious for Mild Colitis from the distal transverse colon through the sigmoid colon. Featureless appearance with wall thickening but no mesenteric inflammation. No associated obstruction, fluid collection or other complicating features. 2. Punctate left nephrolithiasis. 3. Possible scrotal hydroceles. Electronically Signed   By: Genevie Ann M.D.   On: 05/18/2018 21:58    ASSESSMENT & PLAN:  Night sweats 1.  Night sweats: - Patient was admitted to the hospital from 05/18/2018 through 05/20/2018 for syncopal episode.  2 days prior to that, he started having diarrhea and abdominal cramping and was dehydrated.   Diarrhea stopped 1 day after hospitalization.  Stool work-up was negative.  C. difficile was not done. - CT of the abdomen and pelvis on 05/18/2018 showed mild colitis from distal transverse colon through sigmoid colon with wall thickening but no mesenteric inflammation.  Spleen and liver was normal.  Punctate left nephrolithiasis. -She was discharged home on Augmentin for 10 days. -Last colonoscopy on 02/12/2014 shows diverticulosis.  He is scheduled for a colonoscopy on 07/27/2018 by Dr. Gala Romney. - Denies any fevers or weight loss.  He started having night sweats from 05/21/2018 and had about 3 episodes of drenching night sweats.  Otherwise he had mild night sweats every night when his T-shirt feels wet/damp when he wakes up mostly around the neck and upper chest. -He also had occasional diarrhea with left upper quadrant cramps for the last 10 days.  Also reported history of nausea, vomiting and diarrhea from June through October 2019. -He has 2 pets but denies any tick bites.  No herbal/over-the-counter medications. -Family history significant for father and paternal uncle with prostate cancer in 2 paternal aunts with cancer, 1 of whom had kidney cancer. - We will do infectious work-up including HIV.  Past cultures during hospitalization from blood were negative.  Denies any travel or tick bites.  Will check CRP, LDH, serum chromogranin levels.  We will also check testosterone level.  24-hour urine will be checked for 5 HIAA, metanephrines and catecholamines as work-up for neuroendocrine syndromes. -If the above work-up is nonconclusive, will consider bone marrow aspiration and biopsy.  2.  Prostate cancer: - He underwent robotic prostatectomy on 01/25/2013, Gleason (3+4 is equal to 7). - pT2c/P T3a.  He follows up with Dr. Tresa Moore at [facility] urology. -We will send a PSA level today.     All questions were answered. The patient knows to call the clinic with any problems, questions or concerns.      Derek Jack, MD 06/09/18 4:41 PM

## 2018-06-10 ENCOUNTER — Inpatient Hospital Stay (HOSPITAL_COMMUNITY): Payer: 59

## 2018-06-10 ENCOUNTER — Other Ambulatory Visit: Payer: Self-pay

## 2018-06-10 DIAGNOSIS — R61 Generalized hyperhidrosis: Secondary | ICD-10-CM | POA: Diagnosis not present

## 2018-06-10 LAB — C-REACTIVE PROTEIN: CRP: <0.8 mg/dL (ref <1.0)

## 2018-06-10 LAB — TSH: TSH: 1.083 u[IU]/mL (ref 0.350–4.500)

## 2018-06-10 LAB — LACTATE DEHYDROGENASE: LDH: 131 U/L (ref 98–192)

## 2018-06-10 LAB — PSA: Prostatic Specific Antigen: 0.01 ng/mL (ref 0.00–4.00)

## 2018-06-11 LAB — TESTOSTERONE: TESTOSTERONE: 989 ng/dL — AB (ref 264–916)

## 2018-06-13 ENCOUNTER — Other Ambulatory Visit (HOSPITAL_COMMUNITY)
Admission: RE | Admit: 2018-06-13 | Discharge: 2018-06-13 | Disposition: A | Discharge status: Home / Self Care | Payer: 59 | Source: Ambulatory Visit | Attending: Hematology | Admitting: Hematology

## 2018-06-13 DIAGNOSIS — R61 Generalized hyperhidrosis: Secondary | ICD-10-CM | POA: Insufficient documentation

## 2018-06-15 LAB — METANEPHRINES, URINE, 24 HOUR
METANEPH TOTAL UR: 29 ug/L
Metanephrines, 24H Ur: 54 ug/24 hr (ref 45–290)
Normetanephrine, 24H Ur: 371 ug/24 hr (ref 82–500)
Normetanephrine, Ur: 198 ug/L
Total Volume: 1875

## 2018-06-16 LAB — 5 HIAA, QUANTITATIVE, URINE, 24 HOUR
5 HIAA UR: 1.6 mg/L
5-HIAA,Quant.,24 Hr Urine: 3 mg/24 hr (ref 0.0–14.9)
Total Volume: 1875

## 2018-06-22 ENCOUNTER — Other Ambulatory Visit: Payer: Self-pay

## 2018-06-22 ENCOUNTER — Ambulatory Visit (HOSPITAL_COMMUNITY)
Admission: RE | Admit: 2018-06-22 | Discharge: 2018-06-22 | Disposition: A | Discharge status: Home / Self Care | Payer: 59 | Source: Ambulatory Visit | Attending: Hematology | Admitting: Hematology

## 2018-06-22 DIAGNOSIS — R61 Generalized hyperhidrosis: Secondary | ICD-10-CM

## 2018-06-22 MED ORDER — IOHEXOL 300 MG/ML  SOLN
75.0000 mL | Freq: Once | INTRAMUSCULAR | Status: AC | PRN
  Administered 2018-06-22: 75 mL via INTRAVENOUS

## 2018-06-27 ENCOUNTER — Encounter (HOSPITAL_COMMUNITY): Payer: Self-pay | Admitting: Hematology

## 2018-06-27 ENCOUNTER — Inpatient Hospital Stay (HOSPITAL_COMMUNITY): Payer: 59 | Attending: Hematology | Admitting: Hematology

## 2018-06-27 ENCOUNTER — Other Ambulatory Visit: Payer: Self-pay

## 2018-06-27 VITALS — BP 148/91 | HR 72 | Temp 97.9°F | Resp 16 | Wt 174.5 lb

## 2018-06-27 DIAGNOSIS — C61 Malignant neoplasm of prostate: Secondary | ICD-10-CM | POA: Diagnosis not present

## 2018-06-27 DIAGNOSIS — R61 Generalized hyperhidrosis: Secondary | ICD-10-CM

## 2018-06-27 DIAGNOSIS — Z9079 Acquired absence of other genital organ(s): Secondary | ICD-10-CM | POA: Diagnosis not present

## 2018-06-27 NOTE — Patient Instructions (Addendum)
.  [place] at [facility] Discharge Instructions  You were seen today by Dr. Delton Coombes. He went over your recent lab results, everything was normal. He discussed a Bone marrow biopsy with you and how this would be the final test to do to evaluate the night sweats. We will get you scheduled for the biopsy. He will see you back in  2 weeks after bone marrow biopsy for labs and follow up.   Thank you for choosing [place] at [facility] to provide your oncology and hematology care.  To afford each patient quality time with our provider, please arrive at least 15 minutes before your scheduled appointment time.   If you have a lab appointment with the [facility] please come in thru the  Main Entrance and check in at the main information desk  You need to re-schedule your appointment should you arrive 10 or more minutes late.  We strive to give you quality time with our providers, and arriving late affects you and other patients whose appointments are after yours.  Also, if you no show three or more times for appointments you may be dismissed from the clinic at the providers discretion.     Again, thank you for choosing [facility].  Our hope is that these requests will decrease the amount of time that you wait before being seen by our physicians.       _____________________________________________________________  Should you have questions after your visit to [facility], please contact our office at [phone] between the hours of 8:00 a.m. and 4:30 p.m.  Voicemails left after 4:00 p.m. will not be returned until the following business day.  For prescription refill requests, have your pharmacy contact our office and allow 72 hours.    Cancer Center Support Programs:   > Cancer Support Group  2nd Tuesday of the month 1pm-2pm, Journey Room

## 2018-06-27 NOTE — Assessment & Plan Note (Addendum)
1.  Night sweats: - Patient was admitted to the hospital from 05/18/2018 through 05/20/2018 for syncopal episode.  2 days prior to that, he started having diarrhea and abdominal cramping and was dehydrated.  Diarrhea stopped 1 day after hospitalization.  Stool work-up was negative.  C. difficile was not done. - CT of the abdomen and pelvis on 05/18/2018 showed mild colitis from distal transverse colon through sigmoid colon with wall thickening but no mesenteric inflammation.  Spleen and liver was normal.  Punctate left nephrolithiasis. -She was discharged home on Augmentin for 10 days. -Last colonoscopy on 02/12/2014 shows diverticulosis.  He is scheduled for a colonoscopy on 07/27/2018 by Dr. Gala Romney. - Denies any fevers or weight loss.  He started having night sweats from 05/21/2018 and had about 3 episodes of drenching night sweats.  Otherwise he had mild night sweats every night when his T-shirt feels wet/damp when he wakes up mostly around the neck and upper chest. -He also had occasional diarrhea with left upper quadrant cramps for the last 10 days.  Also reported history of nausea, vomiting and diarrhea from June through October 2019. -He has 2 pets but denies any tick bites.  No herbal/over-the-counter medications. -Family history significant for father and paternal uncle with prostate cancer in 2 paternal aunts with cancer, 1 of whom had kidney cancer. - LDH, CRP, testosterone, serum chromogranin levels were normal.  24-hour urine for 5-HIAA and metanephrines were normal. -He reports that his abdominal cramping and diarrhea is improved. -CT chest on 06/22/2018 did not show any acute changes. -He is continuing to have night sweats. -I have recommended bone marrow aspiration and biopsy to complete the work-up.  We will also send HIV testing at that time. -I will see him back after the bone marrow biopsy.  2.  Prostate cancer: - He underwent robotic prostatectomy on 01/25/2013, Gleason (3+4 is equal  to 7). - pT2c/P T3a.  He follows up with Dr. Tresa Moore at [facility] urology. -His PSA was 0.01.

## 2018-06-27 NOTE — Progress Notes (Signed)
Troy [name], [place] [address]   CLINIC:  Medical Oncology/Hematology  PCP:  Lemmie Evens, MD [place] [address] [phone]   REASON FOR VISIT:  Follow-up for Night sweats, abdominal cramping    INTERVAL HISTORY:  Troy [name] 63 y.o. male returns for routine follow-up. He is here today alone. He states that he is still experiencing severe night sweats. The cramping he had has resolved. Denies any nausea, vomiting, or diarrhea. Denies any new pains. Had not noticed any recent bleeding such as epistaxis, hematuria or hematochezia. Denies recent chest pain on exertion, shortness of breath on minimal exertion, pre-syncopal episodes, or palpitations. Denies any numbness or tingling in hands or feet. Denies any recent fevers, infections, or recent hospitalizations. Patient reports appetite at 75% and energy level at 75%.  REVIEW OF SYSTEMS:  Review of Systems  Constitutional:       Positive for night sweats.  All other systems reviewed and are negative.    PAST MEDICAL/SURGICAL HISTORY:  Past Medical History:  Diagnosis Date  . Arthritis    back  . Cancer [facility])    prostate cancer  . Diverticula of colon    left side.  Marland Kitchen GERD (gastroesophageal reflux disease)    food related  . Herniated disc    3 herniated discs in neck  . History of kidney stones   . Seasonal allergies    Past Surgical History:  Procedure Laterality Date  . COLONOSCOPY  01/09/2008   Dr.Rourk- normal rectum, L sided diverticulum. colonic mucosa and terminal ileum mucosa appeared normal.  . COLONOSCOPY  1996   colonic adenomas  . COLONOSCOPY  2001   L sided diverticula  . COLONOSCOPY N/A 02/12/2014   Dr. Gala Romney: diverticulosis  . HERNIA REPAIR Left 2008  . LITHOTRIPSY     x2  . NECK SURGERY    . ROBOT ASSISTED LAPAROSCOPIC RADICAL PROSTATECTOMY N/A 01/25/2013   Procedure: ROBOTIC ASSISTED LAPAROSCOPIC RADICAL PROSTATECTOMY LEVEL 1;   Surgeon: Molli Hazard, MD;  Location: [facility];  Service: Urology;  Laterality: N/A;     SOCIAL HISTORY:  Social History   Socioeconomic History  . Marital status: Married    Spouse name: Leonce Bale  . Number of children: 2  . Years of education: Not on file  . Highest education level: Not on file  Occupational History  . 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 Financial resource strain: Somewhat hard  . Food insecurity:    Worry: Never true    Inability: Never true  . Transportation needs:    Medical: No    Non-medical: No  Tobacco Use  . Smoking status: Never Smoker  . Smokeless tobacco: Never Used  Substance and Sexual Activity  . Alcohol use: No  . Drug use: No  . Sexual activity: Not on file  Lifestyle  . Physical activity:    Days per week: 0 days    Minutes per session: 0 min  . Stress: Only a little  Relationships  . Social connections:    Talks on phone: More than three times a week    Gets together: More than three times a week    Attends religious service: 1 to 4 times per year    Active member of club or organization: No    Attends meetings of clubs or organizations: Never    Relationship status: Married  . Intimate partner  violence:    Fear of current or ex partner: No    Emotionally abused: No    Physically abused: No    Forced sexual activity: No  Other Topics Concern  . Not on file  Social History Narrative  . Not on file    FAMILY HISTORY:  Family History  Problem Relation Age of Onset  . Dementia Mother   . CAD Father   . Prostate cancer Father   . Hypertension Brother   . Hyperlipidemia Brother   . Prostate cancer Paternal Uncle   . CAD Paternal Aunt   . Kidney cancer Paternal Aunt   . Cancer Paternal Aunt   . Dementia Maternal Grandmother   . Colon cancer Neg Hx   . Colon polyps Neg Hx     CURRENT MEDICATIONS:  Outpatient Encounter Medications as of 06/27/2018  Medication Sig  .  atorvastatin (LIPITOR) 20 MG tablet Take 20 mg by mouth daily.  . cetirizine (ZYRTEC) 10 MG tablet Take 10 mg by mouth daily as needed for allergies.   Marland Kitchen ibuprofen (ADVIL,MOTRIN) 200 MG tablet Take 2 tablets (400 mg total) by mouth every 8 (eight) hours as needed for mild pain or moderate pain.  Marland Kitchen losartan (COZAAR) 25 MG tablet Take 25 mg by mouth daily. Only takes if bp is greater than 140/80  . saccharomyces boulardii (FLORASTOR) 250 MG capsule Take 1 capsule (250 mg total) by mouth 2 (two) times daily.  . tadalafil (CIALIS) 5 MG tablet Take 5 mg by mouth as needed for erectile dysfunction.  . Na Sulfate-K Sulfate-Mg Sulf 17.5-3.13-1.6 GM/177ML SOLN Take 1 kit by mouth as directed. (Patient not taking: Reported on 06/09/2018)   No facility-administered encounter medications on file as of 06/27/2018.     ALLERGIES:  Allergies  Allergen Reactions  . Penicillins Other (See Comments)    Did it involve swelling of the face/tongue/throat, SOB, or low BP? No Did it involve sudden or severe rash/hives, skin peeling, or any reaction on the inside of your mouth or nose? No Did you need to seek medical attention at a hospital or doctor's office? No When did it last happen?Over 10 years If all above answers are "NO", may proceed with cephalosporin use.    Numbness around mouth   . Ciprofloxacin Rash  . Darvocet [Propoxyphene N-Acetaminophen] Rash  . Tylenol [Acetaminophen] Rash     PHYSICAL EXAM:  ECOG Performance status: 1  Vitals:   06/27/18 1005  BP: (!) 148/91  Pulse: 72  Resp: 16  Temp: 97.9 F (36.6 C)  SpO2: 98%   Filed Weights   06/27/18 1005  Weight: 174 lb 8 oz (79.2 kg)    Physical Exam Constitutional:      Appearance: Normal appearance.  Cardiovascular:     Rate and Rhythm: Normal rate and regular rhythm.  Pulmonary:     Effort: Pulmonary effort is normal.     Breath sounds: Normal breath sounds.  Abdominal:     Palpations: Abdomen is soft. There is no  mass.  Musculoskeletal:        General: No swelling.  Skin:    General: Skin is warm.  Neurological:     General: No focal deficit present.     Mental Status: He is alert and oriented to person, place, and time.  Psychiatric:        Mood and Affect: Mood normal.        Behavior: Behavior normal.      LABORATORY DATA:  I have reviewed the labs as listed.  CBC    Component Value Date/Time   WBC 8.2 05/20/2018 0615   RBC 4.04 (L) 05/20/2018 0615   HGB 12.6 (L) 05/20/2018 0615   HCT 37.0 (L) 05/20/2018 0615   PLT 143 (L) 05/20/2018 0615   MCV 91.6 05/20/2018 0615   MCH 31.2 05/20/2018 0615   MCHC 34.1 05/20/2018 0615   RDW 13.1 05/20/2018 0615   LYMPHSABS 1.9 05/19/2018 0941   MONOABS 0.7 05/19/2018 0941   EOSABS 0.1 05/19/2018 0941   BASOSABS 0.0 05/19/2018 0941   CMP Latest Ref Rng & Units 05/20/2018 05/19/2018 05/18/2018  Glucose 70 - 99 mg/dL 93 129(H) 148(H)  BUN 6 - 20 mg/dL '6 9 12  ' Creatinine 0.61 - 1.24 mg/dL 0.83 0.98 1.12  Sodium 135 - 145 mmol/L 139 139 140  Potassium 3.5 - 5.1 mmol/L 3.8 4.7 3.0(L)  Chloride 98 - 111 mmol/L 111 112(H) 108  CO2 22 - 32 mmol/L 23 19(L) 22  Calcium 8.9 - 10.3 mg/dL 8.3(L) 7.6(L) 8.1(L)  Total Protein 6.5 - 8.1 g/dL - 5.8(L) 5.8(L)  Total Bilirubin 0.3 - 1.2 mg/dL - 1.0 1.0  Alkaline Phos 38 - 126 U/L - 53 53  AST 15 - 41 U/L - 30 31  ALT 0 - 44 U/L - 35 37       DIAGNOSTIC IMAGING:  I have independently reviewed the scans and discussed with the patient.   I have reviewed Venita Lick LPN's note and agree with the documentation.  I personally performed a face-to-face visit, made revisions and my assessment and plan is as follows.    ASSESSMENT & PLAN:   Night sweats 1.  Night sweats: - Patient was admitted to the hospital from 05/18/2018 through 05/20/2018 for syncopal episode.  2 days prior to that, he started having diarrhea and abdominal cramping and was dehydrated.  Diarrhea stopped 1 day after hospitalization.   Stool work-up was negative.  C. difficile was not done. - CT of the abdomen and pelvis on 05/18/2018 showed mild colitis from distal transverse colon through sigmoid colon with wall thickening but no mesenteric inflammation.  Spleen and liver was normal.  Punctate left nephrolithiasis. -She was discharged home on Augmentin for 10 days. -Last colonoscopy on 02/12/2014 shows diverticulosis.  He is scheduled for a colonoscopy on 07/27/2018 by Dr. Gala Romney. - Denies any fevers or weight loss.  He started having night sweats from 05/21/2018 and had about 3 episodes of drenching night sweats.  Otherwise he had mild night sweats every night when his T-shirt feels wet/damp when he wakes up mostly around the neck and upper chest. -He also had occasional diarrhea with left upper quadrant cramps for the last 10 days.  Also reported history of nausea, vomiting and diarrhea from June through October 2019. -He has 2 pets but denies any tick bites.  No herbal/over-the-counter medications. -Family history significant for father and paternal uncle with prostate cancer in 2 paternal aunts with cancer, 1 of whom had kidney cancer. - LDH, CRP, testosterone, serum chromogranin levels were normal.  24-hour urine for 5-HIAA and metanephrines were normal. -He reports that his abdominal cramping and diarrhea is improved. -CT chest on 06/22/2018 did not show any acute changes. -He is continuing to have night sweats. -I have recommended bone marrow aspiration and biopsy to complete the work-up.  We will also send HIV testing at that time. -I will see him back after the bone marrow biopsy.  2.  Prostate  cancer: - He underwent robotic prostatectomy on 01/25/2013, Gleason (3+4 is equal to 7). - pT2c/P T3a.  He follows up with Dr. Tresa Moore at [facility] urology. -His PSA was 0.01.  Total time spent is 25 minutes with more than 50% of the time spent face-to-face discussing scan results, lab results, coordination of care.    Orders  placed this encounter:  Orders Placed This Encounter  Procedures  . CBC with Differential  . HIV antibody (with reflex)      Derek Jack, MD [place] [phone]

## 2018-07-06 ENCOUNTER — Other Ambulatory Visit: Payer: Self-pay

## 2018-07-07 ENCOUNTER — Inpatient Hospital Stay (HOSPITAL_COMMUNITY): Payer: 59 | Attending: Hematology | Admitting: Hematology

## 2018-07-07 ENCOUNTER — Encounter (HOSPITAL_COMMUNITY): Payer: Self-pay | Admitting: Hematology

## 2018-07-07 ENCOUNTER — Inpatient Hospital Stay (HOSPITAL_COMMUNITY): Payer: 59

## 2018-07-07 DIAGNOSIS — C61 Malignant neoplasm of prostate: Secondary | ICD-10-CM | POA: Diagnosis not present

## 2018-07-07 DIAGNOSIS — Z79899 Other long term (current) drug therapy: Secondary | ICD-10-CM | POA: Insufficient documentation

## 2018-07-07 DIAGNOSIS — R61 Generalized hyperhidrosis: Secondary | ICD-10-CM

## 2018-07-07 NOTE — Progress Notes (Signed)
[place], [place] [address]   CLINIC:  Medical Oncology/Hematology  PCP:  Lemmie Evens, MD [place] [address] [phone]   REASON FOR VISIT:  Follow-up for night sweats.  CURRENT THERAPY: Observation.    INTERVAL HISTORY:  Mr. Martis 63 y.o. male returns for follow-up of night sweats.  Since the last visit he has experienced 3 episodes of night sweats.  He reports that they are not drenching.  Denies any fevers.  Appetite has been normal.  Energy levels are great and he is able to do all his activities.  Denies any ER visits or infections.  Denies any hospitalizations after recent visit.  He does not come in contact with cattle.    REVIEW OF SYSTEMS:  Review of Systems  Constitutional:       Positive for night sweats.  All other systems reviewed and are negative.    PAST MEDICAL/SURGICAL HISTORY:  Past Medical History:  Diagnosis Date  . Arthritis    back  . Cancer [facility])    prostate cancer  . Diverticula of colon    left side.  Marland Kitchen GERD (gastroesophageal reflux disease)    food related  . Herniated disc    3 herniated discs in neck  . History of kidney stones   . Seasonal allergies    Past Surgical History:  Procedure Laterality Date  . COLONOSCOPY  01/09/2008   Dr.Rourk- normal rectum, L sided diverticulum. colonic mucosa and terminal ileum mucosa appeared normal.  . COLONOSCOPY  1996   colonic adenomas  . COLONOSCOPY  2001   L sided diverticula  . COLONOSCOPY N/A 02/12/2014   Dr. Gala Romney: diverticulosis  . HERNIA REPAIR Left 2008  . LITHOTRIPSY     x2  . NECK SURGERY    . ROBOT ASSISTED LAPAROSCOPIC RADICAL PROSTATECTOMY N/A 01/25/2013   Procedure: ROBOTIC ASSISTED LAPAROSCOPIC RADICAL PROSTATECTOMY LEVEL 1;  Surgeon: Molli Hazard, MD;  Location: [facility];  Service: Urology;  Laterality: N/A;     SOCIAL HISTORY:  Social History   Socioeconomic History  . Marital status:  Married    Spouse name: Oluwatimilehin Balfour  . Number of children: 2  . Years of education: Not on file  . Highest education level: Not on file  Occupational History  . 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 Financial resource strain: Somewhat hard  . Food insecurity:    Worry: Never true    Inability: Never true  . Transportation needs:    Medical: No    Non-medical: No  Tobacco Use  . Smoking status: Never Smoker  . Smokeless tobacco: Never Used  Substance and Sexual Activity  . Alcohol use: No  . Drug use: No  . Sexual activity: Not on file  Lifestyle  . Physical activity:    Days per week: 0 days    Minutes per session: 0 min  . Stress: Only a little  Relationships  . Social connections:    Talks on phone: More than three times a week    Gets together: More than three times a week    Attends religious service: 1 to 4 times per year    Active member of club or organization: No    Attends meetings of clubs or organizations: Never    Relationship status: Married  . Intimate partner violence:    Fear of current or ex partner: No  Emotionally abused: No    Physically abused: No    Forced sexual activity: No  Other Topics Concern  . Not on file  Social History Narrative  . Not on file    FAMILY HISTORY:  Family History  Problem Relation Age of Onset  . Dementia Mother   . CAD Father   . Prostate cancer Father   . Hypertension Brother   . Hyperlipidemia Brother   . Prostate cancer Paternal Uncle   . CAD Paternal Aunt   . Kidney cancer Paternal Aunt   . Cancer Paternal Aunt   . Dementia Maternal Grandmother   . Colon cancer Neg Hx   . Colon polyps Neg Hx     CURRENT MEDICATIONS:  Outpatient Encounter Medications as of 07/07/2018  Medication Sig  . atorvastatin (LIPITOR) 20 MG tablet Take 20 mg by mouth daily.  . cetirizine (ZYRTEC) 10 MG tablet Take 10 mg by mouth daily as needed for allergies.   Marland Kitchen ibuprofen (ADVIL,MOTRIN)  200 MG tablet Take 2 tablets (400 mg total) by mouth every 8 (eight) hours as needed for mild pain or moderate pain.  Marland Kitchen losartan (COZAAR) 25 MG tablet Take 25 mg by mouth daily. Only takes if bp is greater than 140/80  . Na Sulfate-K Sulfate-Mg Sulf 17.5-3.13-1.6 GM/177ML SOLN Take 1 kit by mouth as directed. (Patient not taking: Reported on 06/09/2018)  . saccharomyces boulardii (FLORASTOR) 250 MG capsule Take 1 capsule (250 mg total) by mouth 2 (two) times daily.  . tadalafil (CIALIS) 5 MG tablet Take 5 mg by mouth as needed for erectile dysfunction.   No facility-administered encounter medications on file as of 07/07/2018.     ALLERGIES:  Allergies  Allergen Reactions  . Penicillins Other (See Comments)    Did it involve swelling of the face/tongue/throat, SOB, or low BP? No Did it involve sudden or severe rash/hives, skin peeling, or any reaction on the inside of your mouth or nose? No Did you need to seek medical attention at a hospital or doctor's office? No When did it last happen?Over 10 years If all above answers are "NO", may proceed with cephalosporin use.    Numbness around mouth   . Ciprofloxacin Rash  . Darvocet [Propoxyphene N-Acetaminophen] Rash  . Tylenol [Acetaminophen] Rash     PHYSICAL EXAM:  ECOG Performance status: 1  I have reviewed his vitals. Physical Exam Constitutional:      Appearance: Normal appearance.  Neck:     Musculoskeletal: Neck supple. No muscular tenderness.  Cardiovascular:     Rate and Rhythm: Normal rate and regular rhythm.  Abdominal:     General: Bowel sounds are normal. There is no distension.     Palpations: Abdomen is soft. There is no mass.  Musculoskeletal:        General: No swelling.  Lymphadenopathy:     Cervical: No cervical adenopathy.  Skin:    General: Skin is warm.  Neurological:     General: No focal deficit present.     Mental Status: He is alert and oriented to person, place, and time.  Psychiatric:         Mood and Affect: Mood normal.        Behavior: Behavior normal.      LABORATORY DATA:  I have reviewed the labs as listed.  CBC    Component Value Date/Time   WBC 8.2 05/20/2018 0615   RBC 4.04 (L) 05/20/2018 0615   HGB 12.6 (L) 05/20/2018 9450  HCT 37.0 (L) 05/20/2018 0615   PLT 143 (L) 05/20/2018 0615   MCV 91.6 05/20/2018 0615   MCH 31.2 05/20/2018 0615   MCHC 34.1 05/20/2018 0615   RDW 13.1 05/20/2018 0615   LYMPHSABS 1.9 05/19/2018 0941   MONOABS 0.7 05/19/2018 0941   EOSABS 0.1 05/19/2018 0941   BASOSABS 0.0 05/19/2018 0941   CMP Latest Ref Rng & Units 05/20/2018 05/19/2018 05/18/2018  Glucose 70 - 99 mg/dL 93 129(H) 148(H)  BUN 6 - 20 mg/dL '6 9 12  ' Creatinine 0.61 - 1.24 mg/dL 0.83 0.98 1.12  Sodium 135 - 145 mmol/L 139 139 140  Potassium 3.5 - 5.1 mmol/L 3.8 4.7 3.0(L)  Chloride 98 - 111 mmol/L 111 112(H) 108  CO2 22 - 32 mmol/L 23 19(L) 22  Calcium 8.9 - 10.3 mg/dL 8.3(L) 7.6(L) 8.1(L)  Total Protein 6.5 - 8.1 g/dL - 5.8(L) 5.8(L)  Total Bilirubin 0.3 - 1.2 mg/dL - 1.0 1.0  Alkaline Phos 38 - 126 U/L - 53 53  AST 15 - 41 U/L - 30 31  ALT 0 - 44 U/L - 35 37       DIAGNOSTIC IMAGING:  I have independently reviewed the scans and discussed with the patient.       ASSESSMENT & PLAN:   Night sweats 1.  Night sweats: - Patient was admitted to the hospital from 05/18/2018 through 05/20/2018 for syncopal episode.  2 days prior to that, he started having diarrhea and abdominal cramping and was dehydrated.  Diarrhea stopped 1 day after hospitalization.  Stool work-up was negative.  C. difficile was not done. - CT of the abdomen and pelvis on 05/18/2018 showed mild colitis from distal transverse colon through sigmoid colon with wall thickening but no mesenteric inflammation.  Spleen and liver was normal.  Punctate left nephrolithiasis. -She was discharged home on Augmentin for 10 days. -Last colonoscopy on 02/12/2014 shows diverticulosis.  He is scheduled for a  colonoscopy on 07/27/2018 by Dr. Gala Romney. - Denies any fevers or weight loss.  He started having night sweats from 05/21/2018 and had about 3 episodes of drenching night sweats.  Otherwise he had mild night sweats every night when his T-shirt feels wet/damp when he wakes up mostly around the neck and upper chest. -He also had occasional diarrhea with left upper quadrant cramps for the last 10 days.  Also reported history of nausea, vomiting and diarrhea from June through October 2019. -He has 2 pets but denies any tick bites.  No herbal/over-the-counter medications. -Family history significant for father and paternal uncle with prostate cancer in 2 paternal aunts with cancer, 1 of whom had kidney cancer. - Further blood work including LDH, CRP, testosterone, serum chromogranin levels were normal. -24-hour urine for hydroxyindoleacetic acid and metanephrines were normal. -CT of the chest on 06/22/2018 did not show any acute changes. -I have recommended bone marrow biopsy as final work-up.  Since the last visit 10 days ago, he had experienced about 3 episodes of night sweats but no episodes of drenching night sweats. - Hence I have recommended to hold off on bone marrow biopsy at this time.  He has a follow-up appointment to see me in 2 weeks.  We will do HIV testing, CBC and LDH at that time. -He was told to call us should his night sweats get any worse.  Will consider bone marrow biopsy at that time.  2.  Prostate cancer: - He underwent robotic prostatectomy on 01/25/2013, Gleason (3+4 is equal to 7). -  pT2c/P T3a.  He follows up with Dr. Tresa Moore at [facility] urology. -His PSA was 0.01.      Orders placed this encounter:  Orders Placed This Encounter  Procedures  . Lactate dehydrogenase      Derek Jack, MD [place] [phone]

## 2018-07-07 NOTE — Progress Notes (Signed)
Patient here today for bone marrow biopsy.  After talking with Dr. Delton Coombes, they decided to hold off on the procedure today.  Patient was discharged ambulatory and in stable condition from clinic.  He is to follow up in 2 weeks with labs as previously scheduled.

## 2018-07-07 NOTE — Addendum Note (Signed)
Addended by: Derek Jack on: 07/07/2018 08:28 AM   Modules accepted: Level of Service

## 2018-07-07 NOTE — Assessment & Plan Note (Signed)
1.  Night sweats: - Patient was admitted to the hospital from 05/18/2018 through 05/20/2018 for syncopal episode.  2 days prior to that, he started having diarrhea and abdominal cramping and was dehydrated.  Diarrhea stopped 1 day after hospitalization.  Stool work-up was negative.  C. difficile was not done. - CT of the abdomen and pelvis on 05/18/2018 showed mild colitis from distal transverse colon through sigmoid colon with wall thickening but no mesenteric inflammation.  Spleen and liver was normal.  Punctate left nephrolithiasis. -She was discharged home on Augmentin for 10 days. -Last colonoscopy on 02/12/2014 shows diverticulosis.  He is scheduled for a colonoscopy on 07/27/2018 by Dr. Gala Romney. - Denies any fevers or weight loss.  He started having night sweats from 05/21/2018 and had about 3 episodes of drenching night sweats.  Otherwise he had mild night sweats every night when his T-shirt feels wet/damp when he wakes up mostly around the neck and upper chest. -He also had occasional diarrhea with left upper quadrant cramps for the last 10 days.  Also reported history of nausea, vomiting and diarrhea from June through October 2019. -He has 2 pets but denies any tick bites.  No herbal/over-the-counter medications. -Family history significant for father and paternal uncle with prostate cancer in 2 paternal aunts with cancer, 1 of whom had kidney cancer. - Further blood work including LDH, CRP, testosterone, serum chromogranin levels were normal. -24-hour urine for hydroxyindoleacetic acid and metanephrines were normal. -CT of the chest on 06/22/2018 did not show any acute changes. -I have recommended bone marrow biopsy as final work-up.  Since the last visit 10 days ago, he had experienced about 3 episodes of night sweats but no episodes of drenching night sweats. - Hence I have recommended to hold off on bone marrow biopsy at this time.  He has a follow-up appointment to see me in 2 weeks.  We will do  HIV testing, CBC and LDH at that time. -He was told to call us should his night sweats get any worse.  Will consider bone marrow biopsy at that time.  2.  Prostate cancer: - He underwent robotic prostatectomy on 01/25/2013, Gleason (3+4 is equal to 7). - pT2c/P T3a.  He follows up with Dr. Tresa Moore at [facility] urology. -His PSA was 0.01.

## 2018-07-20 ENCOUNTER — Telehealth: Payer: Self-pay

## 2018-07-20 NOTE — Telephone Encounter (Signed)
Called pt, TCS 07/27/18 moved up to 11:45am. Pt to arrive at 10:45am. Advised him to start drinking 2nd half of prep at 6:45am the morning of procedure and NPO after 8:45am. LMOVM for endo scheduler.

## 2018-07-21 ENCOUNTER — Other Ambulatory Visit: Payer: Self-pay

## 2018-07-21 ENCOUNTER — Inpatient Hospital Stay (HOSPITAL_COMMUNITY): Payer: 59

## 2018-07-21 DIAGNOSIS — R61 Generalized hyperhidrosis: Secondary | ICD-10-CM

## 2018-07-21 LAB — CBC WITH DIFFERENTIAL/PLATELET
Abs Immature Granulocytes: 0.01 10*3/uL (ref 0.00–0.07)
Basophils Absolute: 0.1 10*3/uL (ref 0.0–0.1)
Basophils Relative: 1 %
Eosinophils Absolute: 0.2 10*3/uL (ref 0.0–0.5)
Eosinophils Relative: 3 %
HCT: 44.4 % (ref 39.0–52.0)
Hemoglobin: 15.2 g/dL (ref 13.0–17.0)
Immature Granulocytes: 0 %
Lymphocytes Relative: 38 %
Lymphs Abs: 2.2 10*3/uL (ref 0.7–4.0)
MCH: 30.9 pg (ref 26.0–34.0)
MCHC: 34.2 g/dL (ref 30.0–36.0)
MCV: 90.2 fL (ref 80.0–100.0)
Monocytes Absolute: 0.4 10*3/uL (ref 0.1–1.0)
Monocytes Relative: 7 %
Neutro Abs: 2.9 10*3/uL (ref 1.7–7.7)
Neutrophils Relative %: 51 %
Platelets: 175 10*3/uL (ref 150–400)
RBC: 4.92 MIL/uL (ref 4.22–5.81)
RDW: 12.4 % (ref 11.5–15.5)
WBC: 5.7 10*3/uL (ref 4.0–10.5)
nRBC: 0 % (ref 0.0–0.2)

## 2018-07-21 LAB — LACTATE DEHYDROGENASE: LDH: 129 U/L (ref 98–192)

## 2018-07-22 ENCOUNTER — Encounter (HOSPITAL_COMMUNITY): Payer: Self-pay | Admitting: Hematology

## 2018-07-22 ENCOUNTER — Inpatient Hospital Stay (HOSPITAL_COMMUNITY): Payer: 59 | Attending: Hematology | Admitting: Hematology

## 2018-07-22 ENCOUNTER — Other Ambulatory Visit (HOSPITAL_COMMUNITY): Payer: 59

## 2018-07-22 VITALS — BP 148/77 | HR 80 | Temp 98.2°F | Resp 18 | Wt 175.0 lb

## 2018-07-22 DIAGNOSIS — Z8042 Family history of malignant neoplasm of prostate: Secondary | ICD-10-CM | POA: Diagnosis not present

## 2018-07-22 DIAGNOSIS — Z8051 Family history of malignant neoplasm of kidney: Secondary | ICD-10-CM | POA: Insufficient documentation

## 2018-07-22 DIAGNOSIS — Z8249 Family history of ischemic heart disease and other diseases of the circulatory system: Secondary | ICD-10-CM | POA: Diagnosis not present

## 2018-07-22 DIAGNOSIS — Z809 Family history of malignant neoplasm, unspecified: Secondary | ICD-10-CM | POA: Diagnosis not present

## 2018-07-22 DIAGNOSIS — Z8719 Personal history of other diseases of the digestive system: Secondary | ICD-10-CM | POA: Diagnosis not present

## 2018-07-22 DIAGNOSIS — R61 Generalized hyperhidrosis: Secondary | ICD-10-CM | POA: Insufficient documentation

## 2018-07-22 DIAGNOSIS — Z87442 Personal history of urinary calculi: Secondary | ICD-10-CM | POA: Diagnosis not present

## 2018-07-22 DIAGNOSIS — Z8546 Personal history of malignant neoplasm of prostate: Secondary | ICD-10-CM | POA: Insufficient documentation

## 2018-07-22 DIAGNOSIS — Z820 Family history of epilepsy and other diseases of the nervous system: Secondary | ICD-10-CM | POA: Insufficient documentation

## 2018-07-22 DIAGNOSIS — Z9079 Acquired absence of other genital organ(s): Secondary | ICD-10-CM | POA: Diagnosis present

## 2018-07-22 LAB — HIV ANTIBODY (ROUTINE TESTING W REFLEX): HIV Screen 4th Generation wRfx: NONREACTIVE

## 2018-07-22 NOTE — Assessment & Plan Note (Signed)
1.  Night sweats: - Patient was admitted to the hospital from 05/18/2018 through 05/20/2018 for syncopal episode.  2 days prior to that, he started having diarrhea and abdominal cramping and was dehydrated.  Diarrhea stopped 1 day after hospitalization.  Stool work-up was negative.  C. difficile was not done. - CT of the abdomen and pelvis on 05/18/2018 showed mild colitis from distal transverse colon through sigmoid colon with wall thickening but no mesenteric inflammation.  Spleen and liver was normal.  Punctate left nephrolithiasis. -She was discharged home on Augmentin for 10 days. -Last colonoscopy on 02/12/2014 shows diverticulosis.  He is scheduled for a colonoscopy on 07/27/2018 by Dr. Gala Romney. - Denies any fevers or weight loss.  He started having night sweats from 05/21/2018 and had about 3 episodes of drenching night sweats.  Otherwise he had mild night sweats every night when his T-shirt feels wet/damp when he wakes up mostly around the neck and upper chest. -He also had occasional diarrhea with left upper quadrant cramps for the last 10 days.  Also reported history of nausea, vomiting and diarrhea from June through October 2019. -He has 2 pets but denies any tick bites.  No herbal/over-the-counter medications. -Family history significant for father and paternal uncle with prostate cancer in 2 paternal aunts with cancer, 1 of whom had kidney cancer. - Further blood work including LDH, CRP, testosterone, serum chromogranin levels were normal. -24-hour urine for hydroxyindoleacetic acid and metanephrines were normal. -CT of the chest on 06/22/2018 did not show any acute changes. -I have recommended bone marrow biopsy as final work-up.  Since the last visit 10 days ago, he had experienced about 3 episodes of night sweats but no episodes of drenching night sweats. - I have held his bone marrow biopsy on 07/07/2018 as his night sweats have improved from before. -In the last 2 weeks he had 2 more light  episodes of night sweats. - I have discussed the results of his blood work which showed normal CBC, LDH.  HIV test was negative. -I have recommended follow-up visit in 3 months.  If he does not have any night sweats at that time, I will discharge him from the clinic.  He was told to come back sooner should he develop any worsening of night sweats.  2.  Prostate cancer: - He underwent robotic prostatectomy on 01/25/2013, Gleason (3+4 is equal to 7). - pT2c/P T3a.  He follows up with Dr. Tresa Moore at [facility] urology. -His PSA was 0.01.

## 2018-07-22 NOTE — Patient Instructions (Signed)
[facility] Discharge Instructions  You were seen today by Dr. Katragadda. He went over your recent lab results. He will see you back in 3 months for labs and follow up.   Thank you for choosing  Cancer Center at [facility] to provide your oncology and hematology care.  To afford each patient quality time with our provider, please arrive at least 15 minutes before your scheduled appointment time.   If you have a lab appointment with the Cancer Center please come in thru the  Main Entrance and check in at the main information desk  You need to re-schedule your appointment should you arrive 10 or more minutes late.  We strive to give you quality time with our providers, and arriving late affects you and other patients whose appointments are after yours.  Also, if you no show three or more times for appointments you may be dismissed from the clinic at the providers discretion.     Again, thank you for choosing [facility].  Our hope is that these requests will decrease the amount of time that you wait before being seen by our physicians.       _____________________________________________________________  Should you have questions after your visit to [facility], please contact our office at [phone] between the hours of 8:00 a.m. and 4:30 p.m.  Voicemails left after 4:00 p.m. will not be returned until the following business day.  For prescription refill requests, have your pharmacy contact our office and allow 72 hours.    Cancer Center Support Programs:   > Cancer Support Group  2nd Tuesday of the month 1pm-2pm, Journey Room    

## 2018-07-22 NOTE — Progress Notes (Signed)
[name], [place] [address]   CLINIC:  Medical Oncology/Hematology  PCP:  Lemmie Evens, MD [place] [address] [phone]   REASON FOR VISIT:  Follow-up for night sweats.     INTERVAL HISTORY:  Troy [name] 63 y.o. male returns for follow-up of his night sweats.  In the last 2 weeks, he had 2 episodes of light night sweats when he felt his T-shirt has dampness.  Denies any fevers or weight loss.  Denies any infections or hospitalizations.  Denies any ER visits.  He denied any nausea, vomiting or diarrhea.  No bleeding per rectum reported.   REVIEW OF SYSTEMS:  Review of Systems  All other systems reviewed and are negative.    PAST MEDICAL/SURGICAL HISTORY:  Past Medical History:  Diagnosis Date  . Arthritis    back  . Cancer [facility])    prostate cancer  . Diverticula of colon    left side.  Marland Kitchen GERD (gastroesophageal reflux disease)    food related  . Herniated disc    3 herniated discs in neck  . History of kidney stones   . Seasonal allergies    Past Surgical History:  Procedure Laterality Date  . COLONOSCOPY  01/09/2008   Dr.Rourk- normal rectum, L sided diverticulum. colonic mucosa and terminal ileum mucosa appeared normal.  . COLONOSCOPY  1996   colonic adenomas  . COLONOSCOPY  2001   L sided diverticula  . COLONOSCOPY N/A 02/12/2014   Dr. Gala Romney: diverticulosis  . HERNIA REPAIR Left 2008  . LITHOTRIPSY     x2  . NECK SURGERY    . ROBOT ASSISTED LAPAROSCOPIC RADICAL PROSTATECTOMY N/A 01/25/2013   Procedure: ROBOTIC ASSISTED LAPAROSCOPIC RADICAL PROSTATECTOMY LEVEL 1;  Surgeon: Molli Hazard, MD;  Location: [facility] ORS;  Service: Urology;  Laterality: N/A;     SOCIAL HISTORY:  Social History   Socioeconomic History  . Marital status: Married    Spouse name: Ko Bardon  . Number of children: 2  . Years of education: Not on file  . Highest education level: Not on file  Occupational  History  . 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 Financial resource strain: Somewhat hard  . Food insecurity:    Worry: Never true    Inability: Never true  . Transportation needs:    Medical: No    Non-medical: No  Tobacco Use  . Smoking status: Never Smoker  . Smokeless tobacco: Never Used  Substance and Sexual Activity  . Alcohol use: No  . Drug use: No  . Sexual activity: Not on file  Lifestyle  . Physical activity:    Days per week: 0 days    Minutes per session: 0 min  . Stress: Only a little  Relationships  . Social connections:    Talks on phone: More than three times a week    Gets together: More than three times a week    Attends religious service: 1 to 4 times per year    Active member of club or organization: No    Attends meetings of clubs or organizations: Never    Relationship status: Married  . Intimate partner violence:    Fear of current or ex partner: No    Emotionally abused: No    Physically abused: No    Forced sexual activity: No  Other Topics Concern  . Not on file  Social History  Narrative  . Not on file    FAMILY HISTORY:  Family History  Problem Relation Age of Onset  . Dementia Mother   . CAD Father   . Prostate cancer Father   . Hypertension Brother   . Hyperlipidemia Brother   . Prostate cancer Paternal Uncle   . CAD Paternal Aunt   . Kidney cancer Paternal Aunt   . Cancer Paternal Aunt   . Dementia Maternal Grandmother   . Colon cancer Neg Hx   . Colon polyps Neg Hx     CURRENT MEDICATIONS:  Outpatient Encounter Medications as of 07/22/2018  Medication Sig  . atorvastatin (LIPITOR) 20 MG tablet Take 20 mg by mouth daily.  . cetirizine (ZYRTEC) 10 MG tablet Take 10 mg by mouth daily.   Marland Kitchen ibuprofen (ADVIL,MOTRIN) 200 MG tablet Take 2 tablets (400 mg total) by mouth every 8 (eight) hours as needed for mild pain or moderate pain.  Marland Kitchen losartan (COZAAR) 25 MG tablet Take 25 mg by mouth daily.    . Multiple Vitamin (MULTIVITAMIN WITH MINERALS) TABS tablet Take 1 tablet by mouth daily.  . Na Sulfate-K Sulfate-Mg Sulf 17.5-3.13-1.6 GM/177ML SOLN Take 1 kit by mouth as directed.  Vladimir Faster Glycol-Propyl Glycol (SYSTANE OP) Place 1 drop into both eyes 2 (two) times daily as needed (dry eyes).  . saccharomyces boulardii (FLORASTOR) 250 MG capsule Take 1 capsule (250 mg total) by mouth 2 (two) times daily.  . tadalafil (CIALIS) 5 MG tablet Take 5 mg by mouth as needed for erectile dysfunction.   No facility-administered encounter medications on file as of 07/22/2018.     ALLERGIES:  Allergies  Allergen Reactions  . Penicillins Other (See Comments)    Did it involve swelling of the face/tongue/throat, SOB, or low BP? No Did it involve sudden or severe rash/hives, skin peeling, or any reaction on the inside of your mouth or nose? No Did you need to seek medical attention at a hospital or doctor's office? No When did it last happen?Over 10 years If all above answers are "NO", may proceed with cephalosporin use.    Numbness around mouth   . Ciprofloxacin Rash  . Darvocet [Propoxyphene N-Acetaminophen] Rash  . Tylenol [Acetaminophen] Rash     PHYSICAL EXAM:  ECOG Performance status: 1  Vitals:   07/22/18 1142  BP: (!) 148/77  Pulse: 80  Resp: 18  Temp: 98.2 F (36.8 C)  SpO2: 98%   Filed Weights   07/22/18 1142  Weight: 175 lb (79.4 kg)    Physical Exam Vitals signs reviewed.  Constitutional:      Appearance: Normal appearance.  Cardiovascular:     Rate and Rhythm: Normal rate and regular rhythm.     Heart sounds: Normal heart sounds.  Pulmonary:     Effort: Pulmonary effort is normal.     Breath sounds: Normal breath sounds.  Abdominal:     General: There is no distension.     Palpations: Abdomen is soft. There is no mass.  Musculoskeletal:        General: No swelling.  Skin:    General: Skin is warm.  Neurological:     General: No focal deficit  present.     Mental Status: He is alert and oriented to person, place, and time.  Psychiatric:        Mood and Affect: Mood normal.        Behavior: Behavior normal.      LABORATORY DATA:  I have reviewed  the labs as listed.  CBC    Component Value Date/Time   WBC 5.7 07/21/2018 0828   RBC 4.92 07/21/2018 0828   HGB 15.2 07/21/2018 0828   HCT 44.4 07/21/2018 0828   PLT 175 07/21/2018 0828   MCV 90.2 07/21/2018 0828   MCH 30.9 07/21/2018 0828   MCHC 34.2 07/21/2018 0828   RDW 12.4 07/21/2018 0828   LYMPHSABS 2.2 07/21/2018 0828   MONOABS 0.4 07/21/2018 0828   EOSABS 0.2 07/21/2018 0828   BASOSABS 0.1 07/21/2018 0828   CMP Latest Ref Rng & Units 05/20/2018 05/19/2018 05/18/2018  Glucose 70 - 99 mg/dL 93 129(H) 148(H)  BUN 6 - 20 mg/dL '6 9 12  ' Creatinine 0.61 - 1.24 mg/dL 0.83 0.98 1.12  Sodium 135 - 145 mmol/L 139 139 140  Potassium 3.5 - 5.1 mmol/L 3.8 4.7 3.0(L)  Chloride 98 - 111 mmol/L 111 112(H) 108  CO2 22 - 32 mmol/L 23 19(L) 22  Calcium 8.9 - 10.3 mg/dL 8.3(L) 7.6(L) 8.1(L)  Total Protein 6.5 - 8.1 g/dL - 5.8(L) 5.8(L)  Total Bilirubin 0.3 - 1.2 mg/dL - 1.0 1.0  Alkaline Phos 38 - 126 U/L - 53 53  AST 15 - 41 U/L - 30 31  ALT 0 - 44 U/L - 35 37       DIAGNOSTIC IMAGING:  I have independently reviewed the scans and discussed with the patient.   I have reviewed Venita Lick LPN's note and agree with the documentation.  I personally performed a face-to-face visit, made revisions and my assessment and plan is as follows.    ASSESSMENT & PLAN:   Night sweats 1.  Night sweats: - Patient was admitted to the hospital from 05/18/2018 through 05/20/2018 for syncopal episode.  2 days prior to that, he started having diarrhea and abdominal cramping and was dehydrated.  Diarrhea stopped 1 day after hospitalization.  Stool work-up was negative.  C. difficile was not done. - CT of the abdomen and pelvis on 05/18/2018 showed mild colitis from distal transverse colon  through sigmoid colon with wall thickening but no mesenteric inflammation.  Spleen and liver was normal.  Punctate left nephrolithiasis. -She was discharged home on Augmentin for 10 days. -Last colonoscopy on 02/12/2014 shows diverticulosis.  He is scheduled for a colonoscopy on 07/27/2018 by Dr. Gala Romney. - Denies any fevers or weight loss.  He started having night sweats from 05/21/2018 and had about 3 episodes of drenching night sweats.  Otherwise he had mild night sweats every night when his T-shirt feels wet/damp when he wakes up mostly around the neck and upper chest. -He also had occasional diarrhea with left upper quadrant cramps for the last 10 days.  Also reported history of nausea, vomiting and diarrhea from June through October 2019. -He has 2 pets but denies any tick bites.  No herbal/over-the-counter medications. -Family history significant for father and paternal uncle with prostate cancer in 2 paternal aunts with cancer, 1 of whom had kidney cancer. - Further blood work including LDH, CRP, testosterone, serum chromogranin levels were normal. -24-hour urine for hydroxyindoleacetic acid and metanephrines were normal. -CT of the chest on 06/22/2018 did not show any acute changes. -I have recommended bone marrow biopsy as final work-up.  Since the last visit 10 days ago, he had experienced about 3 episodes of night sweats but no episodes of drenching night sweats. - I have held his bone marrow biopsy on 07/07/2018 as his night sweats have improved from before. -In the last  2 weeks he had 2 more light episodes of night sweats. - I have discussed the results of his blood work which showed normal CBC, LDH.  HIV test was negative. -I have recommended follow-up visit in 3 months.  If he does not have any night sweats at that time, I will discharge him from the clinic.  He was told to come back sooner should he develop any worsening of night sweats.  2.  Prostate cancer: - He underwent robotic  prostatectomy on 01/25/2013, Gleason (3+4 is equal to 7). - pT2c/P T3a.  He follows up with Dr. Tresa Moore at [facility] urology. -His PSA was 0.01.   Total time spent is 25 minutes with more than 50% of the time spent face-to-face discussing his diagnosis, giving reassurance and coordination of care.  Orders placed this encounter:  Orders Placed This Encounter  Procedures  . CBC with Differential/Platelet  . Comprehensive metabolic panel  . Lactate dehydrogenase      Derek Jack, MD [place] [phone]

## 2018-07-26 NOTE — Telephone Encounter (Signed)
Endo advised pt's TCS for tomorrow has been moved to 1:00pm. Tried to call pt, no answer, LMOVM for return call.

## 2018-07-26 NOTE — Telephone Encounter (Signed)
Pt called office, endo had already called and told him of procedure time change and prep time change.

## 2018-07-27 ENCOUNTER — Other Ambulatory Visit: Payer: Self-pay

## 2018-07-27 ENCOUNTER — Encounter (HOSPITAL_COMMUNITY): Payer: Self-pay | Admitting: *Deleted

## 2018-07-27 ENCOUNTER — Ambulatory Visit (HOSPITAL_COMMUNITY)
Admission: RE | Admit: 2018-07-27 | Discharge: 2018-07-27 | Disposition: A | Discharge status: Home / Self Care | Payer: 59 | Attending: Internal Medicine | Admitting: Internal Medicine

## 2018-07-27 ENCOUNTER — Encounter (HOSPITAL_COMMUNITY)
Admission: RE | Disposition: A | Discharge status: Home / Self Care | Payer: Self-pay | Source: Home / Self Care | Attending: Internal Medicine

## 2018-07-27 ENCOUNTER — Encounter (HOSPITAL_COMMUNITY): Payer: Self-pay | Admitting: Anesthesiology

## 2018-07-27 DIAGNOSIS — K219 Gastro-esophageal reflux disease without esophagitis: Secondary | ICD-10-CM | POA: Diagnosis not present

## 2018-07-27 DIAGNOSIS — Z79899 Other long term (current) drug therapy: Secondary | ICD-10-CM | POA: Diagnosis not present

## 2018-07-27 DIAGNOSIS — K5730 Diverticulosis of large intestine without perforation or abscess without bleeding: Secondary | ICD-10-CM | POA: Insufficient documentation

## 2018-07-27 DIAGNOSIS — Z86010 Personal history of colonic polyps: Secondary | ICD-10-CM | POA: Diagnosis not present

## 2018-07-27 DIAGNOSIS — K529 Noninfective gastroenteritis and colitis, unspecified: Secondary | ICD-10-CM | POA: Insufficient documentation

## 2018-07-27 DIAGNOSIS — Z8719 Personal history of other diseases of the digestive system: Secondary | ICD-10-CM

## 2018-07-27 DIAGNOSIS — Z8546 Personal history of malignant neoplasm of prostate: Secondary | ICD-10-CM | POA: Insufficient documentation

## 2018-07-27 HISTORY — PX: COLONOSCOPY: SHX5424

## 2018-07-27 HISTORY — PX: BIOPSY: SHX5522

## 2018-07-27 SURGERY — COLONOSCOPY
Anesthesia: Moderate Sedation

## 2018-07-27 MED ORDER — STERILE WATER FOR IRRIGATION IR SOLN
Status: DC | PRN
  Administered 2018-07-27: 14:00:00 1.5 mL

## 2018-07-27 MED ORDER — MEPERIDINE HCL 50 MG/ML IJ SOLN
INTRAMUSCULAR | Status: AC
  Filled 2018-07-27: qty 1

## 2018-07-27 MED ORDER — ONDANSETRON HCL 4 MG/2ML IJ SOLN
INTRAMUSCULAR | Status: DC | PRN
  Administered 2018-07-27: 4 mg via INTRAVENOUS

## 2018-07-27 MED ORDER — ONDANSETRON HCL 4 MG/2ML IJ SOLN
INTRAMUSCULAR | Status: AC
  Filled 2018-07-27: qty 2

## 2018-07-27 MED ORDER — MIDAZOLAM HCL 5 MG/5ML IJ SOLN
INTRAMUSCULAR | Status: DC | PRN
  Administered 2018-07-27 (×2): 2 mg via INTRAVENOUS
  Administered 2018-07-27 (×2): 1 mg via INTRAVENOUS
  Administered 2018-07-27: 2 mg via INTRAVENOUS

## 2018-07-27 MED ORDER — MIDAZOLAM HCL 5 MG/5ML IJ SOLN
INTRAMUSCULAR | Status: AC
  Filled 2018-07-27: qty 10

## 2018-07-27 MED ORDER — SODIUM CHLORIDE 0.9 % IV SOLN
INTRAVENOUS | Status: DC
  Administered 2018-07-27: 13:00:00 via INTRAVENOUS

## 2018-07-27 MED ORDER — MEPERIDINE HCL 100 MG/ML IJ SOLN
INTRAMUSCULAR | Status: DC | PRN
  Administered 2018-07-27: 10 mg via INTRAVENOUS
  Administered 2018-07-27: 25 mg via INTRAVENOUS
  Administered 2018-07-27: 15 mg via INTRAVENOUS

## 2018-07-27 NOTE — Discharge Instructions (Signed)
Colonoscopy Discharge Instructions  Read the instructions outlined below and refer to this sheet in the next few weeks. These discharge instructions provide you with general information on caring for yourself after you leave the hospital. Your doctor may also give you specific instructions. While your treatment has been planned according to the most current medical practices available, unavoidable complications occasionally occur. If you have any problems or questions after discharge, call Dr. Gala Romney at [phone]. ACTIVITY  You may resume your regular activity, but move at a slower pace for the next 24 hours.   Take frequent rest periods for the next 24 hours.   Walking will help get rid of the air and reduce the bloated feeling in your belly (abdomen).   No driving for 24 hours (because of the medicine (anesthesia) used during the test).    Do not sign any important legal documents or operate any machinery for 24 hours (because of the anesthesia used during the test).  NUTRITION  Drink plenty of fluids.   You may resume your normal diet as instructed by your doctor.   Begin with a light meal and progress to your normal diet. Heavy or fried foods are harder to digest and may make you feel sick to your stomach (nauseated).   Avoid alcoholic beverages for 24 hours or as instructed.  MEDICATIONS  You may resume your normal medications unless your doctor tells you otherwise.  WHAT YOU CAN EXPECT TODAY  Some feelings of bloating in the abdomen.   Passage of more gas than usual.   Spotting of blood in your stool or on the toilet paper.  IF YOU HAD POLYPS REMOVED DURING THE COLONOSCOPY:  No aspirin products for 7 days or as instructed.   No alcohol for 7 days or as instructed.   Eat a soft diet for the next 24 hours.  FINDING OUT THE RESULTS OF YOUR TEST Not all test results are available during your visit. If your test results are not back during the visit, make an appointment  with your caregiver to find out the results. Do not assume everything is normal if you have not heard from your caregiver or the medical facility. It is important for you to follow up on all of your test results.  SEEK IMMEDIATE MEDICAL ATTENTION IF:  You have more than a spotting of blood in your stool.   Your belly is swollen (abdominal distention).   You are nauseated or vomiting.   You have a temperature over 101.   You have abdominal pain or discomfort that is severe or gets worse throughout the day.   Diverticulosis information provided  Further recommendations to follow pending review of pathology report    Diverticulosis  Diverticulosis is a condition that develops when small pouches (diverticula) form in the wall of the large intestine (colon). The colon is where water is absorbed and stool is formed. The pouches form when the inside layer of the colon pushes through weak spots in the outer layers of the colon. You may have a few pouches or many of them. What are the causes? The cause of this condition is not known. What increases the risk? The following factors may make you more likely to develop this condition:  Being older than age 61. Your risk for this condition increases with age. Diverticulosis is rare among people younger than age 61. By age 61, many people have it.  Eating a low-fiber diet.  Having frequent constipation.  Being overweight.  Not  getting enough exercise.  Smoking.  Taking over-the-counter pain medicines, like aspirin and ibuprofen.  Having a family history of diverticulosis. What are the signs or symptoms? In most people, there are no symptoms of this condition. If you do have symptoms, they may include:  Bloating.  Cramps in the abdomen.  Constipation or diarrhea.  Pain in the lower left side of the abdomen. How is this diagnosed? This condition is most often diagnosed during an exam for other colon problems. Because  diverticulosis usually has no symptoms, it often cannot be diagnosed independently. This condition may be diagnosed by:  Using a flexible scope to examine the colon (colonoscopy).  Taking an X-ray of the colon after dye has been put into the colon (barium enema).  Doing a CT scan. How is this treated? You may not need treatment for this condition if you have never developed an infection related to diverticulosis. If you have had an infection before, treatment may include:  Eating a high-fiber diet. This may include eating more fruits, vegetables, and grains.  Taking a fiber supplement.  Taking a live bacteria supplement (probiotic).  Taking medicine to relax your colon.  Taking antibiotic medicines. Follow these instructions at home:  Drink 6-8 glasses of water or more each day to prevent constipation.  Try not to strain when you have a bowel movement.  If you have had an infection before: ? Eat more fiber as directed by your health care provider or your diet and nutrition specialist (dietitian). ? Take a fiber supplement or probiotic, if your health care provider approves.  Take over-the-counter and prescription medicines only as told by your health care provider.  If you were prescribed an antibiotic, take it as told by your health care provider. Do not stop taking the antibiotic even if you start to feel better.  Keep all follow-up visits as told by your health care provider. This is important. Contact a health care provider if:  You have pain in your abdomen.  You have bloating.  You have cramps.  You have not had a bowel movement in 3 days. Get help right away if:  Your pain gets worse.  Your bloating becomes very bad.  You have a fever or chills, and your symptoms suddenly get worse.  You vomit.  You have bowel movements that are bloody or black.  You have bleeding from your rectum. Summary  Diverticulosis is a condition that develops when small pouches  (diverticula) form in the wall of the large intestine (colon).  You may have a few pouches or many of them.  This condition is most often diagnosed during an exam for other colon problems.  If you have had an infection related to diverticulosis, treatment may include increasing the fiber in your diet, taking supplements, or taking medicines. This information is not intended to replace advice given to you by your health care provider. Make sure you discuss any questions you have with your health care provider. Document Released: 12/12/2003 Document Revised: 02/03/2016 Document Reviewed: 02/03/2016 Elsevier Interactive Patient Education  2019 [employer].

## 2018-07-27 NOTE — H&P (Signed)
'@LOGO' @   Primary Care Physician:  Lemmie Evens, MD Primary Gastroenterologist:  Dr. Gala Romney  Pre-Procedure History & Physical: HPI:  Troy [name] is a 63 y.o. male here for history of colitis and diarrhea.  Distant history of colonic adenoma.  Symptoms have been reviewed.  He does take Advil regularly.  Past Medical History:  Diagnosis Date  . Arthritis    back  . Cancer [facility])    prostate cancer  . Diverticula of colon    left side.  Marland Kitchen GERD (gastroesophageal reflux disease)    food related  . Herniated disc    3 herniated discs in neck  . History of kidney stones   . Seasonal allergies     Past Surgical History:  Procedure Laterality Date  . COLONOSCOPY  01/09/2008   Dr.Wesam Gearhart- normal rectum, L sided diverticulum. colonic mucosa and terminal ileum mucosa appeared normal.  . COLONOSCOPY  1996   colonic adenomas  . COLONOSCOPY  2001   L sided diverticula  . COLONOSCOPY N/A 02/12/2014   Dr. Gala Romney: diverticulosis  . HERNIA REPAIR Left 2008  . LITHOTRIPSY     x2  . NECK SURGERY    . ROBOT ASSISTED LAPAROSCOPIC RADICAL PROSTATECTOMY N/A 01/25/2013   Procedure: ROBOTIC ASSISTED LAPAROSCOPIC RADICAL PROSTATECTOMY LEVEL 1;  Surgeon: Molli Hazard, MD;  Location: [facility];  Service: Urology;  Laterality: N/A;    Prior to Admission medications   Medication Sig Start Date End Date Taking? Authorizing Provider  atorvastatin (LIPITOR) 20 MG tablet Take 20 mg by mouth daily. 03/17/18  Yes [provider]  cetirizine (ZYRTEC) 10 MG tablet Take 10 mg by mouth daily.    Yes [provider]  ibuprofen (ADVIL,MOTRIN) 200 MG tablet Take 2 tablets (400 mg total) by mouth every 8 (eight) hours as needed for mild pain or moderate pain. 05/20/18  Yes Barton Dubois, MD  losartan (COZAAR) 25 MG tablet Take 25 mg by mouth daily.    Yes [provider]  Multiple Vitamin (MULTIVITAMIN WITH MINERALS) TABS tablet Take 1 tablet by mouth daily.   Yes [provider]  Polyethyl Glycol-Propyl Glycol (SYSTANE OP) Place 1 drop into both eyes 2 (two) times daily as needed (dry eyes).   Yes [provider]  saccharomyces boulardii (FLORASTOR) 250 MG capsule Take 1 capsule (250 mg total) by mouth 2 (two) times daily. 05/20/18  Yes Barton Dubois, MD  tadalafil (CIALIS) 5 MG tablet Take 5 mg by mouth as needed for erectile dysfunction.   Yes [provider]  Na Sulfate-K Sulfate-Mg Sulf 17.5-3.13-1.6 GM/177ML SOLN Take 1 kit by mouth as directed. 05/26/18   Daneil Dolin, MD    Allergies as of 05/26/2018 - Review Complete 05/26/2018  Allergen Reaction Noted  . Penicillins Other (See Comments) 07/10/2011  . Ciprofloxacin Rash 01/26/2013  . Darvocet [propoxyphene n-acetaminophen] Rash 07/10/2011  . Tylenol [acetaminophen] Rash 07/10/2011    Family History  Problem Relation Age of Onset  . Dementia Mother   . CAD Father   . Prostate cancer Father   . Hypertension Brother   . Hyperlipidemia Brother   . Prostate cancer Paternal Uncle   . CAD Paternal Aunt   . Kidney cancer Paternal Aunt   . Cancer Paternal Aunt   . Dementia Maternal Grandmother   . Colon cancer Neg Hx   . Colon polyps Neg Hx     Social History   Socioeconomic History  . Marital status: Married    Spouse  name: Dan Scearce  . Number of children: 2  . Years of education: Not on file  . Highest education level: Not on file  Occupational History  . 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 Financial resource strain: Somewhat hard  . Food insecurity:    Worry: Never true    Inability: Never true  . Transportation needs:    Medical: No    Non-medical: No  Tobacco Use  . Smoking status: Never Smoker  . Smokeless tobacco: Never Used  Substance and Sexual Activity  . Alcohol use: No  . Drug use: No  . Sexual activity: Not on file  Lifestyle  . Physical activity:    Days per week: 0 days    Minutes per  session: 0 min  . Stress: Only a little  Relationships  . Social connections:    Talks on phone: More than three times a week    Gets together: More than three times a week    Attends religious service: 1 to 4 times per year    Active member of club or organization: No    Attends meetings of clubs or organizations: Never    Relationship status: Married  . Intimate partner violence:    Fear of current or ex partner: No    Emotionally abused: No    Physically abused: No    Forced sexual activity: No  Other Topics Concern  . Not on file  Social History Narrative  . Not on file    Review of Systems: See HPI, otherwise negative ROS  Physical Exam: BP 131/84   Pulse 81   Temp 97.8 F (36.6 C) (Oral)   Resp 17   Ht '5\' 9"'  (1.753 m)   SpO2 100%   BMI 25.84 kg/m  General:   Alert,  Well-developed, well-nourished, pleasant and cooperative in NAD Neck:  Supple; no masses or thyromegaly. No significant cervical adenopathy. Lungs:  Clear throughout to auscultation.   No wheezes, crackles, or rhonchi. No acute distress. Heart:  Regular rate and rhythm; no murmurs, clicks, rubs,  or gallops. Abdomen: Non-distended, normal bowel sounds.  Soft and nontender without appreciable mass or hepatosplenomegaly.  Pulses:  Normal pulses noted. Extremities:  Without clubbing or edema.  Impression/Plan: Pleasant 63 year old gentleman with a history of colitis and adenomatous polyp.  Symptoms have improved recently.  He is here for colonoscopy per plan.  He does take regular NSAIDs.  The risks, benefits, limitations, alternatives and imponderables have been reviewed with the patient. Questions have been answered. All parties are agreeable.      Notice: This dictation was prepared with Dragon dictation along with smaller phrase technology. Any transcriptional errors that result from this process are unintentional and may not be corrected upon review.

## 2018-07-27 NOTE — Op Note (Addendum)
[facility] Patient Name: Troy [name] Procedure Date: 07/27/2018 1:20 PM MRN: [id number] Date of Birth: 1956-02-28 Attending MD: Norvel Richards , MD CSN: [id number] Age: 63 Admit Type: Outpatient Procedure:                Colonoscopy Indications:              Chronic diarrhea Providers:                Norvel Richards, MD, Charlsie Quest. Theda Sers RN, RN,                            Raphael Gibney, Technician, Aram Candela Referring MD:              Medicines:                Midazolam 8 mg IV, Meperidine 50 mg IV, Ondansetron                            4 mg IV Complications:            No immediate complications. Estimated Blood Loss:     Estimated blood loss was minimal. Procedure:                Pre-Anesthesia Assessment:                           - Prior to the procedure, a History and Physical                            was performed, and patient medications and                            allergies were reviewed. The patient's tolerance of                            previous anesthesia was also reviewed. The risks                            and benefits of the procedure and the sedation                            options and risks were discussed with the patient.                            All questions were answered, and informed consent                            was obtained. Prior Anticoagulants: The patient has                            taken no previous anticoagulant or antiplatelet                            agents. ASA Grade Assessment: II - A patient with  mild systemic disease. After reviewing the risks                            and benefits, the patient was deemed in                            satisfactory condition to undergo the procedure.                           After obtaining informed consent, the colonoscope                            was passed under direct vision. Throughout the                            procedure, the  patient's blood pressure, pulse, and                            oxygen saturations were monitored continuously. The                            [id number]-[id number] ([id number]) scope was introduced through                            the anus and advanced to the 5 cm into the ileum.                            The entire colon was well visualized. The entire                            colon was well visualized. Scope In: 2:00:13 PM Scope Out: 2:15:25 PM Scope Withdrawal Time: 0 hours 8 minutes 12 seconds  Total Procedure Duration: 0 hours 15 minutes 12 seconds  Findings:      The perianal and digital rectal examinations were normal.      Scattered medium-mouthed diverticula were found in the sigmoid colon and       descending colon.      The exam was otherwise without abnormality on direct and retroflexion       views. Distal 5 cm of terminal ileum appeared normal. Biopsies the right       and left colon taken for histologic study. Impression:               - Moderate diverticulosis in the sigmoid colon and                            in the descending colon.                           - The examination was otherwise normal on direct                            and retroflexion views.                           -  Status post segmental biopsy. Moderate Sedation:      Moderate (conscious) sedation was administered by the endoscopy nurse       and supervised by the endoscopist. The following parameters were       monitored: oxygen saturation, heart rate, blood pressure, respiratory       rate, EKG, adequacy of pulmonary ventilation, and response to care.       Total physician intraservice time was 24 minutes. Recommendation:           - Patient has a contact number available for                            emergencies. The signs and symptoms of potential                            delayed complications were discussed with the                            patient. Return to normal activities tomorrow.                             Written discharge instructions were provided to the                            patient.                           - Advance diet as tolerated.                           - Continue present medications.                           - Repeat colonoscopy in 10 years for screening                            purposes.                           - Return to GI office in 3 months. Follow-up on                            pathology. At patient's request, I have discussed                            my findings and recommendations with Serafina Royals                            at [phone]. Procedure Code(s):        --- Professional ---                           [phone], Colonoscopy, flexible; diagnostic, including                            collection of specimen(s) by brushing or washing,  when performed (separate procedure)                           [id number], Moderate sedation; each additional 15                            minutes intraservice time                           G0500, Moderate sedation services provided by the                            same physician or other qualified health care                            professional performing a gastrointestinal                            endoscopic service that sedation supports,                            requiring the presence of an independent trained                            observer to assist in the monitoring of the                            patient's level of consciousness and physiological                            status; initial 15 minutes of intra-service time;                            patient age 84 years or older (additional time may                            be reported with [phone], as appropriate) Diagnosis Code(s):        --- Professional ---                           K52.9, Noninfective gastroenteritis and colitis,                            unspecified                           K57.30,  Diverticulosis of large intestine without                            perforation or abscess without bleeding CPT copyright 2019 American Medical Association. All rights reserved. The codes documented in this report are preliminary and upon coder review may  be revised to meet current compliance requirements. Cristopher Estimable. Alayiah Fontes, MD Norvel Richards, MD 07/27/2018 2:25:02 PM This report has been signed electronically. Number of Addenda: 0

## 2018-07-31 ENCOUNTER — Encounter: Payer: Self-pay | Admitting: Internal Medicine

## 2018-08-01 ENCOUNTER — Encounter (HOSPITAL_COMMUNITY): Payer: Self-pay | Admitting: Internal Medicine

## 2018-10-14 ENCOUNTER — Inpatient Hospital Stay (HOSPITAL_COMMUNITY): Payer: 59 | Attending: Hematology

## 2018-10-14 ENCOUNTER — Other Ambulatory Visit: Payer: Self-pay

## 2018-10-14 DIAGNOSIS — Z8051 Family history of malignant neoplasm of kidney: Secondary | ICD-10-CM | POA: Diagnosis not present

## 2018-10-14 DIAGNOSIS — Z886 Allergy status to analgesic agent status: Secondary | ICD-10-CM | POA: Diagnosis not present

## 2018-10-14 DIAGNOSIS — Z87442 Personal history of urinary calculi: Secondary | ICD-10-CM | POA: Insufficient documentation

## 2018-10-14 DIAGNOSIS — K529 Noninfective gastroenteritis and colitis, unspecified: Secondary | ICD-10-CM | POA: Insufficient documentation

## 2018-10-14 DIAGNOSIS — R61 Generalized hyperhidrosis: Secondary | ICD-10-CM | POA: Diagnosis not present

## 2018-10-14 DIAGNOSIS — Z79899 Other long term (current) drug therapy: Secondary | ICD-10-CM | POA: Diagnosis not present

## 2018-10-14 DIAGNOSIS — Z809 Family history of malignant neoplasm, unspecified: Secondary | ICD-10-CM | POA: Diagnosis not present

## 2018-10-14 DIAGNOSIS — Z83438 Family history of other disorder of lipoprotein metabolism and other lipidemia: Secondary | ICD-10-CM | POA: Insufficient documentation

## 2018-10-14 DIAGNOSIS — Z880 Allergy status to penicillin: Secondary | ICD-10-CM | POA: Insufficient documentation

## 2018-10-14 DIAGNOSIS — Z881 Allergy status to other antibiotic agents status: Secondary | ICD-10-CM | POA: Insufficient documentation

## 2018-10-14 DIAGNOSIS — Z8042 Family history of malignant neoplasm of prostate: Secondary | ICD-10-CM | POA: Insufficient documentation

## 2018-10-14 DIAGNOSIS — N200 Calculus of kidney: Secondary | ICD-10-CM | POA: Diagnosis not present

## 2018-10-14 DIAGNOSIS — E860 Dehydration: Secondary | ICD-10-CM | POA: Insufficient documentation

## 2018-10-14 DIAGNOSIS — Z820 Family history of epilepsy and other diseases of the nervous system: Secondary | ICD-10-CM | POA: Insufficient documentation

## 2018-10-14 DIAGNOSIS — Z8249 Family history of ischemic heart disease and other diseases of the circulatory system: Secondary | ICD-10-CM | POA: Diagnosis not present

## 2018-10-14 LAB — COMPREHENSIVE METABOLIC PANEL
ALT: 30 U/L (ref 0–44)
AST: 27 U/L (ref 15–41)
Albumin: 4.5 g/dL (ref 3.5–5.0)
Alkaline Phosphatase: 70 U/L (ref 38–126)
Anion gap: 9 (ref 5–15)
BUN: 11 mg/dL (ref 6–20)
CO2: 25 mmol/L (ref 22–32)
Calcium: 9.1 mg/dL (ref 8.9–10.3)
Chloride: 102 mmol/L (ref 98–111)
Creatinine, Ser: 0.96 mg/dL (ref 0.61–1.24)
GFR calc Af Amer: >60 mL/min (ref >60)
GFR calc non Af Amer: >60 mL/min (ref >60)
Glucose, Bld: 88 mg/dL (ref 70–99)
Potassium: 4.1 mmol/L (ref 3.5–5.1)
Sodium: 136 mmol/L (ref 135–145)
Total Bilirubin: 1.1 mg/dL (ref 0.3–1.2)
Total Protein: 7.2 g/dL (ref 6.5–8.1)

## 2018-10-14 LAB — CBC WITH DIFFERENTIAL/PLATELET
Abs Immature Granulocytes: 0.01 10*3/uL (ref 0.00–0.07)
Basophils Absolute: 0.1 10*3/uL (ref 0.0–0.1)
Basophils Relative: 1 %
Eosinophils Absolute: 0.2 10*3/uL (ref 0.0–0.5)
Eosinophils Relative: 4 %
HCT: 43.9 % (ref 39.0–52.0)
Hemoglobin: 14.9 g/dL (ref 13.0–17.0)
Immature Granulocytes: 0 %
Lymphocytes Relative: 38 %
Lymphs Abs: 2.2 10*3/uL (ref 0.7–4.0)
MCH: 29.9 pg (ref 26.0–34.0)
MCHC: 33.9 g/dL (ref 30.0–36.0)
MCV: 88.2 fL (ref 80.0–100.0)
Monocytes Absolute: 0.5 10*3/uL (ref 0.1–1.0)
Monocytes Relative: 9 %
Neutro Abs: 2.8 10*3/uL (ref 1.7–7.7)
Neutrophils Relative %: 48 %
Platelets: 205 10*3/uL (ref 150–400)
RBC: 4.98 MIL/uL (ref 4.22–5.81)
RDW: 12.8 % (ref 11.5–15.5)
WBC: 5.7 10*3/uL (ref 4.0–10.5)
nRBC: 0 % (ref 0.0–0.2)

## 2018-10-14 LAB — LACTATE DEHYDROGENASE: LDH: 137 U/L (ref 98–192)

## 2018-10-21 ENCOUNTER — Inpatient Hospital Stay (HOSPITAL_BASED_OUTPATIENT_CLINIC_OR_DEPARTMENT_OTHER): Payer: 59 | Admitting: Nurse Practitioner

## 2018-10-21 ENCOUNTER — Other Ambulatory Visit: Payer: Self-pay

## 2018-10-21 DIAGNOSIS — Z83438 Family history of other disorder of lipoprotein metabolism and other lipidemia: Secondary | ICD-10-CM

## 2018-10-21 DIAGNOSIS — Z809 Family history of malignant neoplasm, unspecified: Secondary | ICD-10-CM

## 2018-10-21 DIAGNOSIS — N200 Calculus of kidney: Secondary | ICD-10-CM | POA: Diagnosis not present

## 2018-10-21 DIAGNOSIS — Z820 Family history of epilepsy and other diseases of the nervous system: Secondary | ICD-10-CM

## 2018-10-21 DIAGNOSIS — E860 Dehydration: Secondary | ICD-10-CM | POA: Diagnosis not present

## 2018-10-21 DIAGNOSIS — Z8249 Family history of ischemic heart disease and other diseases of the circulatory system: Secondary | ICD-10-CM

## 2018-10-21 DIAGNOSIS — K529 Noninfective gastroenteritis and colitis, unspecified: Secondary | ICD-10-CM | POA: Diagnosis not present

## 2018-10-21 DIAGNOSIS — Z79899 Other long term (current) drug therapy: Secondary | ICD-10-CM

## 2018-10-21 DIAGNOSIS — Z880 Allergy status to penicillin: Secondary | ICD-10-CM

## 2018-10-21 DIAGNOSIS — Z8051 Family history of malignant neoplasm of kidney: Secondary | ICD-10-CM

## 2018-10-21 DIAGNOSIS — Z87442 Personal history of urinary calculi: Secondary | ICD-10-CM

## 2018-10-21 DIAGNOSIS — Z881 Allergy status to other antibiotic agents status: Secondary | ICD-10-CM

## 2018-10-21 DIAGNOSIS — R61 Generalized hyperhidrosis: Secondary | ICD-10-CM | POA: Diagnosis not present

## 2018-10-21 DIAGNOSIS — Z886 Allergy status to analgesic agent status: Secondary | ICD-10-CM

## 2018-10-21 DIAGNOSIS — Z8042 Family history of malignant neoplasm of prostate: Secondary | ICD-10-CM

## 2018-10-21 NOTE — Assessment & Plan Note (Addendum)
1.  Night sweats: - Patient was admitted to the hospital from 05/18/2018 through 05/20/2018 for syncopal episode.  2 days prior to that, he started having diarrhea and abdominal cramping and was dehydrated.  Diarrhea stopped 1 day after hospitalization.  Stool work-up was negative.  C. difficile was not done. - CT of the abdomen and pelvis on 05/18/2018 showed mild colitis from distal transverse colon through sigmoid colon with wall thickening but no mesenteric inflammation.  Spleen and liver was normal.  Punctate left nephrolithiasis. -She was discharged home on Augmentin for 10 days. -Last colonoscopy on 02/12/2014 shows diverticulosis.  He is scheduled for a colonoscopy on 07/27/2018 by Dr. Gala Romney. - Denies any fevers or weight loss.  He started having night sweats from 05/21/2018 and had about 3 episodes of drenching night sweats.  Otherwise he had mild night sweats every night when his T-shirt feels wet/damp when he wakes up mostly around the neck and upper chest. -He also had occasional diarrhea with left upper quadrant cramps for the last 10 days.  Also reported history of nausea, vomiting and diarrhea from June through October 2019. -He has 2 pets but denies any tick bites.  No herbal/over-the-counter medications. -Family history significant for father and paternal uncle with prostate cancer in 2 paternal aunts with cancer, 1 of whom had kidney cancer. - Further blood work including LDH, CRP, testosterone, serum chromogranin levels were normal. -24-hour urine for hydroxyindoleacetic acid and metanephrines were normal. -CT of the chest on 06/22/2018 did not show any acute changes. -I have recommended bone marrow biopsy as final work-up.  Since the last visit 10 days ago, he had experienced about 3 episodes of night sweats but no episodes of drenching night sweats. - I have held his bone marrow biopsy on 07/07/2018 as his night sweats have improved from before. - I have discussed the results of his blood  work which showed normal CBC, LDH.  HIV test was negative. -He reports all of his night sweats have resolved and he has not had one since his last visit. -We will discharge him back to his PCP at this time.  If he ever starts having night sweats again he is more than welcome to schedule a follow-up visit.  2.  Prostate cancer: - He underwent robotic prostatectomy on 01/25/2013, Gleason (3+4 is equal to 7). - pT2c/P T3a.  He follows up with Dr. Tresa Moore at [facility] urology. -His PSA was 0.01.

## 2018-10-21 NOTE — Patient Instructions (Signed)
[facility] Discharge Instructions     Thank you for choosing [facility] at [facility] to provide your oncology and hematology care.  To afford each patient quality time with our provider, please arrive at least 15 minutes before your scheduled appointment time.   If you have a lab appointment with the Cancer Center please come in thru the  Main Entrance and check in at the main information desk  You need to re-schedule your appointment should you arrive 10 or more minutes late.  We strive to give you quality time with our providers, and arriving late affects you and other patients whose appointments are after yours.  Also, if you no show three or more times for appointments you may be dismissed from the clinic at the providers discretion.     Again, thank you for choosing [facility].  Our hope is that these requests will decrease the amount of time that you wait before being seen by our physicians.       _____________________________________________________________  Should you have questions after your visit to [facility], please contact our office at [phone] between the hours of 8:00 a.m. and 4:30 p.m.  Voicemails left after 4:00 p.m. will not be returned until the following business day.  For prescription refill requests, have your pharmacy contact our office and allow 72 hours.    Cancer Center Support Programs:   > Cancer Support Group  2nd Tuesday of the month 1pm-2pm, Journey Room    

## 2018-10-21 NOTE — Progress Notes (Signed)
Troy [name], Troy [name]   CLINIC:  Medical Oncology/Hematology  PCP:  Lemmie Evens, MD [place] [address] [phone]   REASON FOR VISIT: Follow-up for night sweats  CURRENT THERAPY: observation   INTERVAL HISTORY:  Troy [name] 63 y.o. male returns for routine follow-up for night sweats.  He denies any further night sweats since before his last visit.  Denies any easy bruising. Denies any nausea, vomiting, or diarrhea. Denies any new pains. Had not noticed any recent bleeding such as epistaxis, hematuria or hematochezia. Denies recent chest pain on exertion, shortness of breath on minimal exertion, pre-syncopal episodes, or palpitations. Denies any numbness or tingling in hands or feet. Denies any recent fevers, infections, or recent hospitalizations. Patient reports appetite at 75 % and energy level at 75 %. He is eating well and maintaining his weight at this time.     REVIEW OF SYSTEMS:  Review of Systems  All other systems reviewed and are negative.    PAST MEDICAL/SURGICAL HISTORY:  Past Medical History:  Diagnosis Date  . Arthritis    back  . Cancer [facility])    prostate cancer  . Diverticula of colon    left side.  Marland Kitchen GERD (gastroesophageal reflux disease)    food related  . Herniated disc    3 herniated discs in neck  . History of kidney stones   . Seasonal allergies    Past Surgical History:  Procedure Laterality Date  . BIOPSY  07/27/2018   Procedure: BIOPSY;  Surgeon: Daneil Dolin, MD;  [facility];  Service: Endoscopy;;  . COLONOSCOPY  01/09/2008   Dr.Rourk- normal rectum, L sided diverticulum. colonic mucosa and terminal ileum mucosa appeared normal.  . COLONOSCOPY  1996   colonic adenomas  . COLONOSCOPY  2001   L sided diverticula  . COLONOSCOPY N/A 02/12/2014   Dr. Gala Romney: diverticulosis  . COLONOSCOPY N/A 07/27/2018   Procedure: COLONOSCOPY;  Surgeon: Daneil Dolin,  MD;  [facility];  Service: Endoscopy;  Laterality: N/A;  2:00pm  . HERNIA REPAIR Left 2008  . LITHOTRIPSY     x2  . NECK SURGERY    . ROBOT ASSISTED LAPAROSCOPIC RADICAL PROSTATECTOMY N/A 01/25/2013   Procedure: ROBOTIC ASSISTED LAPAROSCOPIC RADICAL PROSTATECTOMY LEVEL 1;  Surgeon: Molli Hazard, MD;  Location: [facility];  Service: Urology;  Laterality: N/A;     SOCIAL HISTORY:  Social History   Socioeconomic History  . Marital status: Married    Spouse name: Ferd Horrigan  . Number of children: 2  . Years of education: Not on file  . Highest education level: Not on file  Occupational History  . 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 Financial resource strain: Somewhat hard  . Food insecurity    Worry: Never true    Inability: Never true  . Transportation needs    Medical: No    Non-medical: No  Tobacco Use  . Smoking status: Never Smoker  . Smokeless tobacco: Never Used  Substance and Sexual Activity  . Alcohol use: No  . Drug use: No  . Sexual activity: Not on file  Lifestyle  . Physical activity    Days per week: 0 days    Minutes per session: 0 min  . Stress: Only a little  Relationships  . Social connections    Talks on phone: More than three times a  week    Gets together: More than three times a week    Attends religious service: 1 to 4 times per year    Active member of club or organization: No    Attends meetings of clubs or organizations: Never    Relationship status: Married  . Intimate partner violence    Fear of current or ex partner: No    Emotionally abused: No    Physically abused: No    Forced sexual activity: No  Other Topics Concern  . Not on file  Social History Narrative  . Not on file    FAMILY HISTORY:  Family History  Problem Relation Age of Onset  . Dementia Mother   . CAD Father   . Prostate cancer Father   . Hypertension Brother   . Hyperlipidemia Brother   . Prostate  cancer Paternal Uncle   . CAD Paternal Aunt   . Kidney cancer Paternal Aunt   . Cancer Paternal Aunt   . Dementia Maternal Grandmother   . Colon cancer Neg Hx   . Colon polyps Neg Hx     CURRENT MEDICATIONS:  Outpatient Encounter Medications as of 10/21/2018  Medication Sig  . atorvastatin (LIPITOR) 20 MG tablet Take 20 mg by mouth daily.  . cetirizine (ZYRTEC) 10 MG tablet Take 10 mg by mouth daily.   Marland Kitchen ibuprofen (ADVIL,MOTRIN) 200 MG tablet Take 2 tablets (400 mg total) by mouth every 8 (eight) hours as needed for mild pain or moderate pain.  Marland Kitchen losartan (COZAAR) 25 MG tablet Take 25 mg by mouth daily.   . Multiple Vitamin (MULTIVITAMIN WITH MINERALS) TABS tablet Take 1 tablet by mouth daily.  . Na Sulfate-K Sulfate-Mg Sulf 17.5-3.13-1.6 GM/177ML SOLN Take 1 kit by mouth as directed.  Vladimir Faster Glycol-Propyl Glycol (SYSTANE OP) Place 1 drop into both eyes 2 (two) times daily as needed (dry eyes).  . saccharomyces boulardii (FLORASTOR) 250 MG capsule Take 1 capsule (250 mg total) by mouth 2 (two) times daily.  . tadalafil (CIALIS) 5 MG tablet Take 5 mg by mouth as needed for erectile dysfunction.  . traMADol (ULTRAM) 50 MG tablet Take 50 mg by mouth as needed.    No facility-administered encounter medications on file as of 10/21/2018.     ALLERGIES:  Allergies  Allergen Reactions  . Penicillins Other (See Comments)    Did it involve swelling of the face/tongue/throat, SOB, or low BP? No Did it involve sudden or severe rash/hives, skin peeling, or any reaction on the inside of your mouth or nose? No Did you need to seek medical attention at a hospital or doctor's office? No When did it last happen?Over 10 years If all above answers are "NO", may proceed with cephalosporin use.    Numbness around mouth   . Ciprofloxacin Rash  . Darvocet [Propoxyphene N-Acetaminophen] Rash  . Tylenol [Acetaminophen] Rash     PHYSICAL EXAM:  ECOG Performance status: 1  Vitals:    10/21/18 1011  BP: 131/78  Pulse: 71  Resp: 18  Temp: 98.4 F (36.9 C)  SpO2: 98%   Filed Weights   10/21/18 1011  Weight: 171 lb 11.2 oz (77.9 kg)    Physical Exam Constitutional:      Appearance: Normal appearance. He is normal weight.  Cardiovascular:     Rate and Rhythm: Normal rate and regular rhythm.     Heart sounds: Normal heart sounds.  Pulmonary:     Effort: Pulmonary effort is normal.  Breath sounds: Normal breath sounds.  Abdominal:     General: Bowel sounds are normal.     Palpations: Abdomen is soft.  Musculoskeletal: Normal range of motion.  Skin:    General: Skin is warm and dry.  Neurological:     Mental Status: He is alert and oriented to person, place, and time. Mental status is at baseline.  Psychiatric:        Mood and Affect: Mood normal.        Behavior: Behavior normal.        Thought Content: Thought content normal.        Judgment: Judgment normal.      LABORATORY DATA:  I have reviewed the labs as listed.  CBC    Component Value Date/Time   WBC 5.7 10/14/2018 1126   RBC 4.98 10/14/2018 1126   HGB 14.9 10/14/2018 1126   HCT 43.9 10/14/2018 1126   PLT 205 10/14/2018 1126   MCV 88.2 10/14/2018 1126   MCH 29.9 10/14/2018 1126   MCHC 33.9 10/14/2018 1126   RDW 12.8 10/14/2018 1126   LYMPHSABS 2.2 10/14/2018 1126   MONOABS 0.5 10/14/2018 1126   EOSABS 0.2 10/14/2018 1126   BASOSABS 0.1 10/14/2018 1126   CMP Latest Ref Rng & Units 10/14/2018 05/20/2018 05/19/2018  Glucose 70 - 99 mg/dL 88 93 129(H)  BUN 6 - 20 mg/dL _0 Creatinine 0.61 - 1.24 mg/dL 0.96 0.83 0.98  Sodium 135 - 145 mmol/L 136 139 139  Potassium 3.5 - 5.1 mmol/L 4.1 3.8 4.7  Chloride 98 - 111 mmol/L 102 111 112(H)  CO2 22 - 32 mmol/L 25 23 19(L)  Calcium 8.9 - 10.3 mg/dL 9.1 8.3(L) 7.6(L)  Total Protein 6.5 - 8.1 g/dL 7.2 - 5.8(L)  Total Bilirubin 0.3 - 1.2 mg/dL 1.1 - 1.0  Alkaline Phos 38 - 126 U/L 70 - 53  AST 15 - 41 U/L 27 - 30  ALT 0 - 44 U/L 30 - 35    DIAGNOSTIC IMAGING:  I have independently reviewed the scans and discussed with the patient.  I personally performed a face-to-face visit.  All questions were answered to patient's stated satisfaction. Encouraged patient to call with any new concerns or questions before his next visit to the cancer center and we can certain see him sooner, if needed.      ASSESSMENT & PLAN:   Night sweats 1.  Night sweats: - Patient was admitted to the hospital from 05/18/2018 through 05/20/2018 for syncopal episode.  2 days prior to that, he started having diarrhea and abdominal cramping and was dehydrated.  Diarrhea stopped 1 day after hospitalization.  Stool work-up was negative.  C. difficile was not done. - CT of the abdomen and pelvis on 05/18/2018 showed mild colitis from distal transverse colon through sigmoid colon with wall thickening but no mesenteric inflammation.  Spleen and liver was normal.  Punctate left nephrolithiasis. -She was discharged home on Augmentin for 10 days. -Last colonoscopy on 02/12/2014 shows diverticulosis.  He is scheduled for a colonoscopy on 07/27/2018 by Dr. Gala Romney. - Denies any fevers or weight loss.  He started having night sweats from 05/21/2018 and had about 3 episodes of drenching night sweats.  Otherwise he had mild night sweats every night when his T-shirt feels wet/damp when he wakes up mostly around the neck and upper chest. -He also had occasional diarrhea with left upper quadrant cramps for the last 10 days.  Also reported history of nausea, vomiting  and diarrhea from June through October 2019. -He has 2 pets but denies any tick bites.  No herbal/over-the-counter medications. -Family history significant for father and paternal uncle with prostate cancer in 2 paternal aunts with cancer, 1 of whom had kidney cancer. - Further blood work including LDH, CRP, testosterone, serum chromogranin levels were normal. -24-hour urine for hydroxyindoleacetic acid and metanephrines  were normal. -CT of the chest on 06/22/2018 did not show any acute changes. -I have recommended bone marrow biopsy as final work-up.  Since the last visit 10 days ago, he had experienced about 3 episodes of night sweats but no episodes of drenching night sweats. - I have held his bone marrow biopsy on 07/07/2018 as his night sweats have improved from before. - I have discussed the results of his blood work which showed normal CBC, LDH.  HIV test was negative. -He reports all of his night sweats have resolved and he has not had one since his last visit. -We will discharge him back to his PCP at this time.  If he ever starts having night sweats again he is more than welcome to schedule a follow-up visit.  2.  Prostate cancer: - He underwent robotic prostatectomy on 01/25/2013, Gleason (3+4 is equal to 7). - pT2c/P T3a.  He follows up with Dr. Tresa Moore at [facility]. -His PSA was 0.01.       Orders placed this encounter:  No orders of the defined types were placed in this encounter.     Francene Finders, FNP-C [place] [phone]

## 2018-10-26 ENCOUNTER — Telehealth: Payer: Self-pay | Admitting: *Deleted

## 2018-10-26 NOTE — Telephone Encounter (Signed)
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 Pt has had 4 flares since his TCS(1 April, 1 May, 2 July). When flares start, pt has diarrhea and vomiting. Pt isn't taking anything for reflux on a daily basis. Pt will take otc Nexium or a tums when he's flared because it starts out as belching and burning sensations before the vomiting starts. No burning sensations on a daily basis, on when flared. Pt would like a medication for nausea and diarrhea when he has a flare. Pt did notice he has a flare after eating steak, hamburgers or a susage biscuit. Please advise.

## 2018-10-26 NOTE — Telephone Encounter (Signed)
Pt said that he had TCS 07/27/2018.  Pt had 4 colitis flare ups since then.  Has vomiting and diarrhea when it flares. Wants to know if we can send something in to [facility] or recommend something?  [phone]

## 2018-10-27 MED ORDER — HYOSCYAMINE SULFATE SL 0.125 MG SL SUBL: 1.0000 | SUBLINGUAL_TABLET | Freq: Three times a day (TID) | SUBLINGUAL | 3 refills | Status: DC | PRN

## 2018-10-27 NOTE — Telephone Encounter (Signed)
Needs to take Nexium daily, not prn. Colonoscopy with resolving colitis, no IBD. Will treat supportively for now. Sending in Levsin to take prn abdominal cramping/diarrhea. Keep stool diary and bring to office visit. Avoid fatty foods, fried foods. Likely IBS-related.

## 2018-10-27 NOTE — Addendum Note (Signed)
Addended by: Annitta Needs on: 10/27/2018 12:24 PM   Modules accepted: Orders

## 2018-10-27 NOTE — Telephone Encounter (Signed)
Routing message to AB 

## 2018-10-27 NOTE — Telephone Encounter (Signed)
Noted. Pt notified of AB's recommendations.

## 2019-07-10 ENCOUNTER — Other Ambulatory Visit: Payer: Self-pay

## 2019-07-10 ENCOUNTER — Ambulatory Visit: Payer: 59 | Attending: Internal Medicine

## 2019-07-10 DIAGNOSIS — Z20822 Contact with and (suspected) exposure to covid-19: Secondary | ICD-10-CM

## 2019-07-11 ENCOUNTER — Telehealth: Payer: Self-pay | Admitting: *Deleted

## 2019-07-11 LAB — NOVEL CORONAVIRUS, NAA: SARS-CoV-2, NAA: NOT DETECTED

## 2019-07-11 LAB — SARS-COV-2, NAA 2 DAY TAT

## 2019-07-11 NOTE — Telephone Encounter (Signed)
Pt called in requesting result of his COVID-19 test.   I let him know it was still being processed.  He is active on MyChart so I let hm know his result would show up there also. He thanked me for my help.

## 2020-06-17 ENCOUNTER — Other Ambulatory Visit: Payer: Self-pay | Admitting: Gastroenterology

## 2020-06-19 ENCOUNTER — Telehealth: Payer: Self-pay | Admitting: Internal Medicine

## 2020-06-19 NOTE — Telephone Encounter (Signed)
Routing to rga refill box. Levsin 1 tab q 8 hours as needed.

## 2020-06-19 NOTE — Telephone Encounter (Signed)
PATIENT CALLED AND SAID THAT [facility] SENT REFILL REQUEST HERE FOR HIS [place] Monday, AND THEY HAVE NOT HEARD FROM [place]

## 2020-06-21 NOTE — Telephone Encounter (Signed)
I will send in a limited supply, but he hasn't been here in 2+ years and will need a follow-up visit further refills.

## 2020-06-21 NOTE — Telephone Encounter (Signed)
Lmom, waiting on a return call.  

## 2020-06-21 NOTE — Telephone Encounter (Signed)
Noted. Lm for pt.

## 2020-06-21 NOTE — Telephone Encounter (Signed)
Noted  

## 2020-06-21 NOTE — Telephone Encounter (Signed)
Lmom, pt is aware that RX was sent to his pharmacy.

## 2020-06-21 NOTE — Telephone Encounter (Signed)
Refilled by refill box. I wasn't in the office Monday when the sent the request.

## 2020-08-23 ENCOUNTER — Other Ambulatory Visit (HOSPITAL_COMMUNITY): Payer: Self-pay | Admitting: Nurse Practitioner

## 2020-08-23 ENCOUNTER — Ambulatory Visit (HOSPITAL_COMMUNITY)
Admission: RE | Admit: 2020-08-23 | Discharge: 2020-08-23 | Disposition: A | Discharge status: Home / Self Care | Payer: 59 | Source: Ambulatory Visit | Attending: Nurse Practitioner | Admitting: Nurse Practitioner

## 2020-08-23 ENCOUNTER — Other Ambulatory Visit: Payer: Self-pay

## 2020-08-23 DIAGNOSIS — M549 Dorsalgia, unspecified: Secondary | ICD-10-CM

## 2020-08-23 DIAGNOSIS — M542 Cervicalgia: Secondary | ICD-10-CM | POA: Insufficient documentation

## 2020-08-27 ENCOUNTER — Other Ambulatory Visit (HOSPITAL_COMMUNITY): Payer: Self-pay | Admitting: Nurse Practitioner

## 2020-08-27 DIAGNOSIS — I6523 Occlusion and stenosis of bilateral carotid arteries: Secondary | ICD-10-CM

## 2020-09-05 ENCOUNTER — Ambulatory Visit (HOSPITAL_COMMUNITY)
Admission: RE | Admit: 2020-09-05 | Discharge: 2020-09-05 | Disposition: A | Discharge status: Home / Self Care | Payer: 59 | Source: Ambulatory Visit | Attending: Nurse Practitioner | Admitting: Nurse Practitioner

## 2020-09-05 DIAGNOSIS — I6523 Occlusion and stenosis of bilateral carotid arteries: Secondary | ICD-10-CM | POA: Insufficient documentation

## 2020-09-09 ENCOUNTER — Other Ambulatory Visit: Payer: Self-pay | Admitting: Family Medicine

## 2020-09-09 ENCOUNTER — Other Ambulatory Visit (HOSPITAL_COMMUNITY): Payer: Self-pay | Admitting: Family Medicine

## 2020-09-09 DIAGNOSIS — I6521 Occlusion and stenosis of right carotid artery: Secondary | ICD-10-CM

## 2020-09-27 ENCOUNTER — Other Ambulatory Visit: Payer: Self-pay

## 2020-09-27 ENCOUNTER — Ambulatory Visit (HOSPITAL_COMMUNITY)
Admission: RE | Admit: 2020-09-27 | Discharge: 2020-09-27 | Disposition: A | Discharge status: Home / Self Care | Payer: 59 | Source: Ambulatory Visit | Attending: Family Medicine | Admitting: Family Medicine

## 2020-09-27 DIAGNOSIS — I6521 Occlusion and stenosis of right carotid artery: Secondary | ICD-10-CM | POA: Insufficient documentation

## 2020-09-27 LAB — POCT I-STAT CREATININE: Creatinine, Ser: 1.1 mg/dL (ref 0.61–1.24)

## 2020-09-27 MED ORDER — IOHEXOL 350 MG/ML SOLN
75.0000 mL | Freq: Once | INTRAVENOUS | Status: AC | PRN
  Administered 2020-09-27: 75 mL via INTRAVENOUS

## 2020-10-08 ENCOUNTER — Encounter: Payer: Self-pay | Admitting: Vascular Surgery

## 2020-10-08 ENCOUNTER — Other Ambulatory Visit: Payer: Self-pay

## 2020-10-08 ENCOUNTER — Ambulatory Visit: Payer: 59 | Admitting: Vascular Surgery

## 2020-10-08 DIAGNOSIS — I6521 Occlusion and stenosis of right carotid artery: Secondary | ICD-10-CM

## 2020-10-08 DIAGNOSIS — I6529 Occlusion and stenosis of unspecified carotid artery: Secondary | ICD-10-CM | POA: Insufficient documentation

## 2020-10-08 NOTE — Progress Notes (Signed)
Patient name: Troy [name] MRN: [id number] DOB: Mar 03, 1956 Sex: male  REASON FOR CONSULT: Evaluate 90% right carotid stenosis  HPI: Troy [name] is a 65 y.o. male, with history of hypertension and prostate cancer who presents for evaluation of 90% right carotid stenosis.  Patient states he was getting an x-ray to look at his cervical fusion given he was having some back and neck pain.  Ultimately was noted to have some calcification and was sent for ultrasound study.  He had an ultrasound of his carotids on 09/05/2020 that showed a 70-99% right ICA stenosis.  He then was sent for CTA neck by his PCP and on 09/28/20 had evidence of a 90% proximal right ICA stenosis.  He denies any previous neck radiation.  He is on a statin and started an aspirin.  He denies any previous history of stroke or TIA.  He has had no neurologic events.  Past Medical History:  Diagnosis Date   Arthritis    back   Cancer [facility])    prostate cancer   Diverticula of colon    left side.   GERD (gastroesophageal reflux disease)    food related   Herniated disc    3 herniated discs in neck   History of kidney stones    Seasonal allergies     Past Surgical History:  Procedure Laterality Date   BIOPSY  07/27/2018   Procedure: BIOPSY;  Surgeon: Daneil Dolin, MD;  [facility];  Service: Endoscopy;;   COLONOSCOPY  01/09/2008   Dr.Rourk- normal rectum, L sided diverticulum. colonic mucosa and terminal ileum mucosa appeared normal.   COLONOSCOPY  1996   colonic adenomas   COLONOSCOPY  2001   L sided diverticula   COLONOSCOPY N/A 02/12/2014   Dr. Gala Romney: diverticulosis   COLONOSCOPY N/A 07/27/2018   Procedure: COLONOSCOPY;  Surgeon: Daneil Dolin, MD;  [facility];  Service: Endoscopy;  Laterality: N/A;  2:00pm   HERNIA REPAIR Left 2008   LITHOTRIPSY     x2   NECK SURGERY     ROBOT ASSISTED LAPAROSCOPIC RADICAL PROSTATECTOMY N/A 01/25/2013   Procedure: ROBOTIC ASSISTED  LAPAROSCOPIC RADICAL PROSTATECTOMY LEVEL 1;  Surgeon: Molli Hazard, MD;  Location[facility];  Service: Urology;  Laterality: N/A;    Family History  Problem Relation Age of Onset   Dementia Mother    CAD Father    Prostate cancer Father    Hypertension Brother    Hyperlipidemia Brother    Prostate cancer Paternal Uncle    CAD Paternal Aunt    Kidney cancer Paternal Aunt    Cancer Paternal Aunt    Dementia Maternal Grandmother    Colon cancer Neg Hx    Colon polyps Neg Hx     SOCIAL HISTORY: Social History   Socioeconomic History   Marital status: Married    Spouse name: Loyde Orth   Number of children: 2   Years of education: Not on file   Highest education level: Not on file  Occupational History   Occupation: retired    Comment: Research officer, trade union in [place]   Tobacco Use   Smoking status: Never   Smokeless tobacco: Never  Substance and Sexual Activity   Alcohol use: No   Drug use: No   Sexual activity: Not on file  Other Topics Concern   Not on file  Social History Narrative   Not on file   Social Determinants of Radio broadcast assistant  Strain: Not on file  Food Insecurity: Not on file  Transportation Needs: Not on file  Physical Activity: Not on file  Stress: Not on file  Social Connections: Not on file  Intimate Partner Violence: Not on file    Allergies  Allergen Reactions   Penicillins Other (See Comments)    Did it involve swelling of the face/tongue/throat, SOB, or low BP? No Did it involve sudden or severe rash/hives, skin peeling, or any reaction on the inside of your mouth or nose? No Did you need to seek medical attention at a hospital or doctor's office? No When did it last happen?      Over 10 years If all above answers are "NO", may proceed with cephalosporin use.    Numbness around mouth    Ciprofloxacin Rash   Darvocet [Propoxyphene N-Acetaminophen] Rash   Tylenol [Acetaminophen] Rash    Current Outpatient Medications   Medication Sig Dispense Refill   atorvastatin (LIPITOR) 20 MG tablet Take 20 mg by mouth daily.     cetirizine (ZYRTEC) 10 MG tablet Take 10 mg by mouth daily.     clonazePAM (KLONOPIN) 1 MG tablet Take 1 mg by mouth 2 (two) times daily.     hyoscyamine (LEVSIN SL) 0.125 MG SL tablet PLACE 1 TABLET UNDER THE TONGUE EVERY 8 (EIGHT) HOURS AS NEEDED. ABDOMINAL CRAMPING AND DIARRHEA. 30 tablet 2   ibuprofen (ADVIL,MOTRIN) 200 MG tablet Take 2 tablets (400 mg total) by mouth every 8 (eight) hours as needed for mild pain or moderate pain.     losartan (COZAAR) 25 MG tablet Take 25 mg by mouth daily.      Multiple Vitamin (MULTIVITAMIN WITH MINERALS) TABS tablet Take 1 tablet by mouth daily.     naproxen (NAPROSYN) 500 MG tablet Take 500 mg by mouth 2 (two) times daily.     Polyethyl Glycol-Propyl Glycol (SYSTANE OP) Place 1 drop into both eyes 2 (two) times daily as needed (dry eyes).     tadalafil (CIALIS) 5 MG tablet Take 5 mg by mouth as needed for erectile dysfunction.     Na Sulfate-K Sulfate-Mg Sulf 17.5-3.13-1.6 GM/177ML SOLN Take 1 kit by mouth as directed. 1 Bottle 0   saccharomyces boulardii (FLORASTOR) 250 MG capsule Take 1 capsule (250 mg total) by mouth 2 (two) times daily. 60 capsule 0   traMADol (ULTRAM) 50 MG tablet Take 50 mg by mouth as needed.      No current facility-administered medications for this visit.    REVIEW OF SYSTEMS:  '[X]'  denotes positive finding, '[ ]'  denotes negative finding Cardiac  Comments:  Chest pain or chest pressure:    Shortness of breath upon exertion:    Short of breath when lying flat:    Irregular heart rhythm:        Vascular    Pain in calf, thigh, or hip brought on by ambulation:    Pain in feet at night that wakes you up from your sleep:     Blood clot in your veins:    Leg swelling:         Pulmonary    Oxygen at home:    Productive cough:     Wheezing:         Neurologic    Sudden weakness in arms or legs:     Sudden numbness in  arms or legs:     Sudden onset of difficulty speaking or slurred speech:    Temporary loss of vision in one  eye:     Problems with dizziness:         Gastrointestinal    Blood in stool:     Vomited blood:         Genitourinary    Burning when urinating:     Blood in urine:        Psychiatric    Major depression:         Hematologic    Bleeding problems:    Problems with blood clotting too easily:        Skin    Rashes or ulcers:        Constitutional    Fever or chills:      PHYSICAL EXAM: Vitals:   10/08/20 1114 10/08/20 1117  BP: 136/83 (!) 146/83  Pulse: 73 84  Resp: 16   Temp: 98.3 F (36.8 C)   TempSrc: Temporal   SpO2: 98%   Weight: 189 lb (85.7 kg)   Height: '5\' 8"'  (1.727 m)     GENERAL: The patient is a well-nourished male, in no acute distress. The vital signs are documented above. CARDIAC: There is a regular rate and rhythm.  VASCULAR:  Some limited mobility with range of motion in the neck PULMONARY: No respiratory distress. ABDOMEN: Soft and non-tender. MUSCULOSKELETAL: There are no major deformities or cyanosis. NEUROLOGIC: No focal weakness or paresthesias are detected. SKIN: There are no ulcers or rashes noted. PSYCHIATRIC: The patient has a normal affect.  DATA:   CTA neck reviewed 09/28/20 and high-grade greater than 90% right ICA stenosis in the proximal ICA.  No significant left carotid stenosis.  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  This is an asymptomatic lesion given that it was found incidentally and he has no history of stroke or TIA.  Discussed that in the setting of asymptomatic disease recommend intervention for greater than 80% stenosis.  I have recommended right carotid endarterectomy.  We talked about this being for stroke risk reduction.  I talked about stroke risk of 1% as well as risk of cranial nerve injury, bleeding, infection, risk of  anesthesia etc.  Will get him scheduled today,.   Marty Heck, MD [facility] Office: [phone]

## 2020-10-16 ENCOUNTER — Telehealth (HOSPITAL_COMMUNITY): Payer: Self-pay

## 2020-10-16 NOTE — Telephone Encounter (Signed)
FMLA Paperwork faxed on 10/16/20 to [employer]: Benny Lennert Fax: [phone]. Copy of FMLA paperwork picked up by wife Juvenal Umar on 10/16/20. Confirmation received.

## 2020-10-16 NOTE — Progress Notes (Signed)
Surgical Instructions    Your procedure is scheduled on October 21, 2020.  Report to [facility] Main Entrance "A" at 08:00 A.M., then check in with the Admitting office.  Call this number if you have problems the morning of surgery:  [phone]   If you have any questions prior to your surgery date call [phone]: Open Monday-Friday 8am-4pm   Remember:  Do not eat or drink after midnight the night before your surgery    Take these medicines the morning of surgery with A SIP OF WATER : Aspirin Atorvastatin (Lipitor) Cetirizine (Zyrtec) Esomeprazole (Nexium)  If needed: Clonazepam (Klonopin) Hyoscyamine (Levsin)  As of today, STOP taking any Aleve, Naproxen, Ibuprofen, Motrin, Advil, Goody's, BC's, all herbal medications, fish oil, and all vitamins.                   Do not wear jewelry. Do not wear lotions, powders, colognes, or deodorant. Do not shave 48 hours prior to surgery.  Men may shave face and neck. Do not bring valuables to the hospital.   [facility] is not responsible for any belongings or valuables.  Do NOT Smoke (Tobacco/Vaping) or drink Alcohol 24 hours prior to your procedure If you use a CPAP at night, you may bring all equipment for your overnight stay.   Contacts, glasses, dentures or bridgework may not be worn into surgery, please bring cases for these belongings   For patients admitted to the hospital, discharge time will be determined by your treatment team.   Patients discharged the day of surgery will not be allowed to drive home, and someone needs to stay with them for 24 hours.    Special instructions:   [facility]- Preparing For Surgery  Before surgery, you can play an important role. Because skin is not sterile, your skin needs to be as free of germs as possible. You can reduce the number of germs on your skin by washing with CHG (chlorahexidine gluconate) Soap before surgery.  CHG is an antiseptic cleaner which kills germs and bonds with  the skin to continue killing germs even after washing.    Oral Hygiene is also important to reduce your risk of infection.  Remember - BRUSH YOUR TEETH THE MORNING OF SURGERY WITH YOUR REGULAR TOOTHPASTE  Please do not use if you have an allergy to CHG or antibacterial soaps. If your skin becomes reddened/irritated stop using the CHG.  Do not shave (including legs and underarms) for at least 48 hours prior to first CHG shower. It is OK to shave your face.  Please follow these instructions carefully.   Shower the NIGHT BEFORE SURGERY and the MORNING OF SURGERY  If you chose to wash your hair, wash your hair first as usual with your normal shampoo.  After you shampoo, rinse your hair and body thoroughly to remove the shampoo.  Wash Face and genitals (private parts) with your normal soap.   Use CHG Soap as you would any other liquid soap. You can apply CHG directly to the skin and wash gently with a scrungie or a clean washcloth.   Apply the CHG Soap to your body ONLY FROM THE NECK DOWN.  Do not use on open wounds or open sores. Avoid contact with your eyes, ears, mouth and genitals (private parts).   Wash thoroughly, paying special attention to the area where your surgery will be performed.  Thoroughly rinse your body with warm water from the neck down.  DO NOT shower/wash with your  normal soap after using and rinsing off the CHG Soap.  Pat yourself dry with a CLEAN TOWEL.  Wear CLEAN PAJAMAS to bed the night before surgery  Place CLEAN SHEETS on your bed the night before your surgery  DO NOT SLEEP WITH PETS.   Day of Surgery: Take a shower with CHG soap as directed above. Wear Clean/Comfortable clothing the morning of surgery Do not apply any deodorants/lotions.   Remember to brush your teeth WITH YOUR REGULAR TOOTHPASTE.   Please read over the following fact sheets that you were given.

## 2020-10-17 ENCOUNTER — Other Ambulatory Visit (HOSPITAL_COMMUNITY): Payer: 59

## 2020-10-17 ENCOUNTER — Other Ambulatory Visit: Payer: Self-pay

## 2020-10-17 ENCOUNTER — Encounter (HOSPITAL_COMMUNITY)
Admission: RE | Admit: 2020-10-17 | Discharge: 2020-10-17 | Disposition: A | Discharge status: Home / Self Care | Payer: 59 | Source: Ambulatory Visit | Attending: Vascular Surgery | Admitting: Vascular Surgery

## 2020-10-17 ENCOUNTER — Encounter (HOSPITAL_COMMUNITY): Payer: Self-pay

## 2020-10-17 DIAGNOSIS — I447 Left bundle-branch block, unspecified: Secondary | ICD-10-CM | POA: Insufficient documentation

## 2020-10-17 DIAGNOSIS — Z01818 Encounter for other preprocedural examination: Secondary | ICD-10-CM | POA: Diagnosis not present

## 2020-10-17 DIAGNOSIS — Z20822 Contact with and (suspected) exposure to covid-19: Secondary | ICD-10-CM | POA: Diagnosis not present

## 2020-10-17 HISTORY — DX: Nausea with vomiting, unspecified: R11.2

## 2020-10-17 HISTORY — DX: Left bundle-branch block, unspecified: I44.7

## 2020-10-17 HISTORY — DX: Anxiety disorder, unspecified: F41.9

## 2020-10-17 HISTORY — DX: Essential (primary) hypertension: I10

## 2020-10-17 HISTORY — DX: Other complications of anesthesia, initial encounter: T88.59XA

## 2020-10-17 HISTORY — DX: Other specified postprocedural states: Z98.890

## 2020-10-17 LAB — URINALYSIS, ROUTINE W REFLEX MICROSCOPIC
Bilirubin Urine: NEGATIVE
Glucose, UA: NEGATIVE mg/dL
Hgb urine dipstick: NEGATIVE
Ketones, ur: NEGATIVE mg/dL
Leukocytes,Ua: NEGATIVE
Nitrite: NEGATIVE
Protein, ur: NEGATIVE mg/dL
Specific Gravity, Urine: 1.002 — ABNORMAL LOW (ref 1.005–1.030)
pH: 7 (ref 5.0–8.0)

## 2020-10-17 LAB — SURGICAL PCR SCREEN
MRSA, PCR: NEGATIVE
Staphylococcus aureus: NEGATIVE

## 2020-10-17 LAB — CBC
HCT: 44.9 % (ref 39.0–52.0)
Hemoglobin: 15.8 g/dL (ref 13.0–17.0)
MCH: 31.4 pg (ref 26.0–34.0)
MCHC: 35.2 g/dL (ref 30.0–36.0)
MCV: 89.3 fL (ref 80.0–100.0)
Platelets: 198 10*3/uL (ref 150–400)
RBC: 5.03 MIL/uL (ref 4.22–5.81)
RDW: 12.6 % (ref 11.5–15.5)
WBC: 5.7 10*3/uL (ref 4.0–10.5)
nRBC: 0 % (ref 0.0–0.2)

## 2020-10-17 LAB — COMPREHENSIVE METABOLIC PANEL
ALT: 38 U/L (ref 0–44)
AST: 30 U/L (ref 15–41)
Albumin: 4.2 g/dL (ref 3.5–5.0)
Alkaline Phosphatase: 76 U/L (ref 38–126)
Anion gap: 7 (ref 5–15)
BUN: 7 mg/dL — ABNORMAL LOW (ref 8–23)
CO2: 25 mmol/L (ref 22–32)
Calcium: 9.4 mg/dL (ref 8.9–10.3)
Chloride: 107 mmol/L (ref 98–111)
Creatinine, Ser: 1.02 mg/dL (ref 0.61–1.24)
GFR, Estimated: >60 mL/min (ref >60)
Glucose, Bld: 91 mg/dL (ref 70–99)
Potassium: 4.1 mmol/L (ref 3.5–5.1)
Sodium: 139 mmol/L (ref 135–145)
Total Bilirubin: 1.1 mg/dL (ref 0.3–1.2)
Total Protein: 7 g/dL (ref 6.5–8.1)

## 2020-10-17 LAB — SARS CORONAVIRUS 2 (TAT 6-24 HRS): SARS Coronavirus 2: NEGATIVE

## 2020-10-17 LAB — APTT: aPTT: 31 seconds (ref 24–36)

## 2020-10-17 LAB — TYPE AND SCREEN
ABO/RH(D): A POS
Antibody Screen: NEGATIVE

## 2020-10-17 LAB — PROTIME-INR
INR: 1.2 (ref 0.8–1.2)
Prothrombin Time: 14.8 seconds (ref 11.4–15.2)

## 2020-10-17 NOTE — Progress Notes (Signed)
PCP - Lemmie Evens Cardiologist - pt saw Dr. Harl Bowie "years ago, but not recently"  PPM/ICD - n/a  Chest x-ray - n/a EKG - 10/17/20 in PAT Stress Test - 06/28/14 ECHO - 05/19/18 Cardiac Cath - n/a  Sleep Study - n/a CPAP -   Fasting Blood Sugar - n/a  Blood Thinner Instructions: n/a Aspirin Instructions: continue per MD  ERAS Protcol - no  COVID TEST- 10/17/20 in PAT  Anesthesia review: yes, abnormal EKG (LBBB-pt reports that his doctor has told him he has had this in the past, which is why he saw Dr. Harl Bowie years ago)  Patient denies shortness of breath, fever, cough and chest pain at PAT appointment   All instructions explained to the patient, with a verbal understanding of the material. Patient agrees to go over the instructions while at home for a better understanding. Patient also instructed to self quarantine after being tested for COVID-19. The opportunity to ask questions was provided.

## 2020-10-18 ENCOUNTER — Encounter (HOSPITAL_COMMUNITY): Payer: Self-pay

## 2020-10-21 ENCOUNTER — Inpatient Hospital Stay (HOSPITAL_COMMUNITY): Payer: 59 | Admitting: Physician Assistant

## 2020-10-21 ENCOUNTER — Encounter (HOSPITAL_COMMUNITY): Payer: Self-pay | Admitting: Vascular Surgery

## 2020-10-21 ENCOUNTER — Encounter (HOSPITAL_COMMUNITY)
Admission: RE | Disposition: A | Discharge status: Home / Self Care | Payer: Self-pay | Source: Home / Self Care | Attending: Vascular Surgery

## 2020-10-21 ENCOUNTER — Inpatient Hospital Stay (HOSPITAL_COMMUNITY)
Admission: RE | Admit: 2020-10-21 | Discharge: 2020-10-22 | DRG: 039 | Disposition: A | Discharge status: Home / Self Care | Payer: 59 | Attending: Vascular Surgery | Admitting: Vascular Surgery

## 2020-10-21 ENCOUNTER — Other Ambulatory Visit: Payer: Self-pay

## 2020-10-21 ENCOUNTER — Inpatient Hospital Stay (HOSPITAL_COMMUNITY): Payer: 59

## 2020-10-21 DIAGNOSIS — I10 Essential (primary) hypertension: Secondary | ICD-10-CM | POA: Diagnosis present

## 2020-10-21 DIAGNOSIS — Z83438 Family history of other disorder of lipoprotein metabolism and other lipidemia: Secondary | ICD-10-CM

## 2020-10-21 DIAGNOSIS — Z8546 Personal history of malignant neoplasm of prostate: Secondary | ICD-10-CM

## 2020-10-21 DIAGNOSIS — K219 Gastro-esophageal reflux disease without esophagitis: Secondary | ICD-10-CM | POA: Diagnosis present

## 2020-10-21 DIAGNOSIS — F419 Anxiety disorder, unspecified: Secondary | ICD-10-CM | POA: Diagnosis present

## 2020-10-21 DIAGNOSIS — Z8249 Family history of ischemic heart disease and other diseases of the circulatory system: Secondary | ICD-10-CM

## 2020-10-21 DIAGNOSIS — Z8051 Family history of malignant neoplasm of kidney: Secondary | ICD-10-CM

## 2020-10-21 DIAGNOSIS — Z981 Arthrodesis status: Secondary | ICD-10-CM

## 2020-10-21 DIAGNOSIS — K5730 Diverticulosis of large intestine without perforation or abscess without bleeding: Secondary | ICD-10-CM | POA: Diagnosis present

## 2020-10-21 DIAGNOSIS — I447 Left bundle-branch block, unspecified: Secondary | ICD-10-CM | POA: Diagnosis present

## 2020-10-21 DIAGNOSIS — Z20822 Contact with and (suspected) exposure to covid-19: Secondary | ICD-10-CM | POA: Diagnosis present

## 2020-10-21 DIAGNOSIS — Z886 Allergy status to analgesic agent status: Secondary | ICD-10-CM | POA: Diagnosis not present

## 2020-10-21 DIAGNOSIS — Z8042 Family history of malignant neoplasm of prostate: Secondary | ICD-10-CM

## 2020-10-21 DIAGNOSIS — I6521 Occlusion and stenosis of right carotid artery: Secondary | ICD-10-CM | POA: Diagnosis present

## 2020-10-21 DIAGNOSIS — M1990 Unspecified osteoarthritis, unspecified site: Secondary | ICD-10-CM | POA: Diagnosis present

## 2020-10-21 DIAGNOSIS — Z888 Allergy status to other drugs, medicaments and biological substances status: Secondary | ICD-10-CM | POA: Diagnosis not present

## 2020-10-21 DIAGNOSIS — Z880 Allergy status to penicillin: Secondary | ICD-10-CM

## 2020-10-21 HISTORY — PX: ENDARTERECTOMY: SHX5162

## 2020-10-21 HISTORY — PX: PATCH ANGIOPLASTY: SHX6230

## 2020-10-21 LAB — CBC
HCT: 38.4 % — ABNORMAL LOW (ref 39.0–52.0)
Hemoglobin: 13.7 g/dL (ref 13.0–17.0)
MCH: 31.3 pg (ref 26.0–34.0)
MCHC: 35.7 g/dL (ref 30.0–36.0)
MCV: 87.7 fL (ref 80.0–100.0)
Platelets: 166 10*3/uL (ref 150–400)
RBC: 4.38 MIL/uL (ref 4.22–5.81)
RDW: 12.5 % (ref 11.5–15.5)
WBC: 10.1 10*3/uL (ref 4.0–10.5)
nRBC: 0 % (ref 0.0–0.2)

## 2020-10-21 LAB — CREATININE, SERUM
Creatinine, Ser: 1.06 mg/dL (ref 0.61–1.24)
GFR, Estimated: >60 mL/min (ref >60)

## 2020-10-21 LAB — POCT ACTIVATED CLOTTING TIME: Activated Clotting Time: 265 seconds

## 2020-10-21 SURGERY — ENDARTERECTOMY, CAROTID
Anesthesia: General | Site: Neck | Laterality: Right

## 2020-10-21 MED ORDER — ONDANSETRON HCL 4 MG/2ML IJ SOLN
4.0000 mg | Freq: Four times a day (QID) | INTRAMUSCULAR | Status: DC | PRN
  Administered 2020-10-21: 4 mg via INTRAVENOUS
  Filled 2020-10-21: qty 2

## 2020-10-21 MED ORDER — ASPIRIN EC 81 MG PO TBEC
81.0000 mg | DELAYED_RELEASE_TABLET | Freq: Every day | ORAL | Status: DC
  Administered 2020-10-22: 81 mg via ORAL
  Filled 2020-10-21: qty 1

## 2020-10-21 MED ORDER — MAGNESIUM SULFATE 2 GM/50ML IV SOLN: 2.0000 g | Freq: Every day | INTRAVENOUS | Status: DC | PRN

## 2020-10-21 MED ORDER — HYOSCYAMINE SULFATE 0.125 MG SL SUBL: 0.1250 mg | SUBLINGUAL_TABLET | SUBLINGUAL | Status: DC | PRN

## 2020-10-21 MED ORDER — LIDOCAINE 2% (20 MG/ML) 5 ML SYRINGE
INTRAMUSCULAR | Status: DC | PRN
  Administered 2020-10-21: 80 mg via INTRAVENOUS

## 2020-10-21 MED ORDER — DOCUSATE SODIUM 100 MG PO CAPS
100.0000 mg | ORAL_CAPSULE | Freq: Every day | ORAL | Status: DC
  Administered 2020-10-22: 100 mg via ORAL
  Filled 2020-10-21: qty 1

## 2020-10-21 MED ORDER — LACTATED RINGERS IV SOLN: INTRAVENOUS | Status: DC

## 2020-10-21 MED ORDER — LIDOCAINE HCL (PF) 1 % IJ SOLN
INTRAMUSCULAR | Status: DC | PRN
  Administered 2020-10-21: 5 mL

## 2020-10-21 MED ORDER — ACETAMINOPHEN 325 MG PO TABS: 325.0000 mg | ORAL_TABLET | ORAL | Status: DC | PRN

## 2020-10-21 MED ORDER — LOSARTAN POTASSIUM 25 MG PO TABS
25.0000 mg | ORAL_TABLET | Freq: Every day | ORAL | Status: DC
  Administered 2020-10-22: 25 mg via ORAL
  Filled 2020-10-21: qty 1

## 2020-10-21 MED ORDER — FENTANYL CITRATE (PF) 100 MCG/2ML IJ SOLN: 25.0000 ug | INTRAMUSCULAR | Status: DC | PRN

## 2020-10-21 MED ORDER — EPHEDRINE SULFATE-NACL 50-0.9 MG/10ML-% IV SOSY
PREFILLED_SYRINGE | INTRAVENOUS | Status: DC | PRN
  Administered 2020-10-21 (×3): 2.5 mg via INTRAVENOUS
  Administered 2020-10-21: 5 mg via INTRAVENOUS
  Administered 2020-10-21 (×2): 2.5 mg via INTRAVENOUS

## 2020-10-21 MED ORDER — HEPARIN 6000 UNIT IRRIGATION SOLUTION
Status: AC
  Filled 2020-10-21: qty 500

## 2020-10-21 MED ORDER — PANTOPRAZOLE SODIUM 40 MG PO TBEC
40.0000 mg | DELAYED_RELEASE_TABLET | Freq: Every day | ORAL | Status: DC
  Administered 2020-10-22: 40 mg via ORAL
  Filled 2020-10-21: qty 1

## 2020-10-21 MED ORDER — 0.9 % SODIUM CHLORIDE (POUR BTL) OPTIME
TOPICAL | Status: DC | PRN
  Administered 2020-10-21: 2000 mL

## 2020-10-21 MED ORDER — CHLORHEXIDINE GLUCONATE CLOTH 2 % EX PADS: 6.0000 | MEDICATED_PAD | Freq: Once | CUTANEOUS | Status: DC

## 2020-10-21 MED ORDER — HEPARIN 6000 UNIT IRRIGATION SOLUTION
Status: DC | PRN
  Administered 2020-10-21: 1

## 2020-10-21 MED ORDER — CHLORHEXIDINE GLUCONATE 0.12 % MT SOLN
OROMUCOSAL | Status: AC
  Administered 2020-10-21: 15 mL via OROMUCOSAL
  Filled 2020-10-21: qty 15

## 2020-10-21 MED ORDER — DEXAMETHASONE SODIUM PHOSPHATE 10 MG/ML IJ SOLN
INTRAMUSCULAR | Status: AC
  Filled 2020-10-21: qty 1

## 2020-10-21 MED ORDER — VANCOMYCIN HCL IN DEXTROSE 1-5 GM/200ML-% IV SOLN: 1000.0000 mg | INTRAVENOUS | Status: AC

## 2020-10-21 MED ORDER — METOPROLOL TARTRATE 5 MG/5ML IV SOLN: 2.0000 mg | INTRAVENOUS | Status: DC | PRN

## 2020-10-21 MED ORDER — PROTAMINE SULFATE 10 MG/ML IV SOLN
INTRAVENOUS | Status: AC
  Filled 2020-10-21: qty 10

## 2020-10-21 MED ORDER — HYDRALAZINE HCL 20 MG/ML IJ SOLN: 5.0000 mg | INTRAMUSCULAR | Status: DC | PRN

## 2020-10-21 MED ORDER — LIDOCAINE 2% (20 MG/ML) 5 ML SYRINGE
INTRAMUSCULAR | Status: AC
  Filled 2020-10-21: qty 5

## 2020-10-21 MED ORDER — OXYCODONE HCL 5 MG PO TABS
5.0000 mg | ORAL_TABLET | ORAL | Status: DC | PRN
  Administered 2020-10-21 (×2): 5 mg via ORAL
  Administered 2020-10-22: 10 mg via ORAL
  Filled 2020-10-21: qty 2
  Filled 2020-10-21 (×2): qty 1

## 2020-10-21 MED ORDER — LABETALOL HCL 5 MG/ML IV SOLN: 10.0000 mg | INTRAVENOUS | Status: DC | PRN

## 2020-10-21 MED ORDER — ETOMIDATE 2 MG/ML IV SOLN
INTRAVENOUS | Status: AC
  Filled 2020-10-21: qty 10

## 2020-10-21 MED ORDER — FENTANYL CITRATE (PF) 250 MCG/5ML IJ SOLN
INTRAMUSCULAR | Status: DC | PRN
  Administered 2020-10-21: 150 ug via INTRAVENOUS
  Administered 2020-10-21 (×2): 50 ug via INTRAVENOUS

## 2020-10-21 MED ORDER — POTASSIUM CHLORIDE CRYS ER 20 MEQ PO TBCR: 20.0000 meq | EXTENDED_RELEASE_TABLET | Freq: Every day | ORAL | Status: DC | PRN

## 2020-10-21 MED ORDER — SODIUM CHLORIDE 0.9 % IV SOLN: INTRAVENOUS | Status: DC

## 2020-10-21 MED ORDER — FENTANYL CITRATE (PF) 250 MCG/5ML IJ SOLN
INTRAMUSCULAR | Status: AC
  Filled 2020-10-21: qty 5

## 2020-10-21 MED ORDER — ROCURONIUM BROMIDE 10 MG/ML (PF) SYRINGE
PREFILLED_SYRINGE | INTRAVENOUS | Status: DC | PRN
  Administered 2020-10-21: 70 mg via INTRAVENOUS

## 2020-10-21 MED ORDER — PROPOFOL 10 MG/ML IV BOLUS
INTRAVENOUS | Status: DC | PRN
  Administered 2020-10-21: 150 mg via INTRAVENOUS
  Administered 2020-10-21 (×2): 20 mg via INTRAVENOUS

## 2020-10-21 MED ORDER — SCOPOLAMINE 1 MG/3DAYS TD PT72
MEDICATED_PATCH | TRANSDERMAL | Status: DC | PRN
  Administered 2020-10-21: 1 via TRANSDERMAL

## 2020-10-21 MED ORDER — ATORVASTATIN CALCIUM 40 MG PO TABS
40.0000 mg | ORAL_TABLET | Freq: Every day | ORAL | Status: DC
  Administered 2020-10-22: 40 mg via ORAL
  Filled 2020-10-21: qty 1

## 2020-10-21 MED ORDER — EPHEDRINE 5 MG/ML INJ
INTRAVENOUS | Status: AC
  Filled 2020-10-21: qty 5

## 2020-10-21 MED ORDER — OXYCODONE HCL 5 MG PO TABS: 5.0000 mg | ORAL_TABLET | Freq: Once | ORAL | Status: DC | PRN

## 2020-10-21 MED ORDER — ONDANSETRON HCL 4 MG/2ML IJ SOLN
INTRAMUSCULAR | Status: DC | PRN
  Administered 2020-10-21: 4 mg via INTRAVENOUS

## 2020-10-21 MED ORDER — CHLORHEXIDINE GLUCONATE 0.12 % MT SOLN: 15.0000 mL | Freq: Once | OROMUCOSAL | Status: AC

## 2020-10-21 MED ORDER — GLYCOPYRROLATE 0.2 MG/ML IJ SOLN
INTRAMUSCULAR | Status: DC | PRN
  Administered 2020-10-21: .2 mg via INTRAVENOUS

## 2020-10-21 MED ORDER — ORAL CARE MOUTH RINSE: 15.0000 mL | Freq: Once | OROMUCOSAL | Status: AC

## 2020-10-21 MED ORDER — VANCOMYCIN HCL IN DEXTROSE 1-5 GM/200ML-% IV SOLN
INTRAVENOUS | Status: AC
  Administered 2020-10-21: 1000 mg via INTRAVENOUS
  Filled 2020-10-21: qty 200

## 2020-10-21 MED ORDER — ALUM & MAG HYDROXIDE-SIMETH 200-200-20 MG/5ML PO SUSP: 15.0000 mL | ORAL | Status: DC | PRN

## 2020-10-21 MED ORDER — HYDROMORPHONE HCL 1 MG/ML IJ SOLN: 0.5000 mg | INTRAMUSCULAR | Status: DC | PRN

## 2020-10-21 MED ORDER — HEPARIN SODIUM (PORCINE) 1000 UNIT/ML IJ SOLN
INTRAMUSCULAR | Status: DC | PRN
  Administered 2020-10-21: 8000 [IU] via INTRAVENOUS

## 2020-10-21 MED ORDER — SODIUM CHLORIDE 0.9 % IV SOLN
500.0000 mL | Freq: Once | INTRAVENOUS | Status: AC | PRN
  Administered 2020-10-21: 500 mL via INTRAVENOUS

## 2020-10-21 MED ORDER — SENNOSIDES-DOCUSATE SODIUM 8.6-50 MG PO TABS: 1.0000 | ORAL_TABLET | Freq: Every evening | ORAL | Status: DC | PRN

## 2020-10-21 MED ORDER — BISACODYL 5 MG PO TBEC: 5.0000 mg | DELAYED_RELEASE_TABLET | Freq: Every day | ORAL | Status: DC | PRN

## 2020-10-21 MED ORDER — GUAIFENESIN-DM 100-10 MG/5ML PO SYRP: 15.0000 mL | ORAL_SOLUTION | ORAL | Status: DC | PRN

## 2020-10-21 MED ORDER — SCOPOLAMINE 1 MG/3DAYS TD PT72
MEDICATED_PATCH | TRANSDERMAL | Status: AC
  Filled 2020-10-21: qty 1

## 2020-10-21 MED ORDER — VANCOMYCIN HCL IN DEXTROSE 1-5 GM/200ML-% IV SOLN
1000.0000 mg | Freq: Two times a day (BID) | INTRAVENOUS | Status: AC
  Administered 2020-10-21 – 2020-10-22 (×2): 1000 mg via INTRAVENOUS
  Filled 2020-10-21 (×3): qty 200

## 2020-10-21 MED ORDER — SUGAMMADEX SODIUM 200 MG/2ML IV SOLN
INTRAVENOUS | Status: DC | PRN
  Administered 2020-10-21: 200 mg via INTRAVENOUS

## 2020-10-21 MED ORDER — ACETAMINOPHEN 650 MG RE SUPP: 325.0000 mg | RECTAL | Status: DC | PRN

## 2020-10-21 MED ORDER — PHENOL 1.4 % MT LIQD: 1.0000 | OROMUCOSAL | Status: DC | PRN

## 2020-10-21 MED ORDER — ONDANSETRON HCL 4 MG/2ML IJ SOLN
INTRAMUSCULAR | Status: AC
  Filled 2020-10-21: qty 2

## 2020-10-21 MED ORDER — PROTAMINE SULFATE 10 MG/ML IV SOLN
INTRAVENOUS | Status: DC | PRN
  Administered 2020-10-21: 50 mg via INTRAVENOUS

## 2020-10-21 MED ORDER — PHENYLEPHRINE HCL-NACL 10-0.9 MG/250ML-% IV SOLN
INTRAVENOUS | Status: DC | PRN
  Administered 2020-10-21: 40 ug/min via INTRAVENOUS

## 2020-10-21 MED ORDER — OXYCODONE HCL 5 MG/5ML PO SOLN: 5.0000 mg | Freq: Once | ORAL | Status: DC | PRN

## 2020-10-21 MED ORDER — ROCURONIUM BROMIDE 10 MG/ML (PF) SYRINGE
PREFILLED_SYRINGE | INTRAVENOUS | Status: AC
  Filled 2020-10-21: qty 10

## 2020-10-21 MED ORDER — HEPARIN SODIUM (PORCINE) 5000 UNIT/ML IJ SOLN
5000.0000 [IU] | Freq: Three times a day (TID) | INTRAMUSCULAR | Status: DC
  Administered 2020-10-22: 5000 [IU] via SUBCUTANEOUS
  Filled 2020-10-21: qty 1

## 2020-10-21 MED ORDER — LORATADINE 10 MG PO TABS
10.0000 mg | ORAL_TABLET | Freq: Every day | ORAL | Status: DC
  Administered 2020-10-22: 10 mg via ORAL
  Filled 2020-10-21: qty 1

## 2020-10-21 MED ORDER — DIPHENHYDRAMINE HCL 25 MG PO CAPS: 25.0000 mg | ORAL_CAPSULE | Freq: Every evening | ORAL | Status: DC | PRN

## 2020-10-21 MED ORDER — PROMETHAZINE HCL 25 MG/ML IJ SOLN: 6.2500 mg | INTRAMUSCULAR | Status: DC | PRN

## 2020-10-21 MED ORDER — HEMOSTATIC AGENTS (NO CHARGE) OPTIME
TOPICAL | Status: DC | PRN
  Administered 2020-10-21: 1 via TOPICAL

## 2020-10-21 MED ORDER — LIDOCAINE HCL (PF) 1 % IJ SOLN
INTRAMUSCULAR | Status: AC
  Filled 2020-10-21: qty 30

## 2020-10-21 MED ORDER — DEXAMETHASONE SODIUM PHOSPHATE 10 MG/ML IJ SOLN
INTRAMUSCULAR | Status: DC | PRN
  Administered 2020-10-21: 5 mg via INTRAVENOUS

## 2020-10-21 SURGICAL SUPPLY — 52 items
ADH SKN CLS APL DERMABOND .7 (GAUZE/BANDAGES/DRESSINGS) ×2
ADPR TBG 2 MALE LL ART (MISCELLANEOUS)
BAG COUNTER SPONGE SURGICOUNT (BAG) ×3 IMPLANT
BAG SPNG CNTER NS LX DISP (BAG) ×2
CANISTER SUCT 3000ML PPV (MISCELLANEOUS) ×3 IMPLANT
CATH ROBINSON RED A/P 18FR (CATHETERS) ×3 IMPLANT
CLIP VESOCCLUDE MED 24/CT (CLIP) ×3 IMPLANT
CLIP VESOCCLUDE SM WIDE 24/CT (CLIP) ×3 IMPLANT
COVER PROBE W GEL 5X96 (DRAPES) ×3 IMPLANT
DERMABOND ADVANCED (GAUZE/BANDAGES/DRESSINGS) ×1
DERMABOND ADVANCED .7 DNX12 (GAUZE/BANDAGES/DRESSINGS) ×2 IMPLANT
DRAIN CHANNEL 15F RND FF W/TCR (WOUND CARE) IMPLANT
ELECT REM PT RETURN 9FT ADLT (ELECTROSURGICAL) ×3
ELECTRODE REM PT RTRN 9FT ADLT (ELECTROSURGICAL) ×2 IMPLANT
EVACUATOR SILICONE 100CC (DRAIN) IMPLANT
GLOVE SRG 8 PF TXTR STRL LF DI (GLOVE) ×2 IMPLANT
GLOVE SURG ENC MOIS LTX SZ7.5 (GLOVE) ×3 IMPLANT
GLOVE SURG UNDER POLY LF SZ8 (GLOVE) ×3
GOWN STRL REUS W/ TWL LRG LVL3 (GOWN DISPOSABLE) ×4 IMPLANT
GOWN STRL REUS W/ TWL XL LVL3 (GOWN DISPOSABLE) ×4 IMPLANT
GOWN STRL REUS W/TWL LRG LVL3 (GOWN DISPOSABLE) ×6
GOWN STRL REUS W/TWL XL LVL3 (GOWN DISPOSABLE) ×6
HEMOSTAT SNOW SURGICEL 2X4 (HEMOSTASIS) ×3 IMPLANT
IV ADAPTER SYR DOUBLE MALE LL (MISCELLANEOUS) IMPLANT
KIT BASIN OR (CUSTOM PROCEDURE TRAY) ×3 IMPLANT
KIT SHUNT ARGYLE CAROTID ART 6 (VASCULAR PRODUCTS) ×2 IMPLANT
KIT TURNOVER KIT B (KITS) ×3 IMPLANT
LOOP VESSEL MINI RED (MISCELLANEOUS) IMPLANT
NEEDLE HYPO 25GX1X1/2 BEV (NEEDLE) ×3 IMPLANT
NEEDLE SPNL 20GX3.5 QUINCKE YW (NEEDLE) IMPLANT
NS IRRIG 1000ML POUR BTL (IV SOLUTION) ×9 IMPLANT
PACK CAROTID (CUSTOM PROCEDURE TRAY) ×3 IMPLANT
PAD ARMBOARD 7.5X6 YLW CONV (MISCELLANEOUS) ×6 IMPLANT
PATCH VASC XENOSURE 1CMX6CM (Vascular Products) ×3 IMPLANT
PATCH VASC XENOSURE 1X6 (Vascular Products) ×2 IMPLANT
POSITIONER HEAD DONUT 9IN (MISCELLANEOUS) ×3 IMPLANT
SHUNT CAROTID BYPASS 10 (VASCULAR PRODUCTS) IMPLANT
SHUNT CAROTID BYPASS 12FRX15.5 (VASCULAR PRODUCTS) IMPLANT
SPONGE SURGIFOAM ABS GEL 100 (HEMOSTASIS) IMPLANT
STOPCOCK 4 WAY LG BORE MALE ST (IV SETS) IMPLANT
SUT ETHILON 3 0 PS 1 (SUTURE) IMPLANT
SUT MNCRL AB 4-0 PS2 18 (SUTURE) ×3 IMPLANT
SUT PROLENE 5 0 C 1 24 (SUTURE) ×3 IMPLANT
SUT PROLENE 6 0 BV (SUTURE) ×9 IMPLANT
SUT SILK 3 0 (SUTURE)
SUT SILK 3-0 18XBRD TIE 12 (SUTURE) IMPLANT
SUT VIC AB 3-0 SH 27 (SUTURE) ×3
SUT VIC AB 3-0 SH 27X BRD (SUTURE) ×2 IMPLANT
SYR CONTROL 10ML LL (SYRINGE) ×2 IMPLANT
TOWEL GREEN STERILE (TOWEL DISPOSABLE) ×3 IMPLANT
TUBING ART PRESS 48 MALE/FEM (TUBING) IMPLANT
WATER STERILE IRR 1000ML POUR (IV SOLUTION) ×3 IMPLANT

## 2020-10-21 NOTE — H&P (Signed)
History and Physical Interval Note:  10/21/2020 9:41 AM  [name]  has presented today for surgery, with the diagnosis of RIGHT CAROTID STENOSIS.  The various methods of treatment have been discussed with the patient and family. After consideration of risks, benefits and other options for treatment, the patient has consented to  Procedure(s): RIGHT CAROTID ENDARTERECTOMY (Right) as a surgical intervention.  The patient's history has been reviewed, patient examined, no change in status, stable for surgery.  I have reviewed the patient's chart and labs.  Questions were answered to the patient's satisfaction.    Right carotid endarterectomy.    Marty Heck  Patient name: Troy [name]         MRN: [id number]        DOB: Jan 23, 1956        Sex: male   REASON FOR CONSULT: Evaluate 90% right carotid stenosis   HPI: Troy [name] is a 65 y.o. male, with history of hypertension and prostate cancer who presents for evaluation of 90% right carotid stenosis.  Patient states he was getting an x-ray to look at his cervical fusion given he was having some back and neck pain.  Ultimately was noted to have some calcification and was sent for ultrasound study.  He had an ultrasound of his carotids on 09/05/2020 that showed a 70-99% right ICA stenosis.  He then was sent for CTA neck by his PCP and on 09/28/20 had evidence of a 90% proximal right ICA stenosis.  He denies any previous neck radiation.  He is on a statin and started an aspirin.  He denies any previous history of stroke or TIA.  He has had no neurologic events.       Past Medical History:  Diagnosis Date   Arthritis      back   Cancer [facility])      prostate cancer   Diverticula of colon      left side.   GERD (gastroesophageal reflux disease)      food related   Herniated disc      3 herniated discs in neck   History of kidney stones     Seasonal allergies             Past Surgical History:  Procedure Laterality Date    BIOPSY   07/27/2018    Procedure: BIOPSY;  Surgeon: Daneil Dolin, MD;  Location: [facility] SU[facility];  Service: Endoscopy;;   COLONOSCOPY   01/09/2008    Dr.Rourk- normal rectum, L sided diverticulum. colonic mucosa and terminal ileum mucosa appeared normal.   COLONOSCOPY   1996    colonic adenomas   COLONOSCOPY   2001    L sided diverticula   COLONOSCOPY N/A 02/12/2014    Dr. Gala Romney: diverticulosis   COLONOSCOPY N/A 07/27/2018    Procedure: COLONOSCOPY;  Surgeon: Daneil Dolin, MD;  Location: [facility];  Service: Endoscopy;  Laterality: N/A;  2:00pm   HERNIA REPAIR Left 2008   LITHOTRIPSY        x2   NECK SURGERY       ROBOT ASSISTED LAPAROSCOPIC RADICAL PROSTATECTOMY N/A 01/25/2013    Procedure: ROBOTIC ASSISTED LAPAROSCOPIC RADICAL PROSTATECTOMY LEVEL 1;  Surgeon: Molli Hazard, MD;  Location: [facility];  Service: Urology;  Laterality: N/A;           Family History  Problem Relation Age of Onset   Dementia Mother     CAD Father     Prostate  cancer Father     Hypertension Brother     Hyperlipidemia Brother     Prostate cancer Paternal Uncle     CAD Paternal Aunt     Kidney cancer Paternal Aunt     Cancer Paternal Aunt     Dementia Maternal Grandmother     Colon cancer Neg Hx     Colon polyps Neg Hx        SOCIAL HISTORY: Social History         Socioeconomic History   Marital status: Married      Spouse name: Yoshiaki Kreuser   Number of children: 2   Years of education: Not on file   Highest education level: Not on file  Occupational History   Occupation: retired      Comment: Research officer, trade union in [place]  Tobacco Use   Smoking status: Never   Smokeless tobacco: Never  Substance and Sexual Activity   Alcohol use: No   Drug use: No   Sexual activity: Not on file  Other Topics Concern   Not on file  Social History Narrative   Not on file    Social Determinants of Health    Financial Resource Strain: Not on file  Food Insecurity: Not on file   Transportation Needs: Not on file  Physical Activity: Not on file  Stress: Not on file  Social Connections: Not on file  Intimate Partner Violence: Not on file           Allergies  Allergen Reactions   Penicillins Other (See Comments)      Did it involve swelling of the face/tongue/throat, SOB, or low BP? No Did it involve sudden or severe rash/hives, skin peeling, or any reaction on the inside of your mouth or nose? No Did you need to seek medical attention at a hospital or doctor's office? No When did it last happen?      Over 10 years If all above answers are "NO", may proceed with cephalosporin use.       Numbness around mouth     Ciprofloxacin Rash   Darvocet [Propoxyphene N-Acetaminophen] Rash   Tylenol [Acetaminophen] Rash            Current Outpatient Medications  Medication Sig Dispense Refill   atorvastatin (LIPITOR) 20 MG tablet Take 20 mg by mouth daily.       cetirizine (ZYRTEC) 10 MG tablet Take 10 mg by mouth daily.       clonazePAM (KLONOPIN) 1 MG tablet Take 1 mg by mouth 2 (two) times daily.       hyoscyamine (LEVSIN SL) 0.125 MG SL tablet PLACE 1 TABLET UNDER THE TONGUE EVERY 8 (EIGHT) HOURS AS NEEDED. ABDOMINAL CRAMPING AND DIARRHEA. 30 tablet 2   ibuprofen (ADVIL,MOTRIN) 200 MG tablet Take 2 tablets (400 mg total) by mouth every 8 (eight) hours as needed for mild pain or moderate pain.       losartan (COZAAR) 25 MG tablet Take 25 mg by mouth daily.       Multiple Vitamin (MULTIVITAMIN WITH MINERALS) TABS tablet Take 1 tablet by mouth daily.       naproxen (NAPROSYN) 500 MG tablet Take 500 mg by mouth 2 (two) times daily.       Polyethyl Glycol-Propyl Glycol (SYSTANE OP) Place 1 drop into both eyes 2 (two) times daily as needed (dry eyes).       tadalafil (CIALIS) 5 MG tablet Take 5 mg by mouth as needed for erectile dysfunction.  Na Sulfate-K Sulfate-Mg Sulf 17.5-3.13-1.6 GM/177ML SOLN Take 1 kit by mouth as directed. 1 Bottle 0   saccharomyces  boulardii (FLORASTOR) 250 MG capsule Take 1 capsule (250 mg total) by mouth 2 (two) times daily. 60 capsule 0   traMADol (ULTRAM) 50 MG tablet Take 50 mg by mouth as needed.        No current facility-administered medications for this visit.      REVIEW OF SYSTEMS:  _0  denotes positive finding, _1  denotes negative finding Cardiac   Comments:  Chest pain or chest pressure:      Shortness of breath upon exertion:      Short of breath when lying flat:      Irregular heart rhythm:             Vascular      Pain in calf, thigh, or hip brought on by ambulation:      Pain in feet at night that wakes you up from your sleep:      Blood clot in your veins:      Leg swelling:             Pulmonary      Oxygen at home:      Productive cough:      Wheezing:             Neurologic      Sudden weakness in arms or legs:      Sudden numbness in arms or legs:      Sudden onset of difficulty speaking or slurred speech:      Temporary loss of vision in one eye:      Problems with dizziness:             Gastrointestinal      Blood in stool:      Vomited blood:             Genitourinary      Burning when urinating:      Blood in urine:             Psychiatric      Major depression:             Hematologic      Bleeding problems:      Problems with blood clotting too easily:             Skin      Rashes or ulcers:             Constitutional      Fever or chills:          PHYSICAL EXAM:     Vitals:    10/08/20 1114 10/08/20 1117  BP: 136/83 (!) 146/83  Pulse: 73 84  Resp: 16    Temp: 98.3 F (36.8 C)    TempSrc: Temporal    SpO2: 98%    Weight: 189 lb (85.7 kg)    Height: _2  (1.727 m)        GENERAL: The patient is a well-nourished male, in no acute distress. The vital signs are documented above. CARDIAC: There is a regular rate and rhythm. VASCULAR:  Some limited mobility with range of motion in the neck PULMONARY: No respiratory distress. ABDOMEN: Soft and  non-tender. MUSCULOSKELETAL: There are no major deformities or cyanosis. NEUROLOGIC: No focal weakness or paresthesias are detected. SKIN: There are no ulcers or rashes noted. PSYCHIATRIC: The patient has a normal affect.   DATA:    CTA  neck reviewed 09/28/20 and high-grade greater than 90% right ICA stenosis in the proximal ICA.  No significant left carotid stenosis.   Assessment/Plan:   65 year old male presents for evaluation of asymptomatic high grade right carotid stenosis greater than 80%.  This is an asymptomatic lesion given that it was found incidentally and he has no history of stroke or TIA.  Discussed that in the setting of asymptomatic disease recommend intervention for greater than 80% stenosis.  I have recommended right carotid endarterectomy.  We talked about this being for stroke risk reduction.  I talked about stroke risk of 1% as well as risk of cranial nerve injury, bleeding, infection, risk of anesthesia etc.  Will get him scheduled today,.     Marty Heck, MD [facility] Office: [phone]

## 2020-10-21 NOTE — Anesthesia Preprocedure Evaluation (Signed)
Anesthesia Evaluation  Patient identified by MRN, date of birth, ID band Patient awake    Reviewed: Allergy & Precautions, NPO status , Patient's Chart, lab work & pertinent test results  History of Anesthesia Complications (+) PONV  Airway Mallampati: II  TM Distance: >3 FB Neck ROM: Full    Dental  (+) Teeth Intact   Pulmonary neg pulmonary ROS,    Pulmonary exam normal        Cardiovascular hypertension, Pt. on medications  Rhythm:Regular Rate:Normal     Neuro/Psych Anxiety Carotid stenosis    GI/Hepatic Neg liver ROS, GERD  Medicated,  Endo/Other  negative endocrine ROS  Renal/GU negative Renal ROS  negative genitourinary   Musculoskeletal  (+) Arthritis , Osteoarthritis,    Abdominal (+)  Abdomen: soft. Bowel sounds: normal.  Peds  Hematology negative hematology ROS (+)   Anesthesia Other Findings   Reproductive/Obstetrics                             Anesthesia Physical Anesthesia Plan  ASA: 3  Anesthesia Plan: General   Post-op Pain Management:    Induction: Intravenous  PONV Risk Score and Plan: 3 and Ondansetron, Dexamethasone, Midazolam, Scopolamine patch - Pre-op and Treatment may vary due to age or medical condition  Airway Management Planned: Mask and Oral ETT  Additional Equipment: Arterial line  Intra-op Plan:   Post-operative Plan: Extubation in OR  Informed Consent: I have reviewed the patients History and Physical, chart, labs and discussed the procedure including the risks, benefits and alternatives for the proposed anesthesia with the patient or authorized representative who has indicated his/her understanding and acceptance.     Dental advisory given  Plan Discussed with: CRNA  Anesthesia Plan Comments: (Lab Results      Component                Value               Date                      WBC                      5.7                  10/17/2020                HGB                      15.8                10/17/2020                HCT                      44.9                10/17/2020                MCV                      89.3                10/17/2020                PLT  198                 10/17/2020           Lab Results      Component                Value               Date                      NA                       139                 10/17/2020                K                        4.1                 10/17/2020                CO2                      25                  10/17/2020                GLUCOSE                  91                  10/17/2020                BUN                      7 (L)               10/17/2020                CREATININE               1.02                10/17/2020                CALCIUM                  9.4                 10/17/2020                GFRNONAA                 >60                 10/17/2020                GFRAA                    >60                 10/14/2018          )        Anesthesia Quick Evaluation

## 2020-10-21 NOTE — Anesthesia Procedure Notes (Signed)
Procedure Name: Intubation Date/Time: 10/21/2020 10:17 AM Performed by: Inda Coke, CRNA Pre-anesthesia Checklist: Patient identified, Emergency Drugs available, Suction available and Patient being monitored Patient Re-evaluated:Patient Re-evaluated prior to induction Oxygen Delivery Method: Circle System Utilized Preoxygenation: Pre-oxygenation with 100% oxygen Induction Type: IV induction Ventilation: Mask ventilation without difficulty and Oral airway inserted - appropriate to patient size Laryngoscope Size: Mac and 4 Grade View: Grade II Tube type: Oral Tube size: 7.5 mm Number of attempts: 1 Airway Equipment and Method: Stylet and Oral airway Placement Confirmation: ETT inserted through vocal cords under direct vision, positive ETCO2 and breath sounds checked- equal and bilateral Secured at: 22 cm Tube secured with: Tape Dental Injury: Teeth and Oropharynx as per pre-operative assessment  Comments: Placed by Rexford Maus, SRNA

## 2020-10-21 NOTE — Op Note (Signed)
OPERATIVE NOTE  PROCEDURE:   1.  right carotid endarterectomy with bovine patch angioplasty 2.  right intraoperative carotid ultrasound  PRE-OPERATIVE DIAGNOSIS: right asymptomatic high grade carotid stenosis (>80%)  POST-OPERATIVE DIAGNOSIS: same as above   SURGEON: Marty Heck, MD  ASSISTANT(S): Paulo Fruit, PA  ANESTHESIA: general  ESTIMATED BLOOD LOSS: <50 mL  FINDING(S): High grade ulcerated right internal carotid artery plaque. 2.  Continuous doppler audible flow signatures are appropriate for each carotid artery after endarterectomy and patch angioplasty. 3.  No evidence of intimal flap visualized on transverse or longitudinal ultrasonography.   SPECIMEN(S):  Carotid plaque (sent to Pathology)  INDICATIONS:   Troy [name] is a 65 y.o. male who presents with right asymptomatic high grade carotid stenosis >80%.  I discussed with the patient the risks, benefits, and alternatives to carotid endarterectomy.  I discussed the procedural details of carotid endarterectomy with the patient.  The patient is aware that the risks of carotid endarterectomy include but are not limited to: bleeding, infection, stroke, myocardial infarction, death, cranial nerve injuries both temporary and permanent, neck hematoma, possible airway compromise, labile blood pressure post-operatively, cerebral hyperperfusion syndrome, and possible need for additional interventions in the future. The patient is aware of the risks and agrees to proceed forward with the procedure.  DESCRIPTION: After full informed written consent was obtained from the patient, the patient was brought back to the operating room and placed supine upon the operating table.  Prior to induction, the patient received IV antibiotics.  After obtaining adequate anesthesia, the patient was placed into semi-Fowler position with a shoulder roll in place and the patient's neck slightly hyperextended and rotated away from the  surgical site.  The patient was prepped in the standard fashion for a right carotid endarterectomy.  I made an incision anterior to the sternocleidomastoid muscle and dissected down through the subcutaneous tissue.  The platysma was opened with electrocautery.  I then used Bovie cautery and blunt dissection to dissect through the underlying platysma and to mobilize the anterior border of the sternocleidomastoid as well as the internal jugular vein laterally.  The facial vein was ligated with 3-0 silk and surgical clips and divided.  After identifying the carotid artery I used Metzenbaum scissors to bluntly dissect the common carotid artery and then controlled this with both a large vessel loop and a umbilical tape.  At this point in time the patient was given 100 units/kg of IV heparin and we checked an ACT to ensure it was greater than 250.  I then carried my dissection cephalad and mobilized the external carotid artery and superior thyroid artery and controlled each of these with a vessel loop.  I then dissected out the internal carotid artery well past the distal plaque.  The internal carotid artery was then controlled with a umbilical tape as well. I was careful to identify the vagus nerve between the internal jugular and common carotid and this was presereved.  I was also careful to identify and preserve the hypoglossal nerve and this was preserved.    Once our ACT was confirmed, I proceeded by clamping the internal carotid artery with a angled bulldog clamp first.  The proximal common carotid artery was controlled with a angled debakey clamp.  The external carotid was controlled with a vessel loop.  I subsequently opened the common carotid artery with an 11 blade scalpel in longitudinal fashion and extended the arteriotomy with Potts scissors onto the ICA past the distal plaque.  I then used a Garment/textile technologist and performed a endarterectomy starting in the common carotid artery.  The external carotid artery  was endarterectomized with an eversion technique and I was careful to feather the distal ICA plaque.  The specimen was passed off the field.  The endarterectomy site was then flushed with heparinized saline and I was careful to ensure there were no flaps in the endarterectomy site. At this point a 10 [place] shunt was brought to the field and then initially placed distally into the ICA after removing the clamp. The shunt was back bleed with good flow.  I then placed the proximal end of the shunt in the common carotid artery and controlled this with a Rummel tourniquet.  I then brought a bovine carotid patch on the field and this was sewn in place with a running anastomosis using a 6-0 Prolene distally and a 5-0 proximal.  The bovine patch was trimmed accordingly.  The shunt was removed just before completion of the patch.  The artery was flushed antegrade and retrograde prior to completion of the patch.  Once the patch was complete, I flushed up the external carotid artery first prior to releasing the internal carotid artery clamp.  An intraoperative duplex was performed that showed no evidence of any flaps.  Once I was happy with the intraoperative ultrasound the patient was given protamine for reversal.  I used surgicel snow to get hemostasis around the patch.  Ultimately the platysma was closed in running fashion with 3-0 Vicryl.  The skin was closed with a running 4-0 Monocryl.  Dermabond was applied with a dry sterile dressing.  The patient was awakened from anesthesia with no new neurological deficit and taken to PACU in stable condition.    COMPLICATIONS: None  CONDITION: Stable  Marty Heck, MD [facility] Office: [place]   10/21/2020, 12:24 PM

## 2020-10-21 NOTE — Anesthesia Postprocedure Evaluation (Signed)
Anesthesia Post Note  Patient: Troy [name]  Procedure(s) Performed: RIGHT CAROTID ENDARTERECTOMY (Right) PATCH ANGIOPLASTY USING XENOSURE BIOLOGIC PATCH (Right: Neck)     Patient location during evaluation: PACU Anesthesia Type: General Level of consciousness: awake and alert Pain management: pain level controlled Vital Signs Assessment: post-procedure vital signs reviewed and stable Respiratory status: spontaneous breathing, nonlabored ventilation, respiratory function stable and patient connected to nasal cannula oxygen Cardiovascular status: blood pressure returned to baseline and stable Postop Assessment: no apparent nausea or vomiting Anesthetic complications: no   No notable events documented.  Last Vitals:  Vitals:   10/21/20 1355 10/21/20 1402  BP: 91/61 (!) 92/51  Pulse:    Resp:    Temp:    SpO2:      Last Pain:  Vitals:   10/21/20 1353  TempSrc: Oral  PainSc:                  March Rummage Vernisha Bacote

## 2020-10-21 NOTE — Anesthesia Procedure Notes (Signed)
Arterial Line Insertion Start/End7/25/2022 8:55 AM, 10/21/2020 9:02 AM Performed by: Josephine Igo, CRNA, CRNA  Patient location: Pre-op. Preanesthetic checklist: patient identified, IV checked, surgical consent, monitors and equipment checked and pre-op evaluation Lidocaine 1% used for infiltration Left, radial was placed Catheter size: 20 G Hand hygiene performed  and maximum sterile barriers used   Attempts: 1 Procedure performed without using ultrasound guided technique. Following insertion, dressing applied and Biopatch. Post procedure assessment: normal  Patient tolerated the procedure well with no immediate complications.

## 2020-10-21 NOTE — Transfer of Care (Signed)
Immediate Anesthesia Transfer of Care Note  Patient: Troy [name]  Procedure(s) Performed: RIGHT CAROTID ENDARTERECTOMY (Right) PATCH ANGIOPLASTY USING XENOSURE BIOLOGIC PATCH (Right: Neck)  Patient Location: PACU  Anesthesia Type:General  Level of Consciousness: awake and alert   Airway & Oxygen Therapy: Patient Spontanous Breathing and Patient connected to face mask oxygen  Post-op Assessment: Report given to RN and Post -op Vital signs reviewed and stable  Post vital signs: Reviewed and stable  Last Vitals:  Vitals Value Taken Time  BP 120/79 10/21/20 1227  Temp    Pulse 97 10/21/20 1231  Resp 12 10/21/20 1231  SpO2 97 % 10/21/20 1231  Vitals shown include unvalidated device data.  Last Pain:  Vitals:   10/21/20 0819  TempSrc:   PainSc: 0-No pain         Complications: No notable events documented.

## 2020-10-21 NOTE — Discharge Instructions (Signed)
   [facility]  Discharge Instructions   Carotid Surgery  Please refer to the following instructions for your post-procedure care. Your surgeon or physician assistant will discuss any changes with you.  Activity  You are encouraged to walk as much as you can. You can slowly return to normal activities but must avoid strenuous activity and heavy lifting until your doctor tell you it's okay. Avoid activities such as vacuuming or swinging a golf club. You can drive after one week if you are comfortable and you are no longer taking prescription pain medications. It is normal to feel tired for serval weeks after your surgery. It is also normal to have difficulty with sleep habits, eating, and bowel movements after surgery. These will go away with time.  Bathing/Showering  Shower daily after you go home. Do not soak in a bathtub, hot tub, or swim until the incision heals completely.  Incision Care  Shower every day. Clean your incision with mild soap and water. Pat the area dry with a clean towel. You do not need a bandage unless otherwise instructed. Do not apply any ointments or creams to your incision. You may have skin glue on your incision. Do not peel it off. It will come off on its own in about one week. Your incision may feel thickened and raised for several weeks after your surgery. This is normal and the skin will soften over time.   For Men Only: It's okay to shave around the incision but do not shave the incision itself for 2 weeks. It is common to have numbness under your chin that could last for several months.  Diet  Resume your normal diet. There are no special food restrictions following this procedure. A low fat/low cholesterol diet is recommended for all patients with vascular disease. In order to heal from your surgery, it is CRITICAL to get adequate nutrition. Your body requires vitamins, minerals, and protein. Vegetables are the best source of  vitamins and minerals. Vegetables also provide the perfect balance of protein. Processed food has little nutritional value, so try to avoid this.  Medications  Resume taking all of your medications unless your doctor or physician assistant tells you not to. If your incision is causing pain, you may take over-the- counter pain relievers such as acetaminophen (Tylenol). If you were prescribed a stronger pain medication, please be aware these medications can cause nausea and constipation. Prevent nausea by taking the medication with a snack or meal. Avoid constipation by drinking plenty of fluids and eating foods with a high amount of fiber, such as fruits, vegetables, and grains.   Do not take Tylenol if you are taking prescription pain medications.  Follow Up  Our office will schedule a follow up appointment 2-3 weeks following discharge.  Please call us immediately for any of the following conditions  . Increased pain, redness, drainage (pus) from your incision site. . Fever of 101 degrees or higher. . If you should develop stroke (slurred speech, difficulty swallowing, weakness on one side of your body, loss of vision) you should call 911 and go to the nearest emergency room. .  Reduce your risk of vascular disease:  . Stop smoking. If you would like help call [facility] at 1-800-QUIT-NOW (1-800-784-8669) or [place] at 336-586-4000. . Manage your cholesterol . Maintain a desired weight . Control your diabetes . Keep your blood pressure down .  If you have any questions, please call the office at [phone]. 

## 2020-10-22 ENCOUNTER — Encounter (HOSPITAL_COMMUNITY): Payer: Self-pay | Admitting: Vascular Surgery

## 2020-10-22 LAB — CBC
HCT: 37.5 % — ABNORMAL LOW (ref 39.0–52.0)
Hemoglobin: 12.7 g/dL — ABNORMAL LOW (ref 13.0–17.0)
MCH: 31 pg (ref 26.0–34.0)
MCHC: 33.9 g/dL (ref 30.0–36.0)
MCV: 91.5 fL (ref 80.0–100.0)
Platelets: 159 10*3/uL (ref 150–400)
RBC: 4.1 MIL/uL — ABNORMAL LOW (ref 4.22–5.81)
RDW: 13 % (ref 11.5–15.5)
WBC: 10.4 10*3/uL (ref 4.0–10.5)
nRBC: 0 % (ref 0.0–0.2)

## 2020-10-22 LAB — BASIC METABOLIC PANEL
Anion gap: 7 (ref 5–15)
BUN: 12 mg/dL (ref 8–23)
CO2: 24 mmol/L (ref 22–32)
Calcium: 8.7 mg/dL — ABNORMAL LOW (ref 8.9–10.3)
Chloride: 106 mmol/L (ref 98–111)
Creatinine, Ser: 1.06 mg/dL (ref 0.61–1.24)
GFR, Estimated: >60 mL/min (ref >60)
Glucose, Bld: 147 mg/dL — ABNORMAL HIGH (ref 70–99)
Potassium: 4.5 mmol/L (ref 3.5–5.1)
Sodium: 137 mmol/L (ref 135–145)

## 2020-10-22 LAB — LIPID PANEL
Cholesterol: 105 mg/dL (ref 0–200)
HDL: 32 mg/dL — ABNORMAL LOW (ref >40)
LDL Cholesterol: 58 mg/dL (ref 0–99)
Total CHOL/HDL Ratio: 3.3 RATIO
Triglycerides: 77 mg/dL (ref <150)
VLDL: 15 mg/dL (ref 0–40)

## 2020-10-22 MED ORDER — OXYCODONE HCL 5 MG PO TABS: 5.0000 mg | ORAL_TABLET | Freq: Four times a day (QID) | ORAL | 0 refills | Status: DC | PRN

## 2020-10-22 NOTE — Progress Notes (Addendum)
  Progress Note    10/22/2020 7:33 AM 1 Day Post-Op  Subjective:  no complaints.  Denies L sided weakness, slurring speech, or changes in vision   Vitals:   10/22/20 0324 10/22/20 0400  BP: 102/68 99/69  Pulse: 75 62  Resp: 10 17  Temp: 98 F (36.7 C)   SpO2: 93% 93%   Physical Exam: Lungs:  non labored Incisions:  R neck with some edema but c/d/i Extremities:  moving all extremities well Abdomen:  soft Neurologic: A&O  CBC    Component Value Date/Time   WBC 10.4 10/22/2020 0155   RBC 4.10 (L) 10/22/2020 0155   HGB 12.7 (L) 10/22/2020 0155   HCT 37.5 (L) 10/22/2020 0155   PLT 159 10/22/2020 0155   MCV 91.5 10/22/2020 0155   MCH 31.0 10/22/2020 0155   MCHC 33.9 10/22/2020 0155   RDW 13.0 10/22/2020 0155   LYMPHSABS 2.2 10/14/2018 1126   MONOABS 0.5 10/14/2018 1126   EOSABS 0.2 10/14/2018 1126   BASOSABS 0.1 10/14/2018 1126    BMET    Component Value Date/Time   NA 137 10/22/2020 0155   K 4.5 10/22/2020 0155   CL 106 10/22/2020 0155   CO2 24 10/22/2020 0155   GLUCOSE 147 (H) 10/22/2020 0155   BUN 12 10/22/2020 0155   CREATININE 1.06 10/22/2020 0155   CALCIUM 8.7 (L) 10/22/2020 0155   GFRNONAA >60 10/22/2020 0155   GFRAA >60 10/14/2018 1126    INR    Component Value Date/Time   INR 1.2 10/17/2020 1042     Intake/Output Summary (Last 24 hours) at 10/22/2020 0733 Last data filed at 10/22/2020 0600 Gross per 24 hour  Intake 3103.59 ml  Output 600 ml  Net 2503.59 ml     Assessment/Plan:  65 y.o. male is s/p R CEA 1 Day Post-Op   Neuro exam remains at baseline R neck incision is unremarkable Ok for discharge home Office will arrange f/u in 2-3 weeks    Dagoberto Ligas, PA-C [facility] [phone] 10/22/2020 7:33 AM  I have seen and evaluated the patient. I agree with the PA note as documented above.  Postop day 1 status post right carotid endarterectomy for an asymptomatic high-grade stenosis.  Neck incision looks good  and he is at his neurologic baseline.  We will plan discharge today with follow-up in 2 to 3 weeks for incision check.  Discussed continue aspirin from my standpoint and is already on statin.  Marty Heck, MD [facility] Office: [phone]

## 2020-10-22 NOTE — Discharge Summary (Signed)
Discharge Summary     Troy [name] March 03, 1956 65 y.o. male  [id number]  Admission Date: 10/21/2020  Discharge Date: 10/22/20  Physician: Dr. Carlis Abbott  Admission Diagnosis: Carotid stenosis, asymptomatic, right [I65.21]  Discharge Day services:    See progress note 10/22/20  Hospital Course:  The patient was admitted to the hospital and taken to the operating room on 10/21/2020 and underwent right carotid endarterectomy for asymptomatic stenosis.  The pt tolerated the procedure well and was transported to the PACU in good condition.   By POD 1, the pt neuro status remained at baselin  The remainder of the hospital course consisted of increasing mobilization and increasing intake of solids without difficulty.   Recent Labs    10/22/20 0155  NA 137  K 4.5  CL 106  CO2 24  GLUCOSE 147*  BUN 12  CALCIUM 8.7*   Recent Labs    10/21/20 1545 10/22/20 0155  WBC 10.1 10.4  HGB 13.7 12.7*  HCT 38.4* 37.5*  PLT 166 159   No results for input(s): INR in the last 72 hours.     Discharge Diagnosis:  Carotid stenosis, asymptomatic, right [I65.21]  Secondary Diagnosis: Patient Active Problem List   Diagnosis Date Noted   Carotid stenosis, asymptomatic, right 10/21/2020   Carotid stenosis 10/08/2020   Night sweats 06/09/2018   Diarrhea    Hypotension due to hypovolemia    Acute colitis 05/19/2018   Sepsis due to undetermined organism ([place]) 05/19/2018   Syncope 05/18/2018   GERD (gastroesophageal reflux disease) 05/18/2018   Lactic acidosis 05/18/2018   Hypothermia 05/18/2018   Hypokalemia due to excessive gastrointestinal loss of potassium 05/18/2018   Diverticulosis of colon without hemorrhage    Past Medical History:  Diagnosis Date   Anxiety    Aortic stenosis 05/19/2018   Mild by echo 04/2018, mean grad 77mHg   Arthritis    back   Cancer ([facility]    prostate cancer   Complication of anesthesia    Diverticula of colon    left side.   GERD  (gastroesophageal reflux disease)    food related   Herniated disc    3 herniated discs in neck   History of kidney stones    Hypertension    Left bundle branch block    Noted since 2016 on EKG   PONV (postoperative nausea and vomiting)    Seasonal allergies     Allergies as of 10/22/2020       Reactions   Penicillins Other (See Comments)   Did it involve swelling of the face/tongue/throat, SOB, or low BP? No Did it involve sudden or severe rash/hives, skin peeling, or any reaction on the inside of your mouth or nose? No Did you need to seek medical attention at a hospital or doctor's office? No When did it last happen?      Over 10 years If all above answers are "NO", may proceed with cephalosporin use. Numbness around mouth   Ciprofloxacin Rash   Darvocet [propoxyphene N-acetaminophen] Rash   Tylenol [acetaminophen] Rash        Medication List     TAKE these medications    aspirin EC 81 MG tablet Take 81 mg by mouth daily. Swallow whole.   atorvastatin 40 MG tablet Commonly known as: LIPITOR Take 1 tablet (40 mg total) by mouth daily.   cetirizine 10 MG tablet Commonly known as: ZYRTEC Take 10 mg by mouth daily.   clonazePAM 1 MG tablet Commonly known as:  KLONOPIN Take 1 mg by mouth 2 (two) times daily as needed for anxiety.   esomeprazole 40 MG capsule Commonly known as: NEXIUM Take 40 mg by mouth daily.   losartan 25 MG tablet Commonly known as: COZAAR Take 25 mg by mouth daily.   multivitamin with minerals Tabs tablet Take 1 tablet by mouth 2 (two) times a week.   Na Sulfate-K Sulfate-Mg Sulf 17.5-3.13-1.6 GM/177ML Soln Take 1 kit by mouth as directed.   naproxen 500 MG tablet Commonly known as: NAPROSYN Take 500 mg by mouth 2 (two) times daily.   oxyCODONE 5 MG immediate release tablet Commonly known as: Oxy IR/ROXICODONE Take 1 tablet (5 mg total) by mouth every 6 (six) hours as needed for moderate pain.   SYSTANE OP Place 1 drop into both  eyes daily.   tadalafil 5 MG tablet Commonly known as: CIALIS Take 5 mg by mouth as needed for erectile dysfunction.       ASK your doctor about these medications    hyoscyamine 0.125 MG SL tablet Commonly known as: LEVSIN SL PLACE 1 TABLET UNDER THE TONGUE EVERY 8 (EIGHT) HOURS AS NEEDED. ABDOMINAL CRAMPING AND DIARRHEA.   ibuprofen 200 MG tablet Commonly known as: ADVIL Take 2 tablets (400 mg total) by mouth every 8 (eight) hours as needed for mild pain or moderate pain.   saccharomyces boulardii 250 MG capsule Commonly known as: FLORASTOR Take 1 capsule (250 mg total) by mouth 2 (two) times daily.         Discharge Instructions:   [facility] Discharge Instructions Carotid Endarterectomy (CEA)  Please refer to the following instructions for your post-procedure care. Your surgeon or physician assistant will discuss any changes with you.  Activity  You are encouraged to walk as much as you can. You can slowly return to normal activities but must avoid strenuous activity and heavy lifting until your doctor tell you it's OK. Avoid activities such as vacuuming or swinging a golf club. You can drive after one week if you are comfortable and you are no longer taking prescription pain medications. It is normal to feel tired for serval weeks after your surgery. It is also normal to have difficulty with sleep habits, eating, and bowel movements after surgery. These will go away with time.  Bathing/Showering  You may shower after you come home. Do not soak in a bathtub, hot tub, or swim until the incision heals completely.  Incision Care  Shower every day. Clean your incision with mild soap and water. Pat the area dry with a clean towel. You do not need a bandage unless otherwise instructed. Do not apply any ointments or creams to your incision. You may have skin glue on your incision. Do not peel it off. It will come off on its own in about one  week. Your incision may feel thickened and raised for several weeks after your surgery. This is normal and the skin will soften over time. For Men Only: It's OK to shave around the incision but do not shave the incision itself for 2 weeks. It is common to have numbness under your chin that could last for several months.  Diet  Resume your normal diet. There are no special food restrictions following this procedure. A low fat/low cholesterol diet is recommended for all patients with vascular disease. In order to heal from your surgery, it is CRITICAL to get adequate nutrition. Your body requires vitamins, minerals, and protein. Vegetables are the   best source of vitamins and minerals. Vegetables also provide the perfect balance of protein. Processed food has little nutritional value, so try to avoid this.  Medications  Resume taking all of your medications unless your doctor or physician assistant tells you not to.  If your incision is causing pain, you may take over-the- counter pain relievers such as acetaminophen (Tylenol). If you were prescribed a stronger pain medication, please be aware these medications can cause nausea and constipation.  Prevent nausea by taking the medication with a snack or meal. Avoid constipation by drinking plenty of fluids and eating foods with a high amount of fiber, such as fruits, vegetables, and grains. Do not take Tylenol if you are taking prescription pain medications.  Follow Up  Our office will schedule a follow up appointment 2-3 weeks following discharge.  Please call us immediately for any of the following conditions  Increased pain, redness, drainage (pus) from your incision site. Fever of 101 degrees or higher. If you should develop stroke (slurred speech, difficulty swallowing, weakness on one side of your body, loss of vision) you should call 911 and go to the nearest emergency room.  Reduce your risk of vascular disease:  Stop smoking. If you would  like help call [facility] at 1-800-QUIT-NOW (1-800-784-8669) or [place] at 336-586-4000. Manage your cholesterol Maintain a desired weight Control your diabetes Keep your blood pressure down  If you have any questions, please call the office at [phone].  Disposition: home  Patient's condition: is Good  Follow up: 1. Dr. Clark in 2 weeks.   Matthew Eveland, PA-C [facility] [phone]   --- For VQI Registry use ---   Modified Rankin score at D/C (0-6): 0  IV medication needed for:  1. Hypertension: No 2. Hypotension: No  Post-op Complications: No  1. Post-op CVA or TIA: No  If yes: Event classification (right eye, left eye, right cortical, left cortical, verterobasilar, other):   If yes: Timing of event (intra-op, <6 hrs post-op, >=6 hrs post-op, unknown):   2. CN injury: No  If yes: CN  injuried   3. Myocardial infarction: No  If yes: Dx by (EKG or clinical, Troponin):   4.  CHF: No  5.  Dysrhythmia (new): No  6. Wound infection: No  7. Reperfusion symptoms: No  8. Return to OR: No  If yes: return to OR for (bleeding, neurologic, other CEA incision, other):   Discharge medications: Statin use:  Yes ASA use:  Yes   Beta blocker use:  No ACE-Inhibitor use:  No  ARB use:  Yes CCB use: No P2Y12 Antagonist use: No, [ ] Plavix, [ ] Plasugrel, [ ] Ticlopinine, [ ] Ticagrelor, [ ] Other, [ ] No for medical reason, [ ] Non-compliant, [ ] Not-indicated Anti-coagulant use:  No, [ ] Warfarin, [ ] Rivaroxaban, [ ] Dabigatran,    

## 2020-11-12 ENCOUNTER — Other Ambulatory Visit: Payer: Self-pay

## 2020-11-12 ENCOUNTER — Ambulatory Visit (INDEPENDENT_AMBULATORY_CARE_PROVIDER_SITE_OTHER): Payer: 59 | Admitting: Physician Assistant

## 2020-11-12 VITALS — BP 132/86 | HR 79 | Temp 98.5°F | Ht 68.0 in | Wt 187.3 lb

## 2020-11-12 DIAGNOSIS — I6521 Occlusion and stenosis of right carotid artery: Secondary | ICD-10-CM

## 2020-11-12 NOTE — Progress Notes (Signed)
POST OPERATIVE OFFICE NOTE    CC:  F/u for surgery  HPI:  This is a 65 y.o. male who is s/p right CEA 10/21/2020 for right asymptomatic high grade carotid stenosis (>80%) Presents for incision check. Has numbness of right chin. Sensation of lumb in incision when swallowing. No dysphagia. Denies monocular blindness, ectremity numbness or weakness  Allergies  Allergen Reactions   Penicillins Other (See Comments)    Did it involve swelling of the face/tongue/throat, SOB, or low BP? No Did it involve sudden or severe rash/hives, skin peeling, or any reaction on the inside of your mouth or nose? No Did you need to seek medical attention at a hospital or doctor's office? No When did it last happen?      Over 10 years If all above answers are "NO", may proceed with cephalosporin use.    Numbness around mouth    Ciprofloxacin Rash   Darvocet [Propoxyphene N-Acetaminophen] Rash   Tylenol [Acetaminophen] Rash    Current Outpatient Medications  Medication Sig Dispense Refill   aspirin EC 81 MG tablet Take 81 mg by mouth daily. Swallow whole.     atorvastatin (LIPITOR) 40 MG tablet Take 1 tablet (40 mg total) by mouth daily. 30 tablet    cetirizine (ZYRTEC) 10 MG tablet Take 10 mg by mouth daily.     clonazePAM (KLONOPIN) 1 MG tablet Take 1 mg by mouth 2 (two) times daily as needed for anxiety.     esomeprazole (NEXIUM) 40 MG capsule Take 40 mg by mouth daily.     hyoscyamine (LEVSIN SL) 0.125 MG SL tablet PLACE 1 TABLET UNDER THE TONGUE EVERY 8 (EIGHT) HOURS AS NEEDED. ABDOMINAL CRAMPING AND DIARRHEA. (Patient taking differently: Take 0.125 mg by mouth every 4 (four) hours as needed for cramping.) 30 tablet 2   losartan (COZAAR) 25 MG tablet Take 25 mg by mouth daily.      Multiple Vitamin (MULTIVITAMIN WITH MINERALS) TABS tablet Take 1 tablet by mouth 2 (two) times a week.     Na Sulfate-K Sulfate-Mg Sulf 17.5-3.13-1.6 GM/177ML SOLN Take 1 kit by mouth as directed. 1 Bottle 0   naproxen  (NAPROSYN) 500 MG tablet Take 500 mg by mouth 2 (two) times daily.     oxyCODONE (OXY IR/ROXICODONE) 5 MG immediate release tablet Take 1 tablet (5 mg total) by mouth every 6 (six) hours as needed for moderate pain. 15 tablet 0   Polyethyl Glycol-Propyl Glycol (SYSTANE OP) Place 1 drop into both eyes daily.     tadalafil (CIALIS) 5 MG tablet Take 5 mg by mouth as needed for erectile dysfunction.     ibuprofen (ADVIL,MOTRIN) 200 MG tablet Take 2 tablets (400 mg total) by mouth every 8 (eight) hours as needed for mild pain or moderate pain. (Patient not taking: No sig reported)     saccharomyces boulardii (FLORASTOR) 250 MG capsule Take 1 capsule (250 mg total) by mouth 2 (two) times daily. (Patient not taking: No sig reported) 60 capsule 0   No current facility-administered medications for this visit.     ROS:  See HPI  BP 132/86 (BP Location: Right Arm, Patient Position: Sitting, Cuff Size: Normal)   Pulse 79   Temp 98.5 F (36.9 C) (Tympanic)   Ht '5\' 8"'  (1.727 m)   Wt 187 lb 4.8 oz (85 kg)   SpO2 97%   BMI 28.48 kg/m   Physical Exam:  General appearance: Awake, alert in no apparent distress Neurologic: Alert and oriented x4, tongue  midline, face symmetric, grip strength 5/5 bilaterally. Speech clear Cardiac: Heart rate and rhythm are regular Respirations: Nonlabored Incision: Well approximated without bleeding or hematoma.  Subcutaneous tissue soft to palpation  Assessment/Plan:  This is a 65 y.o. male who is s/p: right CEA. Neuro stable. Continue asa and statin. Encouraged good BP control and follow-up with PCP. Return to clinic in 9 months with carotid duplex. Call for questions or concerns. Reviewed s/sx of stroke/TIA and advised to call EMS should these occur.   Risa Grill, PA-C [facility] [phone]  Clinic MD:  Carlis Abbott

## 2020-11-13 ENCOUNTER — Other Ambulatory Visit: Payer: Self-pay

## 2020-11-13 DIAGNOSIS — I6521 Occlusion and stenosis of right carotid artery: Secondary | ICD-10-CM

## 2021-01-01 ENCOUNTER — Other Ambulatory Visit: Payer: Self-pay | Admitting: Family Medicine

## 2021-01-01 ENCOUNTER — Other Ambulatory Visit (HOSPITAL_COMMUNITY): Payer: Self-pay | Admitting: Family Medicine

## 2021-01-01 DIAGNOSIS — I714 Abdominal aortic aneurysm, without rupture, unspecified: Secondary | ICD-10-CM

## 2021-01-13 ENCOUNTER — Ambulatory Visit (HOSPITAL_COMMUNITY): Payer: 59

## 2021-01-15 ENCOUNTER — Ambulatory Visit (HOSPITAL_COMMUNITY)
Admission: RE | Admit: 2021-01-15 | Discharge: 2021-01-15 | Disposition: A | Discharge status: Home / Self Care | Payer: 59 | Source: Ambulatory Visit | Attending: Family Medicine | Admitting: Family Medicine

## 2021-01-15 ENCOUNTER — Other Ambulatory Visit: Payer: Self-pay

## 2021-01-15 ENCOUNTER — Other Ambulatory Visit (HOSPITAL_COMMUNITY): Payer: Self-pay | Admitting: Family Medicine

## 2021-01-15 DIAGNOSIS — I714 Abdominal aortic aneurysm, without rupture, unspecified: Secondary | ICD-10-CM

## 2021-06-16 ENCOUNTER — Other Ambulatory Visit (HOSPITAL_COMMUNITY)
Admission: RE | Admit: 2021-06-16 | Discharge: 2021-06-16 | Disposition: A | Discharge status: Home / Self Care | Payer: 59 | Source: Other Acute Inpatient Hospital | Attending: Family Medicine | Admitting: Family Medicine

## 2021-06-16 DIAGNOSIS — Z79891 Long term (current) use of opiate analgesic: Secondary | ICD-10-CM | POA: Diagnosis not present

## 2021-06-16 DIAGNOSIS — Z5181 Encounter for therapeutic drug level monitoring: Secondary | ICD-10-CM | POA: Diagnosis present

## 2021-06-16 LAB — RAPID URINE DRUG SCREEN, HOSP PERFORMED
Amphetamines: NOT DETECTED
Barbiturates: NOT DETECTED
Benzodiazepines: NOT DETECTED
Cocaine: NOT DETECTED
Opiates: NOT DETECTED
Tetrahydrocannabinol: NOT DETECTED

## 2021-08-04 ENCOUNTER — Other Ambulatory Visit: Payer: Self-pay

## 2021-08-04 DIAGNOSIS — I6521 Occlusion and stenosis of right carotid artery: Secondary | ICD-10-CM

## 2021-08-07 NOTE — Progress Notes (Signed)
Office Note     CC:  follow up Requesting Provider:  Lemmie Evens, MD  HPI: Troy [name] is a 66 y.o. ([birth year]) male who presents for routine follow up of carotid artery stenosis. s/p right CEA 10/21/2020 by Dr. Carlis Abbott for right asymptomatic high grade carotid stenosis (>80%).    Today he reports no amaurosis fugax or other visual changes, slurred speech, facial dropping, or unilateral upper or lower extremity weakness or numbness. He does still have some numbness along right neck incision line. He says he only really notices this if he touches the area or while shaving. He otherwise reports doing well. He is compliant with his Aspirin, Statin and antihypertensive medications. He does not have any claudication symptoms, rest pain or tissue loss.     The pt is on a statin for cholesterol management.  The pt is on a daily aspirin.   Other AC:  none The pt is on ARB for hypertension.   The pt is not diabetic.   Tobacco hx:  never  Past Medical History:  Diagnosis Date   Anxiety    Aortic stenosis 05/19/2018   Mild by echo 04/2018, mean grad 22mHg   Arthritis    back   Cancer ([facility]    prostate cancer   Complication of anesthesia    Diverticula of colon    left side.   GERD (gastroesophageal reflux disease)    food related   Herniated disc    3 herniated discs in neck   History of kidney stones    Hypertension    Left bundle branch block    Noted since 2016 on EKG   PONV (postoperative nausea and vomiting)    Seasonal allergies     Past Surgical History:  Procedure Laterality Date   BIOPSY  07/27/2018   Procedure: BIOPSY;  Surgeon: RDaneil Dolin MD;  [facility];  Service: Endoscopy;;   COLONOSCOPY  01/09/2008   Dr.Rourk- normal rectum, L sided diverticulum. colonic mucosa and terminal ileum mucosa appeared normal.   COLONOSCOPY  1996   colonic adenomas   COLONOSCOPY  2001   L sided diverticula   COLONOSCOPY N/A 02/12/2014   Dr. RGala Romney  diverticulosis   COLONOSCOPY N/A 07/27/2018   Procedure: COLONOSCOPY;  Surgeon: RDaneil Dolin MD;  [facility];  Service: Endoscopy;  Laterality: N/A;  2:00pm   ENDARTERECTOMY Right 10/21/2020   Procedure: RIGHT CAROTID ENDARTERECTOMY;  Surgeon: CMarty Heck MD;  [place]  Service: Vascular;  Laterality: Right;   HERNIA REPAIR Left 2008   LITHOTRIPSY     x2   NECK SURGERY     PATCH ANGIOPLASTY Right 10/21/2020   Procedure: PATCH ANGIOPLASTY USING XRueben BashBIOLOGIC PATCH;  Surgeon: CMarty Heck MD;  [place]  Service: Vascular;  Laterality: Right;   [place] 01/25/2013   Procedure: ROBOTIC ASSISTED LAPAROSCOPIC RADICAL PROSTATECTOMY LEVEL 1;  Surgeon: DMolli Hazard MD;  [facility];  Service: Urology;  Laterality: N/A;    Social History   Socioeconomic History   Marital status: Married    Spouse name: JBexton Haak  Number of children: 2   Years of education: Not on file   Highest education level: Not on file  Occupational History   Occupation: retired    Comment: EResearch officer[place]   Smoking status: Never    Passive exposure: Never   Smokeless tobacco: Never  Vaping  Use   Vaping Use: Never used  Substance and Sexual Activity   Alcohol use: No   Drug use: No   Sexual activity: Not on file  Other Topics Concern   Not on file  Social History Narrative   Not on file   Social Determinants of Health   Financial Resource Strain: Not on file  Food Insecurity: Not on file  Transportation Needs: Not on file  Physical Activity: Not on file  Stress: Not on file  Social Connections: Not on file  Intimate Partner Violence: Not on file    Family History  Problem Relation Age of Onset   Dementia Mother    CAD Father    Prostate cancer Father    Hypertension Brother    Hyperlipidemia Brother    Prostate cancer Paternal Uncle    CAD Paternal Aunt     Kidney cancer Paternal Aunt    Cancer Paternal Aunt    Dementia Maternal Grandmother    Colon cancer Neg Hx    Colon polyps Neg Hx     Current Outpatient Medications  Medication Sig Dispense Refill   aspirin EC 81 MG tablet Take 81 mg by mouth daily. Swallow whole.     atorvastatin (LIPITOR) 40 MG tablet Take 1 tablet (40 mg total) by mouth daily. 30 tablet    cetirizine (ZYRTEC) 10 MG tablet Take 10 mg by mouth daily.     clonazePAM (KLONOPIN) 1 MG tablet Take 1 mg by mouth 2 (two) times daily as needed for anxiety.     esomeprazole (NEXIUM) 40 MG capsule Take 40 mg by mouth daily.     hyoscyamine (LEVSIN SL) 0.125 MG SL tablet PLACE 1 TABLET UNDER THE TONGUE EVERY 8 (EIGHT) HOURS AS NEEDED. ABDOMINAL CRAMPING AND DIARRHEA. (Patient taking differently: Take 0.125 mg by mouth every 4 (four) hours as needed for cramping.) 30 tablet 2   losartan (COZAAR) 25 MG tablet Take 25 mg by mouth daily.      meloxicam (MOBIC) 7.5 MG tablet      Multiple Vitamin (MULTIVITAMIN WITH MINERALS) TABS tablet Take 1 tablet by mouth 2 (two) times a week.     Polyethyl Glycol-Propyl Glycol (SYSTANE OP) Place 1 drop into both eyes daily.     tadalafil (CIALIS) 5 MG tablet Take 5 mg by mouth as needed for erectile dysfunction.     No current facility-administered medications for this visit.    Allergies  Allergen Reactions   Penicillins Other (See Comments)    Did it involve swelling of the face/tongue/throat, SOB, or low BP? No Did it involve sudden or severe rash/hives, skin peeling, or any reaction on the inside of your mouth or nose? No Did you need to seek medical attention at a hospital or doctor's office? No When did it last happen?      Over 10 years If all above answers are "NO", may proceed with cephalosporin use.    Numbness around mouth    Ciprofloxacin Rash   Darvocet [Propoxyphene N-Acetaminophen] Rash   Tylenol [Acetaminophen] Rash     REVIEW OF SYSTEMS:  '[X]'$  denotes positive  finding, '[ ]'$  denotes negative finding Cardiac  Comments:  Chest pain or chest pressure:    Shortness of breath upon exertion:    Short of breath when lying flat:    Irregular heart rhythm:        Vascular    Pain in calf, thigh, or hip brought on by ambulation:    Pain in  feet at night that wakes you up from your sleep:     Blood clot in your veins:    Leg swelling:         Pulmonary    Oxygen at home:    Productive cough:     Wheezing:         Neurologic    Sudden weakness in arms or legs:     Sudden numbness in arms or legs:     Sudden onset of difficulty speaking or slurred speech:    Temporary loss of vision in one eye:     Problems with dizziness:         Gastrointestinal    Blood in stool:     Vomited blood:         Genitourinary    Burning when urinating:     Blood in urine:        Psychiatric    Major depression:         Hematologic    Bleeding problems:    Problems with blood clotting too easily:        Skin    Rashes or ulcers:        Constitutional    Fever or chills:      PHYSICAL EXAMINATION:  Vitals:   08/19/21 1410 08/19/21 1412  BP: (!) 142/80 (!) 147/78  Pulse: 65   Resp: 20   Temp: 98.3 F (36.8 C)   TempSrc: Temporal   SpO2: 96%   Weight: 187 lb 3.2 oz (84.9 kg)   Height: '5\' 8"'$  (1.727 m)     General:  WDWN in NAD; vital signs documented above Gait: Normal HENT: WNL, normocephalic Pulmonary: normal non-labored breathing , without wheezing Cardiac: regular HR, without  Murmurs without carotid bruit. Right neck incision is well healed Abdomen: non distended Vascular Exam/Pulses:2+ radial, 2+ popliteal, 2+ DP and PT pulses bilaterally Extremities: without ischemic changes, without Gangrene , without cellulitis; without open wounds;  Musculoskeletal: no muscle wasting or atrophy  Neurologic: A&O X 3;  No focal weakness or paresthesias are detected Psychiatric:  The pt has Normal affect.   Non-Invasive Vascular Imaging:   VAS US  Carotid Duplex: 08/19/21 Summary:  Right Carotid: Velocities in the right ICA are consistent with a 1-39% stenosis.   Left Carotid: Velocities in the left ICA are consistent with a 1-39% stenosis.   Vertebrals:  Bilateral vertebral arteries demonstrate antegrade flow.  Subclavians: Normal flow hemodynamics were seen in bilateral subclavian arteries.    ASSESSMENT/PLAN:: 66 y.o. male here for follow up for carotid artery stenosis. s/p right CEA 10/21/2020 by Dr. Carlis Abbott for right asymptomatic high grade carotid stenosis (>80%). He remains without any neurological symptoms attributable to carotid disease.  - Duplex today shows bilateral 1-39% stenosis. Normal flow in vertebral and subclavian arteries bilaterally - Continue Aspirin and statin - Patient will follow up in 1 year with carotid duplex   Karoline Caldwell, PA-C [facility] [phone]  Clinic MD:   Roxanne Mins

## 2021-08-19 ENCOUNTER — Ambulatory Visit (HOSPITAL_COMMUNITY)
Admission: RE | Admit: 2021-08-19 | Discharge: 2021-08-19 | Disposition: A | Discharge status: Home / Self Care | Payer: 59 | Source: Ambulatory Visit | Attending: Vascular Surgery | Admitting: Vascular Surgery

## 2021-08-19 ENCOUNTER — Ambulatory Visit: Payer: 59 | Admitting: Physician Assistant

## 2021-08-19 VITALS — BP 147/78 | HR 65 | Temp 98.3°F | Resp 20 | Ht 68.0 in | Wt 187.2 lb

## 2021-08-19 DIAGNOSIS — I6521 Occlusion and stenosis of right carotid artery: Secondary | ICD-10-CM | POA: Diagnosis present

## 2021-10-15 ENCOUNTER — Ambulatory Visit (HOSPITAL_COMMUNITY)
Admission: RE | Admit: 2021-10-15 | Discharge: 2021-10-15 | Disposition: A | Discharge status: Home / Self Care | Payer: 59 | Source: Ambulatory Visit | Attending: Nurse Practitioner | Admitting: Nurse Practitioner

## 2021-10-15 ENCOUNTER — Other Ambulatory Visit (HOSPITAL_COMMUNITY): Payer: Self-pay | Admitting: Nurse Practitioner

## 2021-10-15 DIAGNOSIS — M79645 Pain in left finger(s): Secondary | ICD-10-CM | POA: Insufficient documentation

## 2021-10-28 ENCOUNTER — Other Ambulatory Visit: Payer: Self-pay | Admitting: Nurse Practitioner

## 2021-10-29 NOTE — Telephone Encounter (Signed)
Refilled but needs office visit, any APP. Last seen by Randall Hiss.

## 2021-11-25 ENCOUNTER — Encounter: Payer: Self-pay | Admitting: Internal Medicine

## 2021-11-25 ENCOUNTER — Ambulatory Visit: Payer: Medicare HMO | Admitting: Internal Medicine

## 2021-11-25 VITALS — BP 155/82 | HR 75 | Temp 97.5°F | Ht 69.0 in | Wt 188.0 lb

## 2021-11-25 DIAGNOSIS — K219 Gastro-esophageal reflux disease without esophagitis: Secondary | ICD-10-CM

## 2021-11-25 NOTE — Progress Notes (Signed)
Primary Care Physician:  Lemmie Evens, MD Primary Gastroenterologist:  Dr.   Pre-Procedure History & Physical: HPI:  Troy [name] is a 66 y.o. male here for follow-up of GERD.  Diarrhea. Distant history of diarrheal illness,  mild colitis on CT.  Stool studies negative.  Symptoms resolved takes hyoscyamine may be 2 or 3 times a year for abdominal cramps and diarrhea otherwise has normal bowel function.  Takes yogurt snack daily and 1 teaspoon of Metamucil.  Has what he describes normal bowel function 1-2 stools daily no melena or rectal bleeding.  Weight up 16 pounds.  GERD well-controlled on Nexium.  No dysphagia.  Due for average rescreening colonoscopy 2030.  Had a carotid endarterectomy last time I saw him.  He is accompanied by his wife today.  Past Medical History:  Diagnosis Date   Anxiety    Aortic stenosis 05/19/2018   Mild by echo 04/2018, mean grad 38mHg   Arthritis    back   Cancer ([facility]    prostate cancer   Complication of anesthesia    Diverticula of colon    left side.   GERD (gastroesophageal reflux disease)    food related   Herniated disc    3 herniated discs in neck   History of kidney stones    Hypertension    Left bundle branch block    Noted since 2016 on EKG   PONV (postoperative nausea and vomiting)    Seasonal allergies     Past Surgical History:  Procedure Laterality Date   BIOPSY  07/27/2018   Procedure: BIOPSY;  Surgeon: RDaneil Dolin MD;  [facility];  Service: Endoscopy;;   COLONOSCOPY  01/09/2008   Dr.Cherity Blickenstaff- normal rectum, L sided diverticulum. colonic mucosa and terminal ileum mucosa appeared normal.   COLONOSCOPY  1996   colonic adenomas   COLONOSCOPY  2001   L sided diverticula   COLONOSCOPY N/A 02/12/2014   Dr. RGala Romney diverticulosis   COLONOSCOPY N/A 07/27/2018   Procedure: COLONOSCOPY;  Surgeon: RDaneil Dolin MD;  [facility];  Service: Endoscopy;  Laterality: N/A;  2:00pm   ENDARTERECTOMY Right  10/21/2020   Procedure: RIGHT CAROTID ENDARTERECTOMY;  Surgeon: CMarty Heck MD;  [place]  Service: Vascular;  Laterality: Right;   HERNIA REPAIR Left 2008   LITHOTRIPSY     x2   NECK SURGERY     PATCH ANGIOPLASTY Right 10/21/2020   Procedure: PATCH ANGIOPLASTY USING XRueben BashBIOLOGIC PATCH;  Surgeon: CMarty Heck MD;  [place]  Service: Vascular;  Laterality: Right;   [place] 01/25/2013   Procedure: ROBOTIC ASSISTED LAPAROSCOPIC RADICAL PROSTATECTOMY LEVEL 1;  Surgeon: DMolli Hazard MD;  [facility];  Service: Urology;  Laterality: N/A;    Prior to Admission medications   Medication Sig Start Date End Date Taking? Authorizing Provider  aspirin EC 81 MG tablet Take 81 mg by mouth daily. Swallow whole.   Yes [provider]  atorvastatin (LIPITOR) 40 MG tablet Take 1 tablet (40 mg total) by mouth daily. 10/22/20  Yes EDagoberto Ligas PA-C  cetirizine (ZYRTEC) 10 MG tablet Take 10 mg by mouth daily.   Yes [provider]  clonazePAM (KLONOPIN) 1 MG tablet Take 1 mg by mouth 2 (two) times daily as needed for anxiety. 09/29/20  Yes [provider]  esomeprazole (NEXIUM) 40 MG capsule Take 40 mg by mouth daily. 09/16/20  Yes [provider]  hyoscyamine (LEVSIN SL) 0.125 MG SL tablet PLACE 1 TABLET UNDER THE TONGUE EVERY 8 (EIGHT) HOURS AS NEEDED. ABDOMINAL CRAMPING AND DIARRHEA. 10/29/21  Yes Annitta Needs, NP  losartan (COZAAR) 25 MG tablet Take 25 mg by mouth daily.    Yes [provider]  meloxicam (MOBIC) 7.5 MG tablet  05/28/21  Yes [provider]  Multiple Vitamin (MULTIVITAMIN WITH MINERALS) TABS tablet Take 1 tablet by mouth 2 (two) times a week.   Yes [provider]  Polyethyl Glycol-Propyl Glycol (SYSTANE OP) Place 1 drop into both eyes daily.   Yes [provider]  Psyllium (METAMUCIL 4 IN 1 FIBER) 55.6 % POWD Take by mouth  daily.   Yes [provider]  tadalafil (CIALIS) 5 MG tablet Take 5 mg by mouth as needed for erectile dysfunction.   Yes [provider]    Allergies as of 11/25/2021 - Review Complete 11/25/2021  Allergen Reaction Noted   Penicillins Other (See Comments) 07/10/2011   Ciprofloxacin Rash 01/26/2013   Darvocet [propoxyphene n-acetaminophen] Rash 07/10/2011   Tylenol [acetaminophen] Rash 07/10/2011    Family History  Problem Relation Age of Onset   Dementia Mother    CAD Father    Prostate cancer Father    Hypertension Brother    Hyperlipidemia Brother    Prostate cancer Paternal Uncle    CAD Paternal Aunt    Kidney cancer Paternal Aunt    Cancer Paternal Aunt    Dementia Maternal Grandmother    Colon cancer Neg Hx    Colon polyps Neg Hx     Social History   Socioeconomic History   Marital status: Married    Spouse name: Azaiah Licciardi   Number of children: 2   Years of education: Not on file   Highest education level: Not on file  Occupational History   Occupation: retired    Comment: Research officer, trade union in [place]   Tobacco Use   Smoking status: Never    Passive exposure: Never   Smokeless tobacco: Never  Vaping Use   Vaping Use: Never used  Substance and Sexual Activity   Alcohol use: No   Drug use: No   Sexual activity: Yes  Other Topics Concern   Not on file  Social History Narrative   Not on file   Social Determinants of Health   Financial Resource Strain: Medium Risk (06/09/2018)   Overall Financial Resource Strain (CARDIA)    Difficulty of Paying Living Expenses: Somewhat hard  Food Insecurity: No Food Insecurity (06/09/2018)   Hunger Vital Sign    Worried About Running Out of Food in the Last Year: Never true    [place] in the Last Year: Never true  Transportation Needs: No Transportation Needs (06/09/2018)   PRAPARE - Hydrologist (Medical): No    Lack of Transportation (Non-Medical): No  Physical  Activity: Inactive (06/09/2018)   Exercise Vital Sign    Days of Exercise per Week: 0 days    Minutes of Exercise per Session: 0 min  Stress: No Stress Concern Present (06/09/2018)   [place]    Feeling of Stress : Only a little  Social Connections: Moderately Integrated (06/09/2018)   Social Connection and Isolation Panel [NHANES]    Frequency of Communication with Friends and Family: More than three times a week    Frequency of Social Gatherings with Friends and Family: More than three times a week  Attends Religious Services: 1 to 4 times per year    Active Member of Clubs or Organizations: No    Attends Archivist Meetings: Never    Marital Status: Married  Human resources officer Violence: Not At Risk (06/09/2018)   Humiliation, Afraid, Rape, and Kick questionnaire    Fear of Current or Ex-Partner: No    Emotionally Abused: No    Physically Abused: No    Sexually Abused: No    Review of Systems: See HPI, otherwise negative ROS  Physical Exam: BP (!) 155/82 (BP Location: Left Arm, Patient Position: Sitting, Cuff Size: Normal)   Pulse 75   Temp (!) 97.5 F (36.4 C) (Temporal)   Ht '5\' 9"'$  (1.753 m)   Wt 188 lb (85.3 kg)   SpO2 97%   BMI 27.76 kg/m  General:   Alert,  Well-developed, well-nourished, pleasant and cooperative in NAD Mouth:  No deformity or lesions. Neck:  Supple; no masses or thyromegaly. No significant cervical adenopathy. Lungs:  Clear throughout to auscultation.   No wheezes, crackles, or rhonchi. No acute distress. Heart:  Regular rate and rhythm; no murmurs, clicks, rubs,  or gallops. Abdomen: Non-distended, normal bowel sounds.  Soft and nontender without appreciable mass or hepatosplenomegaly.  Pulses:  Normal pulses noted. Extremities:  Without clubbing or edema.  Impression/Plan: 66 year old gentleman with longstanding GERD well-controlled on Nexium 40 mg daily.  Intermittent  postprandial diarrhea consistent with IBS at this time.  No alarm symptoms.  Doing well on a daily fiber and yogurt.  Recommendations:   Continue Nexium or esomeprazole 40 mg daily-best taken 30 minutes before breakfast  Continue Metamucil 1 teaspoon daily is good  Levsin or hyoscyamine sublingually before meals and at bedtime as needed  Plan for repeat colonoscopy for screening purposes in 2030  Office visit with [place] in 1 year and as needed   Notice: This dictation was prepared with Dragon dictation along with smaller phrase technology. Any transcriptional errors that result from this process are unintentional and may not be corrected upon review.

## 2021-11-25 NOTE — Patient Instructions (Signed)
Good to see you again today!  Continue Nexium or esomeprazole 40 mg daily-best taken 30 minutes before breakfast  Continue Metamucil 1 teaspoon daily is good  Levsin or hyoscyamine sublingually before meals and at bedtime as needed  Plan for repeat colonoscopy for screening purposes in 2030  Office visit with [place] in 1 year and as needed

## 2022-01-05 DIAGNOSIS — C61 Malignant neoplasm of prostate: Secondary | ICD-10-CM | POA: Diagnosis not present

## 2022-01-07 DIAGNOSIS — K219 Gastro-esophageal reflux disease without esophagitis: Secondary | ICD-10-CM | POA: Diagnosis not present

## 2022-01-07 DIAGNOSIS — F419 Anxiety disorder, unspecified: Secondary | ICD-10-CM | POA: Diagnosis not present

## 2022-01-07 DIAGNOSIS — Z8546 Personal history of malignant neoplasm of prostate: Secondary | ICD-10-CM | POA: Diagnosis not present

## 2022-01-07 DIAGNOSIS — M545 Low back pain, unspecified: Secondary | ICD-10-CM | POA: Diagnosis not present

## 2022-01-12 DIAGNOSIS — C61 Malignant neoplasm of prostate: Secondary | ICD-10-CM | POA: Diagnosis not present

## 2022-01-12 DIAGNOSIS — N393 Stress incontinence (female) (male): Secondary | ICD-10-CM | POA: Diagnosis not present

## 2022-01-12 DIAGNOSIS — N200 Calculus of kidney: Secondary | ICD-10-CM | POA: Diagnosis not present

## 2022-03-03 DIAGNOSIS — I739 Peripheral vascular disease, unspecified: Secondary | ICD-10-CM | POA: Diagnosis not present

## 2022-03-03 DIAGNOSIS — E785 Hyperlipidemia, unspecified: Secondary | ICD-10-CM | POA: Diagnosis not present

## 2022-03-03 DIAGNOSIS — G629 Polyneuropathy, unspecified: Secondary | ICD-10-CM | POA: Diagnosis not present

## 2022-03-03 DIAGNOSIS — F419 Anxiety disorder, unspecified: Secondary | ICD-10-CM | POA: Diagnosis not present

## 2022-03-03 DIAGNOSIS — N529 Male erectile dysfunction, unspecified: Secondary | ICD-10-CM | POA: Diagnosis not present

## 2022-03-03 DIAGNOSIS — Z8249 Family history of ischemic heart disease and other diseases of the circulatory system: Secondary | ICD-10-CM | POA: Diagnosis not present

## 2022-03-03 DIAGNOSIS — I10 Essential (primary) hypertension: Secondary | ICD-10-CM | POA: Diagnosis not present

## 2022-03-03 DIAGNOSIS — Z809 Family history of malignant neoplasm, unspecified: Secondary | ICD-10-CM | POA: Diagnosis not present

## 2022-03-03 DIAGNOSIS — M1990 Unspecified osteoarthritis, unspecified site: Secondary | ICD-10-CM | POA: Diagnosis not present

## 2022-03-03 DIAGNOSIS — Z008 Encounter for other general examination: Secondary | ICD-10-CM | POA: Diagnosis not present

## 2022-03-03 DIAGNOSIS — Z791 Long term (current) use of non-steroidal anti-inflammatories (NSAID): Secondary | ICD-10-CM | POA: Diagnosis not present

## 2022-04-08 DIAGNOSIS — F419 Anxiety disorder, unspecified: Secondary | ICD-10-CM | POA: Diagnosis not present

## 2022-04-08 DIAGNOSIS — I10 Essential (primary) hypertension: Secondary | ICD-10-CM | POA: Diagnosis not present

## 2022-04-08 DIAGNOSIS — C61 Malignant neoplasm of prostate: Secondary | ICD-10-CM | POA: Diagnosis not present

## 2022-04-08 DIAGNOSIS — E782 Mixed hyperlipidemia: Secondary | ICD-10-CM | POA: Diagnosis not present

## 2022-07-08 DIAGNOSIS — E782 Mixed hyperlipidemia: Secondary | ICD-10-CM | POA: Diagnosis not present

## 2022-07-08 DIAGNOSIS — M79642 Pain in left hand: Secondary | ICD-10-CM | POA: Diagnosis not present

## 2022-07-08 DIAGNOSIS — F419 Anxiety disorder, unspecified: Secondary | ICD-10-CM | POA: Diagnosis not present

## 2022-07-08 DIAGNOSIS — I10 Essential (primary) hypertension: Secondary | ICD-10-CM | POA: Diagnosis not present

## 2022-07-27 DIAGNOSIS — M79642 Pain in left hand: Secondary | ICD-10-CM | POA: Diagnosis not present

## 2022-07-27 DIAGNOSIS — M1812 Unilateral primary osteoarthritis of first carpometacarpal joint, left hand: Secondary | ICD-10-CM | POA: Diagnosis not present

## 2022-08-05 DIAGNOSIS — F419 Anxiety disorder, unspecified: Secondary | ICD-10-CM | POA: Diagnosis not present

## 2022-08-05 DIAGNOSIS — M79645 Pain in left finger(s): Secondary | ICD-10-CM | POA: Diagnosis not present

## 2022-08-13 ENCOUNTER — Other Ambulatory Visit: Payer: Self-pay | Admitting: *Deleted

## 2022-08-13 DIAGNOSIS — I6521 Occlusion and stenosis of right carotid artery: Secondary | ICD-10-CM

## 2022-08-25 ENCOUNTER — Ambulatory Visit: Payer: Medicare HMO | Admitting: Physician Assistant

## 2022-08-25 ENCOUNTER — Ambulatory Visit (HOSPITAL_COMMUNITY)
Admission: RE | Admit: 2022-08-25 | Discharge: 2022-08-25 | Disposition: A | Discharge status: Home / Self Care | Payer: Medicare HMO | Source: Ambulatory Visit | Attending: Vascular Surgery | Admitting: Vascular Surgery

## 2022-08-25 VITALS — BP 156/85 | HR 70 | Temp 97.8°F | Resp 16 | Ht 69.0 in | Wt 179.0 lb

## 2022-08-25 DIAGNOSIS — I6521 Occlusion and stenosis of right carotid artery: Secondary | ICD-10-CM | POA: Diagnosis not present

## 2022-08-25 DIAGNOSIS — Z9889 Other specified postprocedural states: Secondary | ICD-10-CM

## 2022-08-25 DIAGNOSIS — I6523 Occlusion and stenosis of bilateral carotid arteries: Secondary | ICD-10-CM | POA: Diagnosis not present

## 2022-08-25 NOTE — Progress Notes (Signed)
Office Note     CC:  follow up Requesting Provider:  Gareth Morgan, MD  HPI: Troy [name] is a 67 y.o. (06-Dec-1955) male who presents for follow up of carotid stenosis. He has history of right CEA on 10/21/20 by Dr. Chestine Spore for asymptomatic stenosis.   He overall is doing well. He denies any visual changes, slurred speech, facial drooping, unilateral upper or lower extremity weakness or numbness. He does not have any discomfort in his legs on ambulation or rest, no tissue loss. He is managed on Aspirin and statin.  The pt is on a statin for cholesterol management.  The pt is on a daily aspirin.   Other AC:  none The pt is on ARB for hypertension.   The pt is not diabetic.   Tobacco hx:  never  Past Medical History:  Diagnosis Date   Anxiety    Aortic stenosis 05/19/2018   Mild by echo 04/2018, mean grad   Arthritis    back   Cancer [facility])    prostate cancer   Complication of anesthesia    Diverticula of colon    left side.   GERD (gastroesophageal reflux disease)    food related   Herniated disc    3 herniated discs in neck   History of kidney stones    Hypertension    Left bundle branch block    Noted since 2016 on EKG   PONV (postoperative nausea and vomiting)    Seasonal allergies     Past Surgical History:  Procedure Laterality Date   BIOPSY  07/27/2018   Procedure: BIOPSY;  Surgeon: Corbin Ade, MD;  Location: [facility];  Service: Endoscopy;;   COLONOSCOPY  01/09/2008   Dr.Rourk- normal rectum, L sided diverticulum. colonic mucosa and terminal ileum mucosa appeared normal.   COLONOSCOPY  1996   colonic adenomas   COLONOSCOPY  2001   L sided diverticula   COLONOSCOPY N/A 02/12/2014   Dr. Jena Gauss: diverticulosis   COLONOSCOPY N/A 07/27/2018   Procedure: COLONOSCOPY;  Surgeon: Corbin Ade, MD;  Location: [facility];  Service: Endoscopy;  Laterality: N/A;  2:00pm   ENDARTERECTOMY Right 10/21/2020   Procedure: RIGHT CAROTID ENDARTERECTOMY;   Surgeon: Cephus Shelling, MD;  Location: [facility] OR;  Service: Vascular;  Laterality: Right;   HERNIA REPAIR Left 2008   LITHOTRIPSY     x2   NECK SURGERY     PATCH ANGIOPLASTY Right 10/21/2020   Procedure: PATCH ANGIOPLASTY USING Livia Snellen BIOLOGIC PATCH;  Surgeon: Cephus Shelling, MD;  Location: [facility] OR;  Service: Vascular;  Laterality: Right;   ROBOT ASSISTED LAPAROSCOPIC RADICAL PROSTATECTOMY N/A 01/25/2013   Procedure: ROBOTIC ASSISTED LAPAROSCOPIC RADICAL PROSTATECTOMY LEVEL 1;  Surgeon: Milford Cage, MD;  Location: [facility] ORS;  Service: Urology;  Laterality: N/A;    Social History   Socioeconomic History   Marital status: Married    Spouse name: Lavan Disanti   Number of children: 2   Years of education: Not on file   Highest education level: Not on file  Occupational History   Occupation: retired    Comment: Optician, 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[place]   Tobacco Use   Smoking status: Never    Passive exposure: Never   Smokeless tobacco: Never  Vaping Use   Vaping Use: Never used  Substance and Sexual Activity   Alcohol use: No   Drug use: No   Sexual activity: Yes  Other Topics Concern   Not on file  Social  History Narrative   Not on file   Social Determinants of Health   Financial Resource Strain: Medium Risk (06/09/2018)   Overall Financial Resource Strain (CARDIA)    Difficulty of Paying Living Expenses: Somewhat hard  Food Insecurity: No Food Insecurity (06/09/2018)   Hunger Vital Sign    Worried About Running Out of Food in the Last Year: Never true    Ran Out of Food in the Last Year: Never true  Transportation Needs: No Transportation Needs (06/09/2018)   PRAPARE - Administrator, Civil Service (Medical): No    Lack of Transportation (Non-Medical): No  Physical Activity: Inactive (06/09/2018)   Exercise Vital Sign    Days of Exercise per Week: 0 days    Minutes of Exercise per Session: 0 min  Stress: No Stress Concern Present (06/09/2018)   [employer] of Occupational Health - Occupational Stress Questionnaire    Feeling of Stress : Only a little  Social Connections: Moderately Integrated (06/09/2018)   Social Connection and Isolation Panel [NHANES]    Frequency of Communication with Friends and Family: More than three times a week    Frequency of Social Gatherings with Friends and Family: More than three times a week    Attends Religious Services: 1 to 4 times per year    Active Member of [employer] or Organizations: No    Attends Banker Meetings: Never    Marital Status: Married  Catering manager Violence: Not At Risk (06/09/2018)   Humiliation, Afraid, Rape, and Kick questionnaire    Fear of Current or Ex-Partner: No    Emotionally Abused: No    Physically Abused: No    Sexually Abused: No    Family History  Problem Relation Age of Onset   Dementia Mother    CAD Father    Prostate cancer Father    Hypertension Brother    Hyperlipidemia Brother    Prostate cancer Paternal Uncle    CAD Paternal Aunt    Kidney cancer Paternal Aunt    Cancer Paternal Aunt    Dementia Maternal Grandmother    Colon cancer Neg Hx    Colon polyps Neg Hx     Current Outpatient Medications  Medication Sig Dispense Refill   ascorbic acid (VITAMIN C) 500 MG tablet Take 500 mg by mouth daily. Takes 2x/week     aspirin EC 81 MG tablet Take 81 mg by mouth daily. Swallow whole.     atorvastatin (LIPITOR) 40 MG tablet Take 1 tablet (40 mg total) by mouth daily. 30 tablet    cetirizine (ZYRTEC) 10 MG tablet Take 10 mg by mouth daily.     clonazePAM (KLONOPIN) 1 MG tablet Take 1 mg by mouth 2 (two) times daily as needed for anxiety.     diclofenac (VOLTAREN) 75 MG EC tablet Take 75 mg by mouth 2 (two) times daily.     esomeprazole (NEXIUM) 40 MG capsule Take 40 mg by mouth daily.     hyoscyamine (LEVSIN SL) 0.125 MG SL tablet PLACE 1 TABLET UNDER THE TONGUE EVERY 8 (EIGHT) HOURS AS NEEDED. ABDOMINAL CRAMPING AND DIARRHEA. 30 tablet 2    losartan (COZAAR) 25 MG tablet Take 25 mg by mouth daily.      Multiple Vitamin (MULTIVITAMIN WITH MINERALS) TABS tablet Take 1 tablet by mouth 2 (two) times a week.     Polyethyl Glycol-Propyl Glycol (SYSTANE OP) Place 1 drop into both eyes daily.     Psyllium (METAMUCIL 4 IN  1 FIBER) 55.6 % POWD Take by mouth daily.     tadalafil (CIALIS) 5 MG tablet Take 5 mg by mouth as needed for erectile dysfunction.     meloxicam (MOBIC) 7.5 MG tablet      No current facility-administered medications for this visit.    Allergies  Allergen Reactions   Penicillins Other (See Comments)    Did it involve swelling of the face/tongue/throat, SOB, or low BP? No Did it involve sudden or severe rash/hives, skin peeling, or any reaction on the inside of your mouth or nose? No Did you need to seek medical attention at a hospital or doctor's office? No When did it last happen?      Over 10 years If all above answers are "NO", may proceed with cephalosporin use.    Numbness around mouth    Ciprofloxacin Rash   Darvocet [Propoxyphene N-Acetaminophen] Rash   Tylenol [Acetaminophen] Rash     REVIEW OF SYSTEMS:  [X]  denotes positive finding, [ ]  denotes negative finding Cardiac  Comments:  Chest pain or chest pressure:    Shortness of breath upon exertion:    Short of breath when lying flat:    Irregular heart rhythm:        Vascular    Pain in calf, thigh, or hip brought on by ambulation:    Pain in feet at night that wakes you up from your sleep:     Blood clot in your veins:    Leg swelling:         Pulmonary    Oxygen at home:    Productive cough:     Wheezing:         Neurologic    Sudden weakness in arms or legs:     Sudden numbness in arms or legs:     Sudden onset of difficulty speaking or slurred speech:    Temporary loss of vision in one eye:     Problems with dizziness:         Gastrointestinal    Blood in stool:     Vomited blood:         Genitourinary    Burning when  urinating:     Blood in urine:        Psychiatric    Major depression:         Hematologic    Bleeding problems:    Problems with blood clotting too easily:        Skin    Rashes or ulcers:        Constitutional    Fever or chills:      PHYSICAL EXAMINATION:  Vitals:   08/25/22 1325  BP: (!) 156/85  Pulse: 70  Resp: 16  Temp: 97.8 F (36.6 C)  TempSrc: Temporal  SpO2: 97%  Weight: 179 lb (81.2 kg)  Height: 5\' 9"  (1.753 m)    General:  WDWN in NAD; vital signs documented above Gait: Normal  HENT: WNL, normocephalic Pulmonary: normal non-labored breathing , without Rales, rhonchi,  wheezing Cardiac: regular HR Abdomen: soft, NT, no masses Vascular Exam/Pulses:2+ radial pulses bilaterally,  2+ DP and PT pulses bilaterally, feet warm and well perfused Extremities: without ischemic changes, without Gangrene , without cellulitis; without open wounds;  Musculoskeletal: no muscle wasting or atrophy  Neurologic: A&O X 3 Psychiatric:  The pt has Normal affect.   Non-Invasive Vascular Imaging:   VAS US Carotid Duplex: Summary:  Right Carotid: Patent CEA with no stenosis.  Left Carotid: Velocities in the left ICA are consistent with a 1-39% stenosis.   Vertebrals: Bilateral vertebral arteries demonstrate antegrade flow.  Subclavians: Normal flow hemodynamics were seen in bilateral subclavian arteries.    ASSESSMENT/PLAN:: 67 y.o. male here for follow up for carotid stenosis. He has history of right CEA on 10/21/20 by Dr. Chestine Spore for asymptomatic stenosis. He is without any associated neurological symptoms.  - Duplex today shows patent right CEA without stenosis. Left ICA with 1-39% stenosis. Normal flow in the vertebral and subclavian arteries bilaterally - Continue Aspirin and statin - He will follow up in 1 year with repeat carotid duplex   Graceann Congress, PA-C [facility] [phone]  Clinic MD:   [place]

## 2022-08-31 ENCOUNTER — Other Ambulatory Visit: Payer: Self-pay

## 2022-08-31 DIAGNOSIS — I6523 Occlusion and stenosis of bilateral carotid arteries: Secondary | ICD-10-CM

## 2022-09-07 DIAGNOSIS — M1812 Unilateral primary osteoarthritis of first carpometacarpal joint, left hand: Secondary | ICD-10-CM | POA: Diagnosis not present

## 2022-09-08 IMAGING — CT CT ANGIO NECK
2 of 7 series · 8 of 33 positions shown · IV contrast (omnipaque)
Comparison: None.

CLINICAL DATA: Right carotid occlusion

EXAM:
CT ANGIOGRAPHY NECK
TECHNIQUE: Multidetector CT imaging of the neck was performed using the
standard protocol during bolus administration of intravenous
contrast. Multiplanar CT image reconstructions and MIPs were
obtained to evaluate the vascular anatomy. Carotid stenosis
measurements (when applicable) are obtained utilizing NASCET
criteria, using the distal internal carotid diameter as the
denominator.
CONTRAST:  75mL OMNIPAQUE IOHEXOL 350 MG/ML SOLN

[Series 5: cta neck · axial · 0.54mm/px · z∈[+1624,+1696]mm · 2 of 109 slices shown]
[im 37/109  soft-tissue]
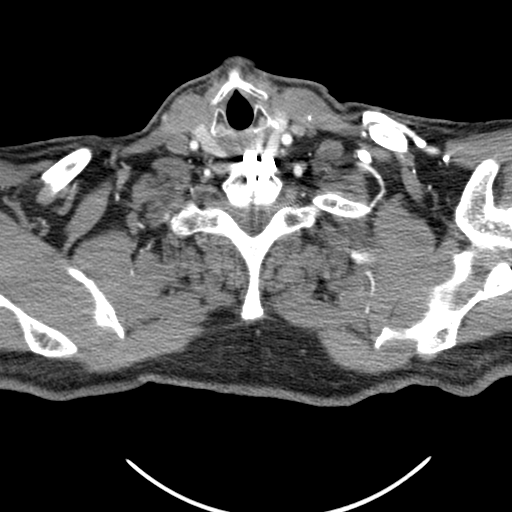
[im 73/109  soft-tissue]
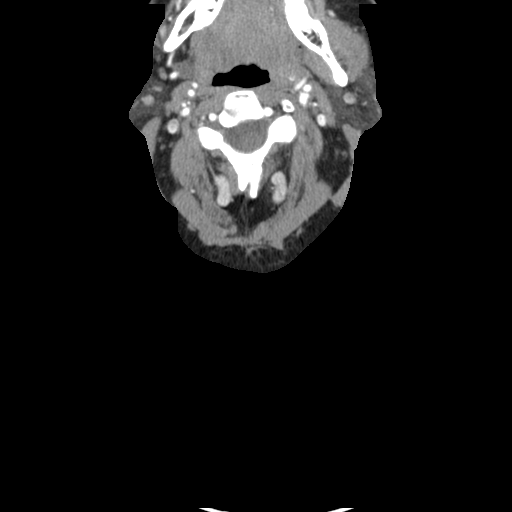

[Series 7: ax thin · axial · 0.35mm/px · z∈[+1583,+1736]mm · 6 of 215 slices shown]
[im 31/215  soft-tissue]
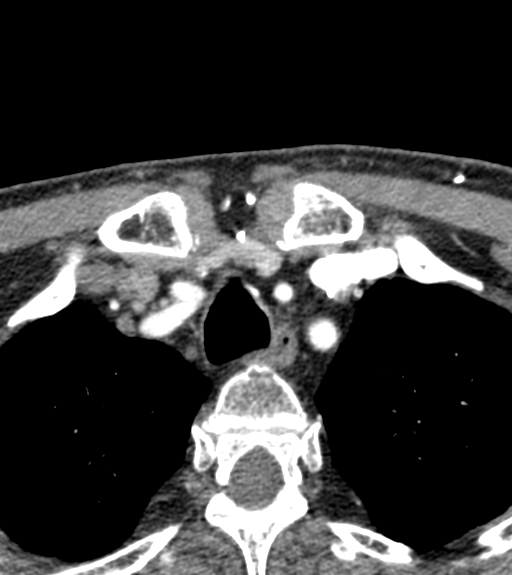
[im 62/215  bone]
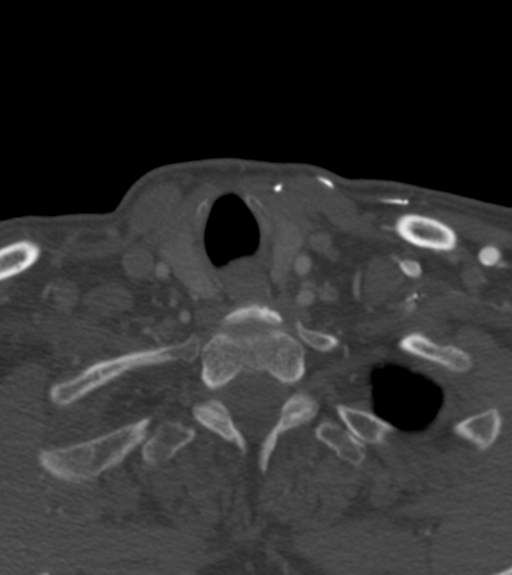
[im 92/215  soft-tissue]
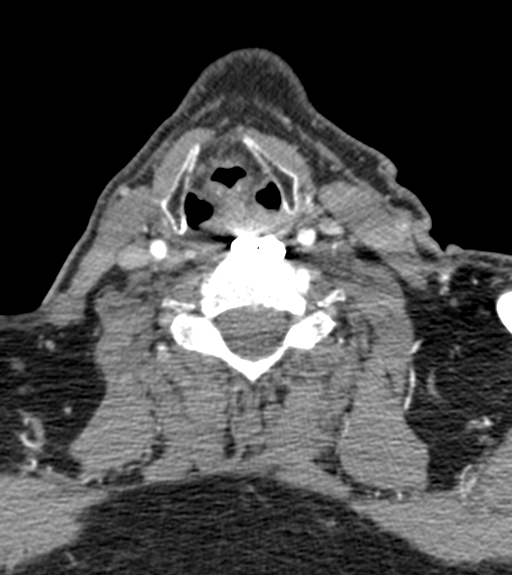
[im 123/215  bone]
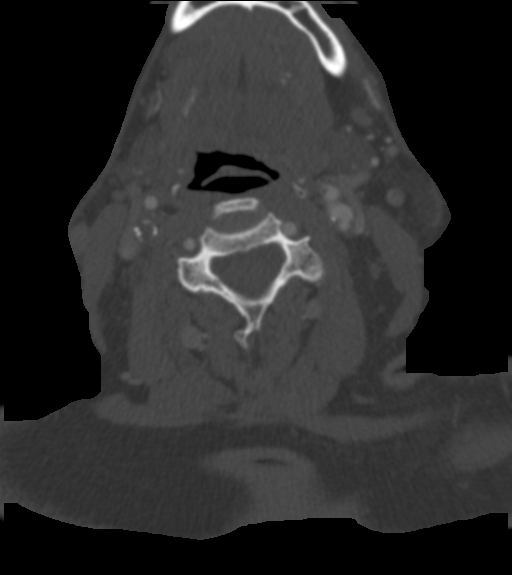
[im 153/215  soft-tissue]
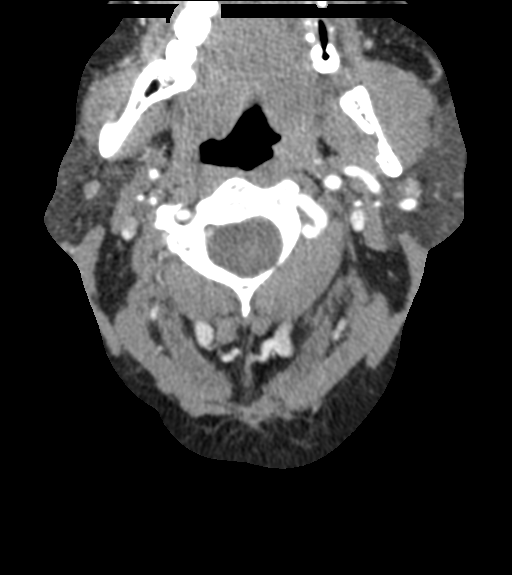
[im 184/215  bone]
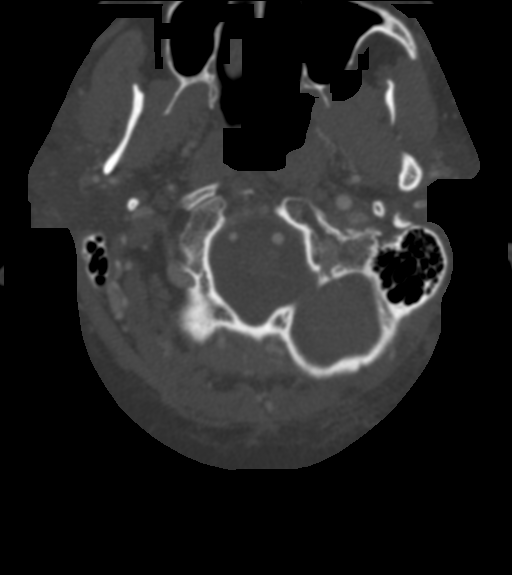

[8 of 33 positions shown; findings below may reference images not displayed]

FINDINGS: Skeleton: There is no bony spinal canal stenosis. No lytic or
blastic lesion.

Other neck: Normal pharynx, larynx and major salivary glands. No
cervical lymphadenopathy. Unremarkable thyroid gland.

Upper chest: No pneumothorax or pleural effusion. No nodules or
masses.

Aortic arch: There is no calcific atherosclerosis of the aortic
arch. There is no aneurysm, dissection or hemodynamically
significant stenosis of the visualized ascending aorta and aortic
arch. Conventional 3 vessel aortic branching pattern. The visualized
proximal subclavian arteries are widely patent.

Right carotid system:

--Common carotid artery: Widely patent origin without common carotid
artery dissection or aneurysm.

--Internal carotid artery: There is atherosclerotic plaque that
causes near complete occlusion of the proximal right carotid artery.
The more distal right ICA is patent. The stenosis measures
approximately 90% by NASCET criteria.

--External carotid artery: No acute abnormality.

Left carotid system:

--Common carotid artery: Widely patent origin without common carotid
artery dissection or aneurysm.

--Internal carotid artery:No dissection, occlusion or aneurysm. Mild
atherosclerotic calcification at the carotid bifurcation without
hemodynamically significant stenosis.

--External carotid artery: No acute abnormality.

Vertebral arteries: Left dominant configuration. Both origins are
normal. No dissection, occlusion or flow-limiting stenosis to the
vertebrobasilar confluence.

Review of the MIP images confirms the above findings
IMPRESSION: 1. 90% stenosis of the proximal right ICA secondary to
atherosclerotic plaque.
2. No hemodynamically significant stenosis of the left carotid or
vertebral arteries.

## 2022-09-29 NOTE — Progress Notes (Signed)
HPI: [name] is a 67 y.o. male, who is here today to establish care.  Former PCP: Dr. Sudie [name] Last preventive routine visit: 09/2021.  He has history of osteoarthritis, prostate cancer diagnosed in 2014, diverticulosis, acid reflux, seasonal allergies, HTN, and HLD.  Prostate cancer, s/p proctectomy, and nephrolithiasis. He follows with urology annually, Dr. Berneice [name]. Last PSA 0.0 in 12/2021.  On Tadalafil for ED from the urologist and takes Cetirizine for allergies.   Additionally, he has a history of cervical DDD and has undergone two surgeries for this condition in 2017 and 2018.  He sees Dr. Frazier [name] at [facility] for his left-hand joint pain, where he received a steroid injection in the thumb MCP joint last month, and has a follow-up appointment scheduled  in August/2024.  GERD on, Esomeprazole 40 mg daily. He takes Metamucil for constipation and hyoscyamine daily as needed for abdominal cramps. Follows with GI.  -Anxiety: Takes Clonazepam 0.5 mg bid.Problem is well controlled, he has not tried [employer]  He reports sleeping approximately eight hours per night. Denies hx of depression.  In 2020, he experienced an episode of sepsis, but no underlying issues were identified during the subsequent workup. No serious illness after hospital discharge.  HLD on Atorvastatin 40 mg daily. PAD, s/p right carotid endarterectomy in 09/2020. Follows with vascular annually.  He also takes Aspirin 81 mg daily. He denies any history of tobacco use.  Lab Results  Component Value Date   CHOL 105 10/22/2020   HDL 32 (L) 10/22/2020   LDLCALC 58 10/22/2020   TRIG 77 10/22/2020   CHOLHDL 3.3 10/22/2020   HTN: He monitors his blood pressure at home two or three times a week, with readings ranging from 128/83 to 143/85.  Losartan 25 mg daily. Negative severe/frequent headache, visual changes, chest pain, dyspnea, palpitation, focal weakness, or edema. Reports having a left bundle  branch block, which was evaluated by Dr. Wyline [name] in 2016 and during a hospitalization in 2020.  An echocardiogram performed in February 2020 showed a left ventricular ejection fraction (LVEF) of 60-65%.    Last eye exam 08/2021.  Lab Results  Component Value Date   CREATININE 1.06 10/22/2020   BUN 12 10/22/2020   NA 137 10/22/2020   K 4.5 10/22/2020   CL 106 10/22/2020   CO2 24 10/22/2020   Review of Systems  Constitutional:  Negative for chills and fever.  HENT:  Negative for mouth sores and sore throat.   Respiratory:  Negative for cough and wheezing.   Gastrointestinal:  Negative for abdominal pain, nausea and vomiting.  Endocrine: Negative for cold intolerance and heat intolerance.  Genitourinary:  Negative for decreased urine volume, dysuria and hematuria.  Musculoskeletal:  Positive for arthralgias and neck pain.  Skin:  Negative for rash.  Neurological:  Negative for syncope and facial asymmetry.  Psychiatric/Behavioral:  Negative for confusion and hallucinations.   See other pertinent positives and negatives in HPI.  Current Outpatient Medications on File Prior to Visit  Medication Sig Dispense Refill   ascorbic acid (VITAMIN C) 500 MG tablet Take 500 mg by mouth daily. Takes 2x/week     aspirin EC 81 MG tablet Take 81 mg by mouth daily. Swallow whole.     atorvastatin (LIPITOR) 40 MG tablet Take 1 tablet (40 mg total) by mouth daily. 30 tablet    cetirizine (ZYRTEC) 10 MG tablet Take 10 mg by mouth daily.     clonazePAM (KLONOPIN) 0.5 MG tablet Take 0.5 mg by  mouth 2 (two) times daily as needed for anxiety.     esomeprazole (NEXIUM) 40 MG capsule Take 40 mg by mouth daily.     hyoscyamine (LEVSIN SL) 0.125 MG SL tablet PLACE 1 TABLET UNDER THE TONGUE EVERY 8 (EIGHT) HOURS AS NEEDED. ABDOMINAL CRAMPING AND DIARRHEA. 30 tablet 2   losartan (COZAAR) 25 MG tablet Take 25 mg by mouth daily.      Polyethyl Glycol-Propyl Glycol (SYSTANE OP) Place 1 drop into both eyes daily.      Psyllium (METAMUCIL 4 IN 1 FIBER) 55.6 % POWD Take by mouth daily.     tadalafil (CIALIS) 5 MG tablet Take 5 mg by mouth as needed for erectile dysfunction.     No current facility-administered medications on file prior to visit.    Past Medical History:  Diagnosis Date   Allergy    Seasonal   Anxiety    Aortic stenosis 05/19/2018   Mild by echo 04/2018, mean grad   Arthritis    back   Cancer [facility])    prostate cancer   Complication of anesthesia    Diverticula of colon    left side.   GERD (gastroesophageal reflux disease)    food related   Herniated disc    3 herniated discs in neck   History of kidney stones    Hyperlipidemia 09/30/2022   Hypertension    Left bundle branch block    Noted since 2016 on EKG   PONV (postoperative nausea and vomiting)    Seasonal allergies    Allergies  Allergen Reactions   Penicillins Other (See Comments)    Did it involve swelling of the face/tongue/throat, SOB, or low BP? No Did it involve sudden or severe rash/hives, skin peeling, or any reaction on the inside of your mouth or nose? No Did you need to seek medical attention at a hospital or doctor's office? No When did it last happen?      Over 10 years If all above answers are "NO", may proceed with cephalosporin use.    Numbness around mouth    Ciprofloxacin Rash   Darvocet [Propoxyphene N-Acetaminophen] Rash   Tylenol [Acetaminophen] Rash    Family History  Problem Relation Age of Onset   Dementia Mother    CAD Father    Prostate cancer Father    Cancer Father    Heart disease Father    Hypertension Brother    Hyperlipidemia Brother    Diabetes Brother    Prostate cancer Paternal Uncle    CAD Paternal Aunt    Kidney cancer Paternal Aunt    Cancer Paternal Aunt    Dementia Maternal Grandmother    Colon cancer Neg Hx    Colon polyps Neg Hx     Social History   Socioeconomic History   Marital status: Married    Spouse name: Troy [name]   Number of  children: 2   Years of education: Not on file   Highest education level: Not on file  Occupational History   Occupation: retired    Comment: Optician, 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[place]   Tobacco Use   Smoking status: Never    Passive exposure: Never   Smokeless tobacco: Never  Vaping Use   Vaping Use: Never used  Substance and Sexual Activity   Alcohol use: No   Drug use: No   Sexual activity: Yes  Other Topics Concern   Not on file  Social History Narrative   Not on file   Social  Determinants of Health   Financial Resource Strain: Medium Risk (06/09/2018)   Overall Financial Resource Strain (CARDIA)    Difficulty of Paying Living Expenses: Somewhat hard  Food Insecurity: No Food Insecurity (06/09/2018)   Hunger Vital Sign    Worried About Running Out of Food in the Last Year: Never true    Ran Out of Food in the Last Year: Never true  Transportation Needs: No Transportation Needs (06/09/2018)   PRAPARE - Administrator, Civil Service (Medical): No    Lack of Transportation (Non-Medical): No  Physical Activity: Inactive (06/09/2018)   Exercise Vital Sign    Days of Exercise per Week: 0 days    Minutes of Exercise per Session: 0 min  Stress: No Stress Concern Present (06/09/2018)   [employer] of Occupational Health - Occupational Stress Questionnaire    Feeling of Stress : Only a little  Social Connections: Moderately Integrated (06/09/2018)   Social Connection and Isolation Panel [NHANES]    Frequency of Communication with Friends and Family: More than three times a week    Frequency of Social Gatherings with Friends and Family: More than three times a week    Attends Religious Services: 1 to 4 times per year    Active Member of Clubs or Organizations: No    Attends Banker Meetings: Never    Marital Status: Married   Vitals:   09/30/22 1041  BP: 124/80  Pulse: 64  Resp: 16  Temp: 98.5 F (36.9 C)  SpO2: 98%   Body mass index is 26.45  kg/m.  Physical Exam Vitals and nursing note reviewed.  Constitutional:      General: He is not in acute distress.    Appearance: He is well-developed.  HENT:     Head: Normocephalic and atraumatic.     Mouth/Throat:     Mouth: Mucous membranes are moist.     Pharynx: Oropharynx is clear.  Eyes:     Conjunctiva/sclera: Conjunctivae normal.  Cardiovascular:     Rate and Rhythm: Normal rate and regular rhythm.     Pulses:          Posterior tibial pulses are 2+ on the right side and 2+ on the left side.     Heart sounds: Murmur (SEM LUSB and RUSB) heard.  Pulmonary:     Effort: Pulmonary effort is normal. No respiratory distress.     Breath sounds: Normal breath sounds.  Abdominal:     Palpations: Abdomen is soft. There is no hepatomegaly or mass.     Tenderness: There is no abdominal tenderness.  Lymphadenopathy:     Cervical: No cervical adenopathy.  Skin:    General: Skin is warm.     Findings: No erythema or rash.  Neurological:     Mental Status: He is alert and oriented to person, place, and time.     Cranial Nerves: No cranial nerve deficit.     Gait: Gait normal.  Psychiatric:        [name] and Affect: [name] and affect normal.   ASSESSMENT AND PLAN:  Mr. Troy [name] was seen today for establish care.  Diagnoses and all orders for this visit: Lab Results  Component Value Date   CHOL 125 09/30/2022   HDL 33.90 (L) 09/30/2022   LDLCALC 65 09/30/2022   TRIG 130.0 09/30/2022   CHOLHDL 4 09/30/2022   Lab Results  Component Value Date   CREATININE 1.01 09/30/2022   BUN 9 09/30/2022   NA  141 09/30/2022   K 4.3 09/30/2022   CL 103 09/30/2022   CO2 30 09/30/2022   Lab Results  Component Value Date   ALT 29 09/30/2022   AST 22 09/30/2022   ALKPHOS 79 09/30/2022   BILITOT 1.1 09/30/2022   Hyperlipidemia, unspecified hyperlipidemia type Assessment & Plan: Continue Atorvastatin 40 mg daily and low fat diet. LDL 58 in 09/2020, I do not have copy of FLP done in  09/2021. Further recommendations according to FLP results.  Orders: -     Lipid panel; Future  Anxiety disorder, unspecified type Assessment & Plan: Problem reported as stable. He has been on Clonazepam 0.5 mg bid for years and tolerating well. PMP reviewed. He still has some refills left. Will plan on signing med contract next visit.   PAD (peripheral artery disease) (HCC) Assessment & Plan: S/P right carotid endarterectomy. He is on Atorvastatin 40 mg daily and Aspirin 81 mg daily. Follows with vascular.  Orders: -     Comprehensive metabolic panel; Future -     Lipid panel; Future  Essential (primary) hypertension Assessment & Plan: BP adequately controlled. Continue Losartan 25 mg daily. Monitor BP regularly. Continue low salt diet. F/U in 5 months.   Encounter for HCV screening test for low risk patient -     Hepatitis C antibody; Future  Malignant neoplasm of prostate [facility]) Assessment & Plan: S/P surgical treatment. Follows with urologist annually. Last PSA 0.0 in 12/2021.    Return in about 5 months (around 03/02/2023).  Mercades Bajaj G. [place], MD  [facility]. [place] office.

## 2022-09-30 ENCOUNTER — Encounter: Payer: Self-pay | Admitting: Family Medicine

## 2022-09-30 ENCOUNTER — Ambulatory Visit: Payer: Medicare HMO | Admitting: Family Medicine

## 2022-09-30 VITALS — BP 124/80 | HR 64 | Temp 98.5°F | Resp 16 | Ht 69.0 in | Wt 179.1 lb

## 2022-09-30 DIAGNOSIS — I739 Peripheral vascular disease, unspecified: Secondary | ICD-10-CM | POA: Diagnosis not present

## 2022-09-30 DIAGNOSIS — Z1159 Encounter for screening for other viral diseases: Secondary | ICD-10-CM

## 2022-09-30 DIAGNOSIS — E785 Hyperlipidemia, unspecified: Secondary | ICD-10-CM | POA: Insufficient documentation

## 2022-09-30 DIAGNOSIS — I10 Essential (primary) hypertension: Secondary | ICD-10-CM | POA: Diagnosis not present

## 2022-09-30 DIAGNOSIS — C61 Malignant neoplasm of prostate: Secondary | ICD-10-CM | POA: Diagnosis not present

## 2022-09-30 DIAGNOSIS — F419 Anxiety disorder, unspecified: Secondary | ICD-10-CM | POA: Diagnosis not present

## 2022-09-30 HISTORY — DX: Hyperlipidemia, unspecified: E78.5

## 2022-09-30 LAB — COMPREHENSIVE METABOLIC PANEL
ALT: 29 U/L (ref 0–53)
AST: 22 U/L (ref 0–37)
Albumin: 4.7 g/dL (ref 3.5–5.2)
Alkaline Phosphatase: 79 U/L (ref 39–117)
BUN: 9 mg/dL (ref 6–23)
CO2: 30 mEq/L (ref 19–32)
Calcium: 10.1 mg/dL (ref 8.4–10.5)
Chloride: 103 mEq/L (ref 96–112)
Creatinine, Ser: 1.01 mg/dL (ref 0.40–1.50)
GFR: 78.54 mL/min (ref >60.00)
Glucose, Bld: 90 mg/dL (ref 70–99)
Potassium: 4.3 mEq/L (ref 3.5–5.1)
Sodium: 141 mEq/L (ref 135–145)
Total Bilirubin: 1.1 mg/dL (ref 0.2–1.2)
Total Protein: 7.4 g/dL (ref 6.0–8.3)

## 2022-09-30 LAB — LIPID PANEL
Cholesterol: 125 mg/dL (ref 0–200)
HDL: 33.9 mg/dL — ABNORMAL LOW (ref >39.00)
LDL Cholesterol: 65 mg/dL (ref 0–99)
NonHDL: 91.12
Total CHOL/HDL Ratio: 4
Triglycerides: 130 mg/dL (ref 0.0–149.0)
VLDL: 26 mg/dL (ref 0.0–40.0)

## 2022-09-30 NOTE — Patient Instructions (Addendum)
A few things to remember from today's visit:  Hyperlipidemia, unspecified hyperlipidemia type - Plan: Lipid panel  Anxiety disorder, unspecified type  PAD (peripheral artery disease) (HCC) - Plan: Comprehensive metabolic panel, Lipid panel  Essential (primary) hypertension  Encounter for HCV screening test for low risk patient - Plan: Hepatitis C antibody  No changes today. I will see you back in 5 months.  If you need refills for medications you take chronically, please call your pharmacy. Do not use My Chart to request refills or for acute issues that need immediate attention. If you send a my chart message, it may take a few days to be addressed, specially if I am not in the office.  Please be sure medication list is accurate. If a new problem present, please set up appointment sooner than planned today.

## 2022-10-01 LAB — HEPATITIS C ANTIBODY: Hepatitis C Ab: NONREACTIVE

## 2022-10-02 DIAGNOSIS — C61 Malignant neoplasm of prostate: Secondary | ICD-10-CM | POA: Insufficient documentation

## 2022-10-02 NOTE — Assessment & Plan Note (Signed)
BP adequately controlled. Continue Losartan 25 mg daily. Monitor BP regularly. Continue low salt diet. F/U in 5 months.

## 2022-10-02 NOTE — Assessment & Plan Note (Signed)
S/P surgical treatment. Follows with urologist annually. Last PSA 0.0 in 12/2021.

## 2022-10-02 NOTE — Assessment & Plan Note (Signed)
Continue Atorvastatin 40 mg daily and low fat diet. LDL 58 in 09/2020, I do not have copy of FLP done in 09/2021. Further recommendations according to FLP results.

## 2022-10-02 NOTE — Assessment & Plan Note (Signed)
Problem reported as stable. He has been on Clonazepam 0.5 mg bid for years and tolerating well. PMP reviewed. He still has some refills left. Will plan on signing med contract next visit.

## 2022-10-02 NOTE — Assessment & Plan Note (Signed)
S/P right carotid endarterectomy. He is on Atorvastatin 40 mg daily and Aspirin 81 mg daily. Follows with vascular.

## 2022-10-05 ENCOUNTER — Encounter: Payer: Self-pay | Admitting: Family Medicine

## 2022-10-13 ENCOUNTER — Encounter: Payer: Self-pay | Admitting: Internal Medicine

## 2022-10-13 NOTE — Progress Notes (Unsigned)
ACUTE VISIT Chief Complaint  Patient presents with   Ear Problem    Buzzing in both ears, ongoing x a month, getting louder.    HPI: Troy [name] is a 67 y.o. male, who is here today complaining of constant "buzzing" in both ears, which started approximately a month ago. He reports having forgotten to mention this issue during his previous visit on July 3rd.  Tinnitus: Constant buzzing in both left and right ears for the past month.  He has noticed an increase in headaches since the onset of the buzzing noise. The headaches are described as being located in the bipemporal region, with a severity of 5 out of 10. These headaches occur more frequently in the mornings, with no associated nausea or vomiting reported.  He has a history of carotid artery stenosis and underwent surgery on one side. He has been taking baby aspirin since July 2023, following the surgery. The patient also takes Advil for his headaches but has not tried any over-the-counter medications specifically for the buzzing noise in his ears.  Review of Systems See other pertinent positives and negatives in HPI.  Current Outpatient Medications on File Prior to Visit  Medication Sig Dispense Refill   ascorbic acid (VITAMIN C) 500 MG tablet Take 500 mg by mouth daily. Takes 2x/week     aspirin EC 81 MG tablet Take 81 mg by mouth daily. Swallow whole.     atorvastatin (LIPITOR) 40 MG tablet Take 1 tablet (40 mg total) by mouth daily. 30 tablet    cetirizine (ZYRTEC) 10 MG tablet Take 10 mg by mouth as needed for allergies.     clonazePAM (KLONOPIN) 0.5 MG tablet Take 0.5 mg by mouth 2 (two) times daily as needed for anxiety.     esomeprazole (NEXIUM) 40 MG capsule Take 40 mg by mouth daily.     hyoscyamine (LEVSIN SL) 0.125 MG SL tablet PLACE 1 TABLET UNDER THE TONGUE EVERY 8 (EIGHT) HOURS AS NEEDED. ABDOMINAL CRAMPING AND DIARRHEA. 30 tablet 2   ibuprofen (ADVIL) 200 MG tablet Take 200 mg by mouth as needed.      losartan (COZAAR) 25 MG tablet Take 25 mg by mouth daily.      Polyethyl Glycol-Propyl Glycol (SYSTANE OP) Place 1 drop into both eyes daily.     psyllium (METAMUCIL) 58.6 % packet Take 1 packet by mouth as needed.     tadalafil (CIALIS) 5 MG tablet Take 5 mg by mouth as needed for erectile dysfunction.     No current facility-administered medications on file prior to visit.   Past Medical History:  Diagnosis Date   Allergy    Seasonal   Anxiety    Aortic stenosis 05/19/2018   Mild by echo 04/2018, mean grad   Arthritis    back   Cancer [facility])    prostate cancer   Complication of anesthesia    Diverticula of colon    left side.   GERD (gastroesophageal reflux disease)    food related   Herniated disc    3 herniated discs in neck   History of kidney stones    Hyperlipidemia 09/30/2022   Hypertension    Left bundle branch block    Noted since 2016 on EKG   PONV (postoperative nausea and vomiting)    Seasonal allergies    Allergies  Allergen Reactions   Penicillins Other (See Comments)    Did it involve swelling of the face/tongue/throat, SOB, or low BP? No Did  it involve sudden or severe rash/hives, skin peeling, or any reaction on the inside of your mouth or nose? No Did you need to seek medical attention at a hospital or doctor's office? No When did it last happen?      Over 10 years If all above answers are "NO", may proceed with cephalosporin use.    Numbness around mouth    Ciprofloxacin Rash   Darvocet [Propoxyphene N-Acetaminophen] Rash   Tylenol [Acetaminophen] Rash    Social History   Socioeconomic History   Marital status: Married    Spouse name: Vang Mcelmurry   Number of children: 2   Years of education: Not on file   Highest education level: 12th grade  Occupational History   Occupation: retired    Comment: Optician, 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[place]   Tobacco Use   Smoking status: Never    Passive exposure: Never   Smokeless tobacco: Never  Vaping Use    Vaping status: Never Used  Substance and Sexual Activity   Alcohol use: No   Drug use: No   Sexual activity: Yes  Other Topics Concern   Not on file  Social History Narrative   Not on file   Social Determinants of Health   Financial Resource Strain: Low Risk  (10/13/2022)   Overall Financial Resource Strain (CARDIA)    Difficulty of Paying Living Expenses: Not very hard  Food Insecurity: No Food Insecurity (10/13/2022)   Hunger Vital Sign    Worried About Running Out of Food in the Last Year: Never true    Ran Out of Food in the Last Year: Never true  Transportation Needs: No Transportation Needs (10/13/2022)   PRAPARE - Administrator, Civil Service (Medical): No    Lack of Transportation (Non-Medical): No  Physical Activity: Unknown (10/13/2022)   Exercise Vital Sign    Days of Exercise per Week: 0 days    Minutes of Exercise per Session: Not on file  Stress: Stress Concern Present (10/13/2022)   [employer] of Occupational Health - Occupational Stress Questionnaire    Feeling of Stress : To some extent  Social Connections: Moderately Integrated (10/13/2022)   Social Connection and Isolation Panel [NHANES]    Frequency of Communication with Friends and Family: Once a week    Frequency of Social Gatherings with Friends and Family: Once a week    Attends Religious Services: More than 4 times per year    Active Member of [employer] or Organizations: Yes    Attends Banker Meetings: More than 4 times per year    Marital Status: Married   Vitals:   10/14/22 1613  BP: 126/80  Pulse: 75  Resp: 16  SpO2: 97%   Body mass index is 26.32 kg/m.  Physical Exam Vitals and nursing note reviewed.  Constitutional:      General: He is not in acute distress.    Appearance: He is well-developed.  HENT:     Head: Normocephalic and atraumatic.     Right Ear: Tympanic membrane, ear canal and external ear normal.     Left Ear: Tympanic membrane, ear canal and  external ear normal.     Mouth/Throat:     Mouth: Mucous membranes are moist.     Pharynx: Oropharynx is clear.  Eyes:     Conjunctiva/sclera: Conjunctivae normal.  Cardiovascular:     Rate and Rhythm: Normal rate and regular rhythm.     Pulses:  Posterior tibial pulses are 2+ on the right side and 2+ on the left side.     Heart sounds: Murmur (SEM LUSB and RUSB) heard.  Pulmonary:     Effort: Pulmonary effort is normal. No respiratory distress.     Breath sounds: Normal breath sounds.  Abdominal:     Palpations: Abdomen is soft. There is no hepatomegaly or mass.     Tenderness: There is no abdominal tenderness.  Lymphadenopathy:     Cervical: No cervical adenopathy.  Skin:    General: Skin is warm.     Findings: No erythema or rash.  Neurological:     Mental Status: He is alert and oriented to person, place, and time.     Cranial Nerves: No cranial nerve deficit.     Gait: Gait normal.  Psychiatric:        Mood and Affect: Mood and affect normal.    ASSESSMENT AND PLAN: Tinnitus of both ears  Headache, unspecified headache type    Return if symptoms worsen or fail to improve, for keep next appointment.  Aamari West G. [place], MD  [facility]. [place] office.  Discharge Instructions   None

## 2022-10-13 NOTE — Telephone Encounter (Signed)
Pt is coming in tomorrow  °

## 2022-10-14 ENCOUNTER — Ambulatory Visit (INDEPENDENT_AMBULATORY_CARE_PROVIDER_SITE_OTHER): Payer: Medicare HMO | Admitting: Family Medicine

## 2022-10-14 ENCOUNTER — Encounter: Payer: Self-pay | Admitting: Family Medicine

## 2022-10-14 VITALS — BP 126/80 | HR 75 | Resp 16 | Ht 69.0 in | Wt 178.2 lb

## 2022-10-14 DIAGNOSIS — H9313 Tinnitus, bilateral: Secondary | ICD-10-CM | POA: Diagnosis not present

## 2022-10-14 DIAGNOSIS — R519 Headache, unspecified: Secondary | ICD-10-CM

## 2022-10-14 NOTE — Patient Instructions (Addendum)
A few things to remember from today's visit:  Tinnitus of both ears  Headache, unspecified headache type History does not sound concerning for a serious problem. I do not think headache is related.  If you need refills for medications you take chronically, please call your pharmacy. Do not use My Chart to request refills or for acute issues that need immediate attention. If you send a my chart message, it may take a few days to be addressed, specially if I am not in the office.  Please be sure medication list is accurate. If a new problem present, please set up appointment sooner than planned today.

## 2022-11-03 ENCOUNTER — Encounter: Payer: Self-pay | Admitting: Internal Medicine

## 2022-11-03 ENCOUNTER — Ambulatory Visit: Payer: Medicare HMO | Admitting: Internal Medicine

## 2022-11-03 ENCOUNTER — Telehealth: Payer: Self-pay | Admitting: Internal Medicine

## 2022-11-03 VITALS — BP 139/82 | HR 67 | Temp 97.7°F | Ht 69.0 in | Wt 180.0 lb

## 2022-11-03 DIAGNOSIS — K219 Gastro-esophageal reflux disease without esophagitis: Secondary | ICD-10-CM

## 2022-11-03 NOTE — Progress Notes (Unsigned)
Primary Care Physician:  [place], Betty G, MD Primary Gastroenterologist:  Dr. Jena Gauss  Pre-Procedure History & Physical: HPI:  Troy [name] is a 67 y.o. male here for GERD.  Occasional abdominal cramps and diarrhea uses Levsin infrequently.  No rectal bleeding.  Negative colonoscopy 2020; due for 1 more screening exam 2030.  Reflux well-controlled on Nexium.  No dysphagia.  He takes it before breakfast.  Reviewed records to check on the last EGD.  Cannot find the report.  Records go back to 2015.  He is retired from [employer]  Past Medical History:  Diagnosis Date   Allergy    Seasonal   Anxiety    Aortic stenosis 05/19/2018   Mild by echo 04/2018, mean grad   Arthritis    back   Cancer [facility])    prostate cancer   Complication of anesthesia    Diverticula of colon    left side.   GERD (gastroesophageal reflux disease)    food related   Herniated disc    3 herniated discs in neck   History of kidney stones    Hyperlipidemia 09/30/2022   Hypertension    Left bundle branch block    Noted since 2016 on EKG   PONV (postoperative nausea and vomiting)    Seasonal allergies     Past Surgical History:  Procedure Laterality Date   BIOPSY  07/27/2018   Procedure: BIOPSY;  Surgeon: Corbin Ade, MD;  Location: [facility];  Service: Endoscopy;;   COLONOSCOPY  01/09/2008   Dr.- normal rectum, L sided diverticulum. colonic mucosa and terminal ileum mucosa appeared normal.   COLONOSCOPY  1996   colonic adenomas   COLONOSCOPY  2001   L sided diverticula   COLONOSCOPY N/A 02/12/2014   Dr. Jena Gauss: diverticulosis   COLONOSCOPY N/A 07/27/2018   Procedure: COLONOSCOPY;  Surgeon: Corbin Ade, MD;  Location: [facility];  Service: Endoscopy;  Laterality: N/A;  2:00pm   ENDARTERECTOMY Right 10/21/2020   Procedure: RIGHT CAROTID ENDARTERECTOMY;  Surgeon: Cephus Shelling, MD;  Location: [facility] OR;  Service: Vascular;  Laterality: Right;   HERNIA REPAIR Left  2008   LITHOTRIPSY     x2   NECK SURGERY     PATCH ANGIOPLASTY Right 10/21/2020   Procedure: PATCH ANGIOPLASTY USING Livia Snellen BIOLOGIC PATCH;  Surgeon: Cephus Shelling, MD;  Location[facility] OR;  Service: Vascular;  Laterality: Right;   ROBOT ASSISTED LAPAROSCOPIC RADICAL PROSTATECTOMY N/A 01/25/2013   Procedure: ROBOTIC ASSISTED LAPAROSCOPIC RADICAL PROSTATECTOMY LEVEL 1;  Surgeon: Milford Cage, MD;  Location: [facility] ORS;  Service: Urology;  Laterality: N/A;   SPINE SURGERY  2017 - 2018   Neck surgery    Prior to Admission medications   Medication Sig Start Date End Date Taking? Authorizing Provider  ascorbic acid (VITAMIN C) 500 MG tablet Take 500 mg by mouth daily. Takes 2x/week   Yes [provider]  aspirin EC 81 MG tablet Take 81 mg by mouth daily. Swallow whole.   Yes [provider]  atorvastatin (LIPITOR) 40 MG tablet Take 1 tablet (40 mg total) by mouth daily. 10/22/20  Yes Emilie Rutter, PA-C  cetirizine (ZYRTEC) 10 MG tablet Take 10 mg by mouth as needed for allergies.   Yes [provider]  clonazePAM (KLONOPIN) 0.5 MG tablet Take 0.5 mg by mouth 2 (two) times daily as needed for anxiety. 09/03/22  Yes [provider]  esomeprazole (NEXIUM) 40 MG capsule Take 40 mg  by mouth daily. 09/16/20  Yes [provider]  hyoscyamine (LEVSIN SL) 0.125 MG SL tablet PLACE 1 TABLET UNDER THE TONGUE EVERY 8 (EIGHT) HOURS AS NEEDED. ABDOMINAL CRAMPING AND DIARRHEA. 10/29/21  Yes Gelene Mink, NP  ibuprofen (ADVIL) 200 MG tablet Take 200 mg by mouth as needed.   Yes [provider]  losartan (COZAAR) 25 MG tablet Take 25 mg by mouth daily.    Yes [provider]  Polyethyl Glycol-Propyl Glycol (SYSTANE OP) Place 1 drop into both eyes daily.   Yes [provider]  psyllium (METAMUCIL) 58.6 % packet Take 1 packet by mouth as needed.   Yes [provider]  tadalafil (CIALIS) 5 MG tablet Take 5 mg by mouth as needed  for erectile dysfunction.   Yes [provider]    Allergies as of 11/03/2022 - Review Complete 11/03/2022  Allergen Reaction Noted   Penicillins Other (See Comments) 07/10/2011   Ciprofloxacin Rash 01/26/2013   Darvocet [propoxyphene n-acetaminophen] Rash 07/10/2011   Tylenol [acetaminophen] Rash 07/10/2011    Family History  Problem Relation Age of Onset   Dementia Mother    CAD Father    Prostate cancer Father    Cancer Father    Heart disease Father    Hypertension Brother    Hyperlipidemia Brother    Diabetes Brother    Prostate cancer Paternal Uncle    CAD Paternal Aunt    Kidney cancer Paternal Aunt    Cancer Paternal Aunt    Dementia Maternal Grandmother    Colon cancer Neg Hx    Colon polyps Neg Hx     Social History   Socioeconomic History   Marital status: Married    Spouse name: Yale Wilner   Number of children: 2   Years of education: Not on file   Highest education level: 12th grade  Occupational History   Occupation: retired    Comment: Optician, 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[place]   Tobacco Use   Smoking status: Never    Passive exposure: Never   Smokeless tobacco: Never  Vaping Use   Vaping status: Never Used  Substance and Sexual Activity   Alcohol use: No   Drug use: No   Sexual activity: Yes  Other Topics Concern   Not on file  Social History Narrative   Not on file   Social Determinants of Health   Financial Resource Strain: Low Risk  (10/13/2022)   Overall Financial Resource Strain (CARDIA)    Difficulty of Paying Living Expenses: Not very hard  Food Insecurity: No Food Insecurity (10/13/2022)   Hunger Vital Sign    Worried About Running Out of Food in the Last Year: Never true    Ran Out of Food in the Last Year: Never true  Transportation Needs: No Transportation Needs (10/13/2022)   PRAPARE - Administrator, Civil Service (Medical): No    Lack of Transportation (Non-Medical): No  Physical Activity: Unknown (10/13/2022)    Exercise Vital Sign    Days of Exercise per Week: 0 days    Minutes of Exercise per Session: Not on file  Stress: Stress Concern Present (10/13/2022)   [employer] of Occupational Health - Occupational Stress Questionnaire    Feeling of Stress : To some extent  Social Connections: Moderately Integrated (10/13/2022)   Social Connection and Isolation Panel [NHANES]    Frequency of Communication with Friends and Family: Once a week    Frequency of Social Gatherings with Friends and Family: Once  a week    Attends Religious Services: More than 4 times per year    Active Member of Clubs or Organizations: Yes    Attends Banker Meetings: More than 4 times per year    Marital Status: Married  Catering manager Violence: Not At Risk (06/09/2018)   Humiliation, Afraid, Rape, and Kick questionnaire    Fear of Current or Ex-Partner: No    Emotionally Abused: No    Physically Abused: No    Sexually Abused: No    Review of Systems: See HPI, otherwise negative ROS  Physical Exam: BP 139/82 (BP Location: Right Arm, Patient Position: Sitting, Cuff Size: Large)   Pulse 67   Temp 97.7 F (36.5 C) (Oral)   Ht 5\' 9"  (1.753 m)   Wt 180 lb (81.6 kg)   SpO2 95%   BMI 26.58 kg/m  General:   Alert,  Well-developed, well-nourished, pleasant and cooperative in NAD Abdomen: Non-distended, normal bowel sounds.  Soft and nontender without appreciable mass or hepatosplenomegaly.  Impression/Plan: 67 year old gentleman with longstanding GERD without alarm features well-controlled on once daily PPI.  Record of last EGD not readily available.  Will research the prior record.  Rare bouts of abdominal cramps and diarrhea consistent with IBS treated with Levsin.  No alarm symptoms.  Is recommended he return in 2030 for 1 more screening colonoscopy.     Notice: This dictation was prepared with Dragon dictation along with smaller phrase technology. Any transcriptional errors that result from this  process are unintentional and may not be corrected upon review.

## 2022-11-03 NOTE — Telephone Encounter (Signed)
I have faxed a release of records to [facility] HIM dept asking for any EGD/Path records on patient and dated it for 20 years back to present.

## 2022-11-03 NOTE — Telephone Encounter (Signed)
Noted, thanks!

## 2022-11-03 NOTE — Patient Instructions (Signed)
It was good to see you again today!  Continue Nexium 40 mg daily-best taken before breakfast.  I have discussed the long-term benefits outweigh the risk.  At this time, I cannot find documentation of prior EGD records go back to 2015.  Would like to review the prior EGD report with you when it becomes available.  Recommend 1 more screening colonoscopy in 2030  Unless something comes up, plan to see back in 1 year.

## 2022-11-06 ENCOUNTER — Ambulatory Visit: Payer: Medicare HMO

## 2022-11-06 VITALS — Ht 69.0 in | Wt 180.0 lb

## 2022-11-06 DIAGNOSIS — Z0000 Encounter for general adult medical examination without abnormal findings: Secondary | ICD-10-CM

## 2022-11-06 NOTE — Progress Notes (Signed)
Subjective:   Troy [name] is a 67 y.o. male who presents for Medicare Annual/Subsequent preventive examination.  Visit Complete: Virtual  I connected with  Troy [name] on 11/06/22 by a audio enabled telemedicine application and verified that I am speaking with the correct person using two identifiers.  Patient Location: Home  Provider Location: Home Office  I discussed the limitations of evaluation and management by telemedicine. The patient expressed understanding and agreed to proceed.  Patient Medicare AWV questionnaire was completed by the patient on 11/02/22; I have confirmed that all information answered by patient is correct and no changes since this date.  Review of Systems    Vital Signs: Unable to obtain new vitals due to this being a telehealth visit.  Cardiac Risk Factors include: advanced age (>69men, >46 women);male gender;hypertension     Objective:    Today's Vitals   11/06/22 1309  Weight: 180 lb (81.6 kg)  Height: 5\' 9"  (1.753 m)   Body mass index is 26.58 kg/m.     11/06/2022    1:14 PM 11/12/2020   10:21 AM 10/21/2020    8:00 PM 10/17/2020   10:25 AM 10/21/2018   10:10 AM 07/22/2018   11:45 AM 06/27/2018   10:09 AM  Advanced Directives  Does Patient Have a Medical Advance Directive? No No No No No No No  Would patient like information on creating a medical advance directive? No - Patient declined  No - Patient declined No - Patient declined No - Patient declined No - Patient declined No - Patient declined    Current Medications (verified) Outpatient Encounter Medications as of 11/06/2022  Medication Sig   ascorbic acid (VITAMIN C) 500 MG tablet Take 500 mg by mouth daily. Takes 2x/week   aspirin EC 81 MG tablet Take 81 mg by mouth daily. Swallow whole.   atorvastatin (LIPITOR) 40 MG tablet Take 1 tablet (40 mg total) by mouth daily.   cetirizine (ZYRTEC) 10 MG tablet Take 10 mg by mouth as needed for allergies.   clonazePAM (KLONOPIN) 0.5 MG  tablet Take 0.5 mg by mouth 2 (two) times daily as needed for anxiety.   esomeprazole (NEXIUM) 40 MG capsule Take 40 mg by mouth daily.   hyoscyamine (LEVSIN SL) 0.125 MG SL tablet PLACE 1 TABLET UNDER THE TONGUE EVERY 8 (EIGHT) HOURS AS NEEDED. ABDOMINAL CRAMPING AND DIARRHEA.   ibuprofen (ADVIL) 200 MG tablet Take 200 mg by mouth as needed.   losartan (COZAAR) 25 MG tablet Take 25 mg by mouth daily.    Polyethyl Glycol-Propyl Glycol (SYSTANE OP) Place 1 drop into both eyes daily.   psyllium (METAMUCIL) 58.6 % packet Take 1 packet by mouth as needed.   tadalafil (CIALIS) 5 MG tablet Take 5 mg by mouth as needed for erectile dysfunction.   No facility-administered encounter medications on file as of 11/06/2022.    Allergies (verified) Penicillins, Ciprofloxacin, Darvocet [propoxyphene n-acetaminophen], and Tylenol [acetaminophen]   History: Past Medical History:  Diagnosis Date   Allergy    Seasonal   Anxiety    Aortic stenosis 05/19/2018   Mild by echo 04/2018, [facility] grad   Arthritis    back   Cancer [facility])    prostate cancer   Complication of anesthesia    Diverticula of colon    left side.   GERD (gastroesophageal reflux disease)    food related   Herniated disc    3 herniated discs in neck   History of kidney stones  Hyperlipidemia 09/30/2022   Hypertension    Left bundle branch block    Noted since 2016 on EKG   PONV (postoperative nausea and vomiting)    Seasonal allergies    Past Surgical History:  Procedure Laterality Date   BIOPSY  07/27/2018   Procedure: BIOPSY;  Surgeon: Corbin Ade, MD;  Location: [facility];  Service: Endoscopy;;   COLONOSCOPY  01/09/2008   Dr.Rourk- normal rectum, L sided diverticulum. colonic mucosa and terminal ileum mucosa appeared normal.   COLONOSCOPY  1996   colonic adenomas   COLONOSCOPY  2001   L sided diverticula   COLONOSCOPY N/A 02/12/2014   Dr. Jena Gauss: diverticulosis   COLONOSCOPY N/A 07/27/2018   Procedure:  COLONOSCOPY;  Surgeon: Corbin Ade, MD;  Location: [facility];  Service: Endoscopy;  Laterality: N/A;  2:00pm   ENDARTERECTOMY Right 10/21/2020   Procedure: RIGHT CAROTID ENDARTERECTOMY;  Surgeon: Cephus Shelling, MD;  Location: [facility] OR;  Service: Vascular;  Laterality: Right;   HERNIA REPAIR Left 2008   LITHOTRIPSY     x2   NECK SURGERY     PATCH ANGIOPLASTY Right 10/21/2020   Procedure: PATCH ANGIOPLASTY USING Livia Snellen BIOLOGIC PATCH;  Surgeon: Cephus Shelling, MD;  Location: [facility] OR;  Service: Vascular;  Laterality: Right;   ROBOT ASSISTED LAPAROSCOPIC RADICAL PROSTATECTOMY N/A 01/25/2013   Procedure: ROBOTIC ASSISTED LAPAROSCOPIC RADICAL PROSTATECTOMY LEVEL 1;  Surgeon: Milford Cage, MD;  Location: [facility] ORS;  Service: Urology;  Laterality: N/A;   SPINE SURGERY  2017 - 2018   Neck surgery   Family History  Problem Relation Age of Onset   Dementia Mother    CAD Father    Prostate cancer Father    Cancer Father    Heart disease Father    Hypertension Brother    Hyperlipidemia Brother    Diabetes Brother    Prostate cancer Paternal Uncle    CAD Paternal Aunt    Kidney cancer Paternal Aunt    Cancer Paternal Aunt    Dementia Maternal Grandmother    Colon cancer Neg Hx    Colon polyps Neg Hx    Social History   Socioeconomic History   Marital status: Married    Spouse name: Troy [name]   Number of children: 2   Years of education: Not on file   Highest education level: 12th grade  Occupational History   Occupation: retired    Comment: Optician, 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[place]   Tobacco Use   Smoking status: Never    Passive exposure: Never   Smokeless tobacco: Never  Vaping Use   Vaping status: Never Used  Substance and Sexual Activity   Alcohol use: No   Drug use: No   Sexual activity: Yes  Other Topics Concern   Not on file  Social History Narrative   Not on file   Social Determinants of Health   Financial Resource Strain: Low Risk  (10/13/2022)    Overall Financial Resource Strain (CARDIA)    Difficulty of Paying Living Expenses: Not very hard  Food Insecurity: No Food Insecurity (11/06/2022)   Hunger Vital Sign    Worried About Running Out of Food in the Last Year: Never true    Ran Out of Food in the Last Year: Never true  Transportation Needs: No Transportation Needs (11/06/2022)   PRAPARE - Administrator, Civil Service (Medical): No    Lack of Transportation (Non-Medical): No  Physical Activity: Inactive (11/06/2022)   Exercise Vital  Sign    Days of Exercise per Week: 0 days    Minutes of Exercise per Session: 0 min  Stress: No Stress Concern Present (11/06/2022)   [employer] of Occupational Health - Occupational Stress Questionnaire    Feeling of Stress : Not at all  Recent Concern: Stress - Stress Concern Present (10/13/2022)   [employer] of Occupational Health - Occupational Stress Questionnaire    Feeling of Stress : To some extent  Social Connections: Socially Integrated (11/06/2022)   Social Connection and Isolation Panel [NHANES]    Frequency of Communication with Friends and Family: More than three times a week    Frequency of Social Gatherings with Friends and Family: More than three times a week    Attends Religious Services: More than 4 times per year    Active Member of [employer] or Organizations: Yes    Attends Engineer, structural: More than 4 times per year    Marital Status: Married    Tobacco Counseling Counseling given: Not Answered   Clinical Intake:  Pre-visit preparation completed: Yes  Pain : No/denies pain     BMI - recorded: 26.58 Nutritional Status: BMI 25 -29 Overweight Nutritional Risks: None Diabetes: No  How often do you need to have someone help you when you read instructions, pamphlets, or other written materials from your doctor or pharmacy?: 1 - Never  Interpreter Needed?: No  Information entered by :: Theresa Mulligan LPN   Activities of Daily  Living    11/06/2022    1:13 PM 11/02/2022    9:57 AM  In your present state of health, do you have any difficulty performing the following activities:  Hearing? 0 0  Vision? 0 0  Difficulty concentrating or making decisions? 0 0  Walking or climbing stairs? 0 0  Dressing or bathing? 0 0  Doing errands, shopping? 0 0  Preparing Food and eating ? N N  Using the Toilet? N N  In the past six months, have you accidently leaked urine? N N  Do you have problems with loss of bowel control? N N  Managing your Medications? N N  Managing your Finances? N N  Housekeeping or managing your Housekeeping? N N    Patient Care Team: [place], Betty G, MD as PCP - General (Family Medicine) Wyline Mood Dorothe Pea, MD as Consulting Physician (Cardiology)  Indicate any recent Medical Services you may have received from other than [facility] providers in the past year (date may be approximate).     Assessment:   This is a routine wellness examination for Tiofilo.  Hearing/Vision screen Hearing Screening - Comments:: Denies hearing difficulties   Vision Screening - Comments:: Wears rx glasses - up to date with routine eye exams with  [facility]  Dietary issues and exercise activities discussed:     Goals Addressed               This Visit's Progress     Increase physical activity (pt-stated)         Depression Screen    11/06/2022    1:13 PM 09/30/2022   10:57 AM 11/12/2020   10:21 AM  PHQ 2/9 Scores  PHQ - 2 Score 0 0 0    Fall Risk    11/06/2022    1:14 PM 11/02/2022    9:57 AM 09/30/2022   10:57 AM 11/12/2020   10:21 AM  Fall Risk   Falls in the past year? 0 0 0 0  Number falls in past yr: 0  0   Injury with Fall? 0  0   Risk for fall due to : No Fall Risks  Other (Comment)   Follow up Falls prevention discussed  Falls evaluation completed     MEDICARE RISK AT HOME:  Medicare Risk at Home - 11/06/22 1318     Any stairs in or around the home? Yes    If so, are there any without  handrails? No    Home free of loose throw rugs in walkways, pet beds, electrical cords, etc? Yes    Adequate lighting in your home to reduce risk of falls? Yes    Life alert? No    Use of a cane, walker or w/c? No    Grab bars in the bathroom? No    Shower chair or bench in shower? Yes    Elevated toilet seat or a handicapped toilet? No             TIMED UP AND GO:  Was the test performed?  No    Cognitive Function:        11/06/2022    1:15 PM  6CIT Screen  What Year? 0 points  What month? 0 points  What time? 0 points  Count back from 20 0 points  Months in reverse 0 points  Repeat phrase 0 points  Total Score 0 points    Immunizations  There is no immunization history on file for this patient.  TDAP status: Due, Education has been provided regarding the importance of this vaccine. Advised may receive this vaccine at local pharmacy or Health Dept. Aware to provide a copy of the vaccination record if obtained from local pharmacy or Health Dept. Verbalized acceptance and understanding.  Flu Vaccine status: Due, Education has been provided regarding the importance of this vaccine. Advised may receive this vaccine at local pharmacy or Health Dept. Aware to provide a copy of the vaccination record if obtained from local pharmacy or Health Dept. Verbalized acceptance and understanding.    Covid-19 vaccine status: Declined, Education has been provided regarding the importance of this vaccine but patient still declined. Advised may receive this vaccine at local pharmacy or Health Dept.or vaccine clinic. Aware to provide a copy of the vaccination record if obtained from local pharmacy or Health Dept. Verbalized acceptance and understanding.  Qualifies for Shingles Vaccine? Yes   Zostavax completed No   Shingrix Completed?: No.    Education has been provided regarding the importance of this vaccine. Patient has been advised to call insurance company to determine out of pocket  expense if they have not yet received this vaccine. Advised may also receive vaccine at local pharmacy or Health Dept. Verbalized acceptance and understanding.  Screening Tests Health Maintenance  Topic Date Due   COVID-19 Vaccine (1) Never done   DTaP/Tdap/Td (1 - Tdap) Never done   INFLUENZA VACCINE  10/29/2022   Zoster Vaccines- Shingrix (1 of 2) 01/02/2023 (Originally 02/21/1975)   Medicare Annual Wellness (AWV)  11/06/2023   Colonoscopy  07/26/2028   Hepatitis C Screening  Completed   HIV Screening  Completed   HPV VACCINES  Aged Out    Health Maintenance  Health Maintenance Due  Topic Date Due   COVID-19 Vaccine (1) Never done   DTaP/Tdap/Td (1 - Tdap) Never done   INFLUENZA VACCINE  10/29/2022    Colorectal cancer screening: Type of screening: Colonoscopy. Completed 07/27/18. Repeat every 10 years  Lung Cancer Screening: (Low  Dose CT Chest recommended if Age 92-80 years, 20 pack-year currently smoking OR have quit w/in 15years.) does not qualify.     Additional Screening:  Hepatitis C Screening: does qualify; Completed 10/02/22  Vision Screening: Recommended annual ophthalmology exams for early detection of glaucoma and other disorders of the eye. Is the patient up to date with their annual eye exam?  Yes  Who is the provider or what is the name of the office in which the patient attends annual eye exams? [facility] If pt is not established with a provider, would they like to be referred to a provider to establish care? No .   Dental Screening: Recommended annual dental exams for proper oral hygiene   Community Resource Referral / Chronic Care Management:  CRR required this visit?  No   CCM required this visit?  No     Plan:     I have personally reviewed and noted the following in the patient's chart:   Medical and social history Use of alcohol, tobacco or illicit drugs  Current medications and supplements including opioid prescriptions. Patient is  not currently taking opioid prescriptions. Functional ability and status Nutritional status Physical activity Advanced directives List of other physicians Hospitalizations, surgeries, and ER visits in previous 12 months Vitals Screenings to include cognitive, depression, and falls Referrals and appointments  In addition, I have reviewed and discussed with patient certain preventive protocols, quality metrics, and best practice recommendations. A written personalized care plan for preventive services as well as general preventive health recommendations were provided to patient.     Tillie Rung, LPN   05/09/5619   After Visit Summary: (MyChart) Due to this being a telephonic visit, the after visit summary with patients personalized plan was offered to patient via MyChart   Nurse Notes: Patient request f/u with concerns of continued inner ear buzzing. Patient would like Advised referral.

## 2022-11-06 NOTE — Patient Instructions (Addendum)
Mr. Routt , Thank you for taking time to come for your Medicare Wellness Visit. I appreciate your ongoing commitment to your health goals. Please review the following plan we discussed and let me know if I can assist you in the future.   Referrals/Orders/Follow-Ups/Clinician Recommendations:   This is a list of the screening recommended for you and due dates:  Health Maintenance  Topic Date Due   COVID-19 Vaccine (1) Never done   DTaP/Tdap/Td vaccine (1 - Tdap) Never done   Flu Shot  10/29/2022   Zoster (Shingles) Vaccine (1 of 2) 01/02/2023*   Medicare Annual Wellness Visit  11/06/2023   Colon Cancer Screening  07/26/2028   Hepatitis C Screening  Completed   HIV Screening  Completed   HPV Vaccine  Aged Out  *Topic was postponed. The date shown is not the original due date.    Advanced directives: (Declined) Advance directive discussed with you today. Even though you declined this today, please call our office should you change your mind, and we can give you the proper paperwork for you to fill out.  Next Medicare Annual Wellness Visit scheduled for next year: Yes  Preventive Care 40-64 Years, Male Preventive care refers to lifestyle choices and visits with your health care provider that can promote health and wellness. What does preventive care include? A yearly physical exam. This is also called an annual well check. Dental exams once or twice a year. Routine eye exams. Ask your health care provider how often you should have your eyes checked. Personal lifestyle choices, including: Daily care of your teeth and gums. Regular physical activity. Eating a healthy diet. Avoiding tobacco and drug use. Limiting alcohol use. Practicing safe sex. Taking low-dose aspirin every day starting at age 86. What happens during an annual well check? The services and screenings done by your health care provider during your annual well check will depend on your age, overall health, lifestyle  risk factors, and family history of disease. Counseling  Your health care provider may ask you questions about your: Alcohol use. Tobacco use. Drug use. Emotional well-being. Home and relationship well-being. Sexual activity. Eating habits. Work and work Astronomer. Screening  You may have the following tests or measurements: Height, weight, and BMI. Blood pressure. Lipid and cholesterol levels. These may be checked every 5 years, or more frequently if you are over 12 years old. Skin check. Lung cancer screening. You may have this screening every year starting at age 46 if you have a 30-pack-year history of smoking and currently smoke or have quit within the past 15 years. Fecal occult blood test (FOBT) of the stool. You may have this test every year starting at age 7. Flexible sigmoidoscopy or colonoscopy. You may have a sigmoidoscopy every 5 years or a colonoscopy every 10 years starting at age 9. Prostate cancer screening. Recommendations will vary depending on your family history and other risks. Hepatitis C blood test. Hepatitis B blood test. Sexually transmitted disease (STD) testing. Diabetes screening. This is done by checking your blood sugar (glucose) after you have not eaten for a while (fasting). You may have this done every 1-3 years. Discuss your test results, treatment options, and if necessary, the need for more tests with your health care provider. Vaccines  Your health care provider may recommend certain vaccines, such as: Influenza vaccine. This is recommended every year. Tetanus, diphtheria, and acellular pertussis (Tdap, Td) vaccine. You may need a Td booster every 10 years. Zoster vaccine. You may need  this after age 44. Pneumococcal 13-valent conjugate (PCV13) vaccine. You may need this if you have certain conditions and have not been vaccinated. Pneumococcal polysaccharide (PPSV23) vaccine. You may need one or two doses if you smoke cigarettes or if you have  certain conditions. Talk to your health care provider about which screenings and vaccines you need and how often you need them. This information is not intended to replace advice given to you by your health care provider. Make sure you discuss any questions you have with your health care provider. Document Released: 04/12/2015 Document Revised: 12/04/2015 Document Reviewed: 01/15/2015 Elsevier Interactive Patient Education  2017 [employer].  Fall Prevention in the Home Falls can cause injuries. They can happen to people of all ages. There are many things you can do to make your home safe and to help prevent falls. What can I do on the outside of my home? Regularly fix the edges of walkways and driveways and fix any cracks. Remove anything that might make you trip as you walk through a door, such as a raised step or threshold. Trim any bushes or trees on the path to your home. Use bright outdoor lighting. Clear any walking paths of anything that might make someone trip, such as rocks or tools. Regularly check to see if handrails are loose or broken. Make sure that both sides of any steps have handrails. Any raised decks and porches should have guardrails on the edges. Have any leaves, snow, or ice cleared regularly. Use sand or salt on walking paths during winter. Clean up any spills in your garage right away. This includes oil or grease spills. What can I do in the bathroom? Use night lights. Install grab bars by the toilet and in the tub and shower. Do not use towel bars as grab bars. Use non-skid mats or decals in the tub or shower. If you need to sit down in the shower, use a plastic, non-slip stool. Keep the floor dry. Clean up any water that spills on the floor as soon as it happens. Remove soap buildup in the tub or shower regularly. Attach bath mats securely with double-sided non-slip rug tape. Do not have throw rugs and other things on the floor that can make you trip. What can  I do in the bedroom? Use night lights. Make sure that you have a light by your bed that is easy to reach. Do not use any sheets or blankets that are too big for your bed. They should not hang down onto the floor. Have a firm chair that has side arms. You can use this for support while you get dressed. Do not have throw rugs and other things on the floor that can make you trip. What can I do in the kitchen? Clean up any spills right away. Avoid walking on wet floors. Keep items that you use a lot in easy-to-reach places. If you need to reach something above you, use a strong step stool that has a grab bar. Keep electrical cords out of the way. Do not use floor polish or wax that makes floors slippery. If you must use wax, use non-skid floor wax. Do not have throw rugs and other things on the floor that can make you trip. What can I do with my stairs? Do not leave any items on the stairs. Make sure that there are handrails on both sides of the stairs and use them. Fix handrails that are broken or loose. Make sure that handrails are  as long as the stairways. Check any carpeting to make sure that it is firmly attached to the stairs. Fix any carpet that is loose or worn. Avoid having throw rugs at the top or bottom of the stairs. If you do have throw rugs, attach them to the floor with carpet tape. Make sure that you have a light switch at the top of the stairs and the bottom of the stairs. If you do not have them, ask someone to add them for you. What else can I do to help prevent falls? Wear shoes that: Do not have high heels. Have rubber bottoms. Are comfortable and fit you well. Are closed at the toe. Do not wear sandals. If you use a stepladder: Make sure that it is fully opened. Do not climb a closed stepladder. Make sure that both sides of the stepladder are locked into place. Ask someone to hold it for you, if possible. Clearly mark and make sure that you can see: Any grab bars or  handrails. First and last steps. Where the edge of each step is. Use tools that help you move around (mobility aids) if they are needed. These include: Canes. Walkers. Scooters. Crutches. Turn on the lights when you go into a dark area. Replace any light bulbs as soon as they burn out. Set up your furniture so you have a clear path. Avoid moving your furniture around. If any of your floors are uneven, fix them. If there are any pets around you, be aware of where they are. Review your medicines with your doctor. Some medicines can make you feel dizzy. This can increase your chance of falling. Ask your doctor what other things that you can do to help prevent falls. This information is not intended to replace advice given to you by your health care provider. Make sure you discuss any questions you have with your health care provider. Document Released: 01/10/2009 Document Revised: 08/22/2015 Document Reviewed: 04/20/2014 Elsevier Interactive Patient Education  2017 [employer].

## 2022-11-09 DIAGNOSIS — M1812 Unilateral primary osteoarthritis of first carpometacarpal joint, left hand: Secondary | ICD-10-CM | POA: Diagnosis not present

## 2022-11-18 ENCOUNTER — Encounter: Payer: Self-pay | Admitting: Family Medicine

## 2022-11-18 DIAGNOSIS — H9313 Tinnitus, bilateral: Secondary | ICD-10-CM

## 2022-11-20 ENCOUNTER — Other Ambulatory Visit: Payer: Self-pay

## 2022-11-20 ENCOUNTER — Encounter: Payer: Self-pay | Admitting: Family Medicine

## 2022-11-20 DIAGNOSIS — H9313 Tinnitus, bilateral: Secondary | ICD-10-CM

## 2022-11-24 ENCOUNTER — Telehealth: Payer: Self-pay | Admitting: Internal Medicine

## 2022-11-24 NOTE — Telephone Encounter (Signed)
Noted  

## 2022-11-24 NOTE — Telephone Encounter (Signed)
[facility] researched medical records in the system going back 20 years on this patient.  They are copied to me.  I have reviewed them.  No report of any EGD being performed in this this period of time.  Will discuss with him at next office visit. Records to be scanned.

## 2022-11-26 ENCOUNTER — Other Ambulatory Visit: Payer: Self-pay

## 2022-11-26 MED ORDER — HYOSCYAMINE SULFATE 0.125 MG SL SUBL: 0.1250 mg | SUBLINGUAL_TABLET | Freq: Three times a day (TID) | SUBLINGUAL | 2 refills | Status: AC | PRN

## 2022-12-02 ENCOUNTER — Emergency Department (HOSPITAL_COMMUNITY)
Admission: EM | Admit: 2022-12-02 | Discharge: 2022-12-03 | Disposition: A | Discharge status: Home / Self Care | Payer: Medicare HMO | Attending: Emergency Medicine | Admitting: Emergency Medicine

## 2022-12-02 ENCOUNTER — Other Ambulatory Visit: Payer: Self-pay

## 2022-12-02 DIAGNOSIS — H9313 Tinnitus, bilateral: Secondary | ICD-10-CM | POA: Diagnosis not present

## 2022-12-02 DIAGNOSIS — I2510 Atherosclerotic heart disease of native coronary artery without angina pectoris: Secondary | ICD-10-CM | POA: Diagnosis not present

## 2022-12-02 DIAGNOSIS — R9082 White matter disease, unspecified: Secondary | ICD-10-CM | POA: Diagnosis not present

## 2022-12-02 DIAGNOSIS — I6782 Cerebral ischemia: Secondary | ICD-10-CM | POA: Insufficient documentation

## 2022-12-02 DIAGNOSIS — R42 Dizziness and giddiness: Secondary | ICD-10-CM | POA: Diagnosis not present

## 2022-12-02 DIAGNOSIS — I10 Essential (primary) hypertension: Secondary | ICD-10-CM | POA: Diagnosis not present

## 2022-12-02 DIAGNOSIS — Z8546 Personal history of malignant neoplasm of prostate: Secondary | ICD-10-CM | POA: Insufficient documentation

## 2022-12-02 DIAGNOSIS — I447 Left bundle-branch block, unspecified: Secondary | ICD-10-CM | POA: Diagnosis not present

## 2022-12-02 NOTE — ED Triage Notes (Addendum)
Patient reports persistent " buzzing" sound on both ears with occasional dizziness /lightheadedness onset June of this year . No hearing loss or pain . Hypertensive at triage .

## 2022-12-02 NOTE — ED Provider Notes (Signed)
[facility] DEPT [facility] Emergency Department Provider Note MRN:  [id number]  Arrival date & time: 12/03/22     Chief Complaint   Tinnitus (Hypertensive)   History of Present Illness   Troy [name] is a 67 y.o. year-old male with a history of prostate cancer, carotid artery disease presenting to the ED with chief complaint of tinnitus.  2 months of persistent ringing in the ears.  Today with sudden onset dizziness described as a swimmy headed sensation.  Associated with balance issues.  Constant since 7 PM.  No headache, no vision change, no speech change, no trouble swallowing, no pain, no numbness or weakness to the arms or legs.  Review of Systems  A thorough review of systems was obtained and all systems are negative except as noted in the HPI and PMH.   Patient's Health History    Past Medical History:  Diagnosis Date   Allergy    Seasonal   Anxiety    Aortic stenosis 05/19/2018   Mild by echo 04/2018, mean grad   Arthritis    back   Cancer [facility])    prostate cancer   Complication of anesthesia    Diverticula of colon    left side.   GERD (gastroesophageal reflux disease)    food related   Herniated disc    3 herniated discs in neck   History of kidney stones    Hyperlipidemia 09/30/2022   Hypertension    Left bundle branch block    Noted since 2016 on EKG   PONV (postoperative nausea and vomiting)    Seasonal allergies     Past Surgical History:  Procedure Laterality Date   BIOPSY  07/27/2018   Procedure: BIOPSY;  Surgeon: Corbin Ade, MD;  [facility];  Service: Endoscopy;;   COLONOSCOPY  01/09/2008   Dr.Rourk- normal rectum, L sided diverticulum. colonic mucosa and terminal ileum mucosa appeared normal.   COLONOSCOPY  1996   colonic adenomas   COLONOSCOPY  2001   L sided diverticula   COLONOSCOPY N/A 02/12/2014   Dr. Jena Gauss: diverticulosis   COLONOSCOPY N/A 07/27/2018   Procedure: COLONOSCOPY;  Surgeon: Corbin Ade, MD;  Location: [facility];  Service: Endoscopy;  Laterality: N/A;  2:00pm   ENDARTERECTOMY Right 10/21/2020   Procedure: RIGHT CAROTID ENDARTERECTOMY;  Surgeon: Cephus Shelling, MD;  Location: [facility] OR;  Service: Vascular;  Laterality: Right;   HERNIA REPAIR Left 2008   LITHOTRIPSY     x2   NECK SURGERY     PATCH ANGIOPLASTY Right 10/21/2020   Procedure: PATCH ANGIOPLASTY USING Livia Snellen BIOLOGIC PATCH;  Surgeon: Cephus Shelling, MD;  Location: [facility] OR;  Service: Vascular;  Laterality: Right;   ROBOT ASSISTED LAPAROSCOPIC RADICAL PROSTATECTOMY N/A 01/25/2013   Procedure: ROBOTIC ASSISTED LAPAROSCOPIC RADICAL PROSTATECTOMY LEVEL 1;  Surgeon: Milford Cage, MD;  Location: [facility]S;  Service: Urology;  Laterality: N/A;   SPINE SURGERY  2017 - 2018   Neck surgery    Family History  Problem Relation Age of Onset   Dementia Mother    CAD Father    Prostate cancer Father    Cancer Father    Heart disease Father    Hypertension Brother    Hyperlipidemia Brother    Diabetes Brother    Prostate cancer Paternal Uncle    CAD Paternal Aunt    Kidney cancer Paternal Aunt    Cancer Paternal Aunt    Dementia Maternal Grandmother  Colon cancer Neg Hx    Colon polyps Neg Hx     Social History   Socioeconomic History   Marital status: Married    Spouse name: Shivaansh Zhang   Number of children: 2   Years of education: Not on file   Highest education level: 12th grade  Occupational History   Occupation: retired    Comment: Optician, 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[place]   Tobacco Use   Smoking status: Never    Passive exposure: Never   Smokeless tobacco: Never  Vaping Use   Vaping status: Never Used  Substance and Sexual Activity   Alcohol use: No   Drug use: No   Sexual activity: Yes  Other Topics Concern   Not on file  Social History Narrative   Not on file   Social Determinants of Health   Financial Resource Strain: Low Risk  (10/13/2022)   Overall Financial Resource Strain  (CARDIA)    Difficulty of Paying Living Expenses: Not very hard  Food Insecurity: No Food Insecurity (11/06/2022)   Hunger Vital Sign    Worried About Running Out of Food in the Last Year: Never true    Ran Out of Food in the Last Year: Never true  Transportation Needs: No Transportation Needs (11/06/2022)   PRAPARE - Administrator, Civil Service (Medical): No    Lack of Transportation (Non-Medical): No  Physical Activity: Inactive (11/06/2022)   Exercise Vital Sign    Days of Exercise per Week: 0 days    Minutes of Exercise per Session: 0 min  Stress: No Stress Concern Present (11/06/2022)   [employer] of Occupational Health - Occupational Stress Questionnaire    Feeling of Stress : Not at all  Recent Concern: Stress - Stress Concern Present (10/13/2022)   [employer] of Occupational Health - Occupational Stress Questionnaire    Feeling of Stress : To some extent  Social Connections: Socially Integrated (11/06/2022)   Social Connection and Isolation Panel [NHANES]    Frequency of Communication with Friends and Family: More than three times a week    Frequency of Social Gatherings with Friends and Family: More than three times a week    Attends Religious Services: More than 4 times per year    Active Member of [employer] or Organizations: Yes    Attends Engineer, structural: More than 4 times per year    Marital Status: Married  Catering manager Violence: Not At Risk (11/06/2022)   Humiliation, Afraid, Rape, and Kick questionnaire    Fear of Current or Ex-Partner: No    Emotionally Abused: No    Physically Abused: No    Sexually Abused: No     Physical Exam   Vitals:   12/02/22 2257 12/03/22 0145  BP: (!) 163/93 138/83  Pulse: 75 65  Resp: 16 12  Temp: 98.5 F (36.9 C)   SpO2: 100% 100%    CONSTITUTIONAL: Well-appearing, NAD NEURO/PSYCH:  Alert and oriented x 3, normal and symmetric strength and sensation, normal coordination, normal speech, hesitant  gait EYES:  eyes equal and reactive, nystagmus noted ENT/NECK:  no LAD, no JVD CARDIO: Regular rate, well-perfused, normal S1 and S2 PULM:  CTAB no wheezing or rhonchi GI/GU:  non-distended, non-tender MSK/SPINE:  No gross deformities, no edema SKIN:  no rash, atraumatic   *Additional and/or pertinent findings included in MDM below  Diagnostic and Interventional Summary    EKG Interpretation Date/Time:  Thursday December 03 2022 00:16:28 EDT Ventricular Rate:  66 PR  Interval:  205 QRS Duration:  140 QT Interval:  458 QTC Calculation: 480 R Axis:   36  Text Interpretation: Sinus rhythm Left bundle branch block Confirmed by Kennis Carina [phone]) on 12/03/2022 12:17:35 AM       Labs Reviewed  PROTIME-INR - Abnormal; Notable for the following components:      Result Value   Prothrombin Time 16.0 (*)    INR 1.3 (*)    All other components within normal limits  COMPREHENSIVE METABOLIC PANEL - Abnormal; Notable for the following components:   Glucose, Bld 144 (*)    BUN 6 (*)    All other components within normal limits  I-STAT CHEM 8, ED - Abnormal; Notable for the following components:   BUN 6 (*)    Glucose, Bld 138 (*)    All other components within normal limits  ETHANOL  APTT  CBC  DIFFERENTIAL    MR BRAIN WO CONTRAST  Final Result      Medications - No data to display   Procedures  /  Critical Care Procedures  ED Course and Medical Decision Making  Initial Impression and Ddx Question acute ischemic stroke such as cerebellar stroke or posterior circulation insufficiency.  Could also be inner ear related.  Past medical/surgical history that increases complexity of ED encounter: Carotid artery disease  Interpretation of Diagnostics I personally reviewed the EKG and my interpretation is as follows: Sinus rhythm, left bundle branch block  Labs reassuring with no significant blood count or electrolyte disturbance.  MRI is without evidence of acute stroke.   CTA was initially ordered with the thought that it would be obtained faster than MRI however MRI was quickly obtained.  With no stroke on MRI, CT imaging was deemed unnecessary at this time.  Patient Reassessment and Ultimate Disposition/Management     Patient feels better at this time without intervention.  Suspect vestibular cause of patient's symptoms, he has follow-up with ENT.  Appropriate for discharge.  Patient management required discussion with the following services or consulting groups:  None  Complexity of Problems Addressed Acute illness or injury that poses threat of life of bodily function  Additional Data Reviewed and Analyzed Further history obtained from: Further history from spouse/family member  Additional Factors Impacting ED Encounter Risk Prescriptions  Elmer Sow. Pilar Plate, MD [facility] [email or web address]  Final Clinical Impressions(s) / ED Diagnoses     ICD-10-CM   1. Tinnitus of both ears  H93.13     2. Vertigo  R42       ED Discharge Orders          Ordered    meclizine (ANTIVERT) 25 MG tablet  3 times daily PRN        12/03/22 0247             Discharge Instructions Discussed with and Provided to Patient:     Discharge Instructions      You were evaluated in the Emergency Department and after careful evaluation, we did not find any emergent condition requiring admission or further testing in the hospital.  Your exam/testing today is overall reassuring.  MRI did not show signs of stroke.  Recommend keeping your follow-up with ENT.  Can use the meclizine up to 3 times daily as needed for symptoms.  Please return to the Emergency Department if you experience any worsening of your condition.   Thank you for allowing [place] to be a part of your care.  Sabas Sous, MD 12/03/22 [phone]

## 2022-12-03 ENCOUNTER — Emergency Department (HOSPITAL_COMMUNITY): Payer: Medicare HMO

## 2022-12-03 DIAGNOSIS — I6782 Cerebral ischemia: Secondary | ICD-10-CM | POA: Diagnosis not present

## 2022-12-03 LAB — COMPREHENSIVE METABOLIC PANEL
ALT: 32 U/L (ref 0–44)
AST: 28 U/L (ref 15–41)
Albumin: 4 g/dL (ref 3.5–5.0)
Alkaline Phosphatase: 78 U/L (ref 38–126)
Anion gap: 10 (ref 5–15)
BUN: 6 mg/dL — ABNORMAL LOW (ref 8–23)
CO2: 26 mmol/L (ref 22–32)
Calcium: 8.9 mg/dL (ref 8.9–10.3)
Chloride: 104 mmol/L (ref 98–111)
Creatinine, Ser: 1.01 mg/dL (ref 0.61–1.24)
GFR, Estimated: >60 mL/min (ref >60)
Glucose, Bld: 144 mg/dL — ABNORMAL HIGH (ref 70–99)
Potassium: 3.5 mmol/L (ref 3.5–5.1)
Sodium: 140 mmol/L (ref 135–145)
Total Bilirubin: 0.9 mg/dL (ref 0.3–1.2)
Total Protein: 6.8 g/dL (ref 6.5–8.1)

## 2022-12-03 LAB — I-STAT CHEM 8, ED
BUN: 6 mg/dL — ABNORMAL LOW (ref 8–23)
Calcium, Ion: 1.17 mmol/L (ref 1.15–1.40)
Chloride: 103 mmol/L (ref 98–111)
Creatinine, Ser: 1 mg/dL (ref 0.61–1.24)
Glucose, Bld: 138 mg/dL — ABNORMAL HIGH (ref 70–99)
HCT: 43 % (ref 39.0–52.0)
Hemoglobin: 14.6 g/dL (ref 13.0–17.0)
Potassium: 3.5 mmol/L (ref 3.5–5.1)
Sodium: 141 mmol/L (ref 135–145)
TCO2: 23 mmol/L (ref 22–32)

## 2022-12-03 LAB — CBC
HCT: 43 % (ref 39.0–52.0)
Hemoglobin: 14.7 g/dL (ref 13.0–17.0)
MCH: 30.9 pg (ref 26.0–34.0)
MCHC: 34.2 g/dL (ref 30.0–36.0)
MCV: 90.3 fL (ref 80.0–100.0)
Platelets: 216 10*3/uL (ref 150–400)
RBC: 4.76 MIL/uL (ref 4.22–5.81)
RDW: 12.7 % (ref 11.5–15.5)
WBC: 5.1 10*3/uL (ref 4.0–10.5)
nRBC: 0 % (ref 0.0–0.2)

## 2022-12-03 LAB — DIFFERENTIAL
Abs Immature Granulocytes: 0.01 10*3/uL (ref 0.00–0.07)
Basophils Absolute: 0.1 10*3/uL (ref 0.0–0.1)
Basophils Relative: 1 %
Eosinophils Absolute: 0.2 10*3/uL (ref 0.0–0.5)
Eosinophils Relative: 3 %
Immature Granulocytes: 0 %
Lymphocytes Relative: 34 %
Lymphs Abs: 1.7 10*3/uL (ref 0.7–4.0)
Monocytes Absolute: 0.5 10*3/uL (ref 0.1–1.0)
Monocytes Relative: 10 %
Neutro Abs: 2.6 10*3/uL (ref 1.7–7.7)
Neutrophils Relative %: 52 %

## 2022-12-03 LAB — ETHANOL: Alcohol, Ethyl (B): <10 mg/dL (ref <10)

## 2022-12-03 LAB — APTT: aPTT: 33 s (ref 24–36)

## 2022-12-03 LAB — PROTIME-INR
INR: 1.3 — ABNORMAL HIGH (ref 0.8–1.2)
Prothrombin Time: 16 s — ABNORMAL HIGH (ref 11.4–15.2)

## 2022-12-03 MED ORDER — MECLIZINE HCL 25 MG PO TABS: 25.0000 mg | ORAL_TABLET | Freq: Three times a day (TID) | ORAL | 0 refills | Status: DC | PRN

## 2022-12-03 NOTE — Discharge Instructions (Signed)
You were evaluated in the Emergency Department and after careful evaluation, we did not find any emergent condition requiring admission or further testing in the hospital.  Your exam/testing today is overall reassuring.  MRI did not show signs of stroke.  Recommend keeping your follow-up with ENT.  Can use the meclizine up to 3 times daily as needed for symptoms.  Please return to the Emergency Department if you experience any worsening of your condition.   Thank you for allowing [place] to be a part of your care.

## 2022-12-03 NOTE — ED Notes (Signed)
Was noted that patient had CTA ordered but no documented IV. Went to start IV and found patient to have had an IV started previously, but was not documented.

## 2022-12-16 NOTE — Progress Notes (Signed)
Troy [name] M. [name], MD [place] [facility] [email or web address]  

## 2022-12-24 ENCOUNTER — Ambulatory Visit (INDEPENDENT_AMBULATORY_CARE_PROVIDER_SITE_OTHER): Payer: Medicare HMO | Admitting: Otolaryngology

## 2022-12-24 ENCOUNTER — Encounter (INDEPENDENT_AMBULATORY_CARE_PROVIDER_SITE_OTHER): Payer: Self-pay | Admitting: Otolaryngology

## 2022-12-24 VITALS — BP 156/84 | HR 84 | Ht 69.0 in | Wt 177.2 lb

## 2022-12-24 DIAGNOSIS — H9313 Tinnitus, bilateral: Secondary | ICD-10-CM

## 2022-12-24 DIAGNOSIS — R2689 Other abnormalities of gait and mobility: Secondary | ICD-10-CM

## 2022-12-24 NOTE — Progress Notes (Signed)
Dear Dr. Swaziland, Here is my assessment for our mutual patient, Brinden Kincheloe. Thank you for allowing me the opportunity to care for your patient. Please do not hesitate to contact me should you have any other questions. Sincerely, Dr. Jovita Kussmaul  Otolaryngology Clinic Note Referring provider: Dr. Swaziland HPI:  Troy [name] is a 67 y.o. male kindly referred by Dr. Swaziland for evaluation of bilateral tinnitus.Started in June, then saw PCP, did not find anything. Went to ED due to acute onset issues with balance with brief episode of what he describes as vertigo in Sept 2024, did MRI. No hearing loss fullness, or other symptoms at that time. He already had tinnitus at that point. No autophony, headaches, or other otologic or neurologic symptoms. Dizziness resolved in a couple of hours but still with some off balance - especially when he gets up. He does report his blood pressure has been elevated recently at checks. No hearing loss including, pain, drainage. Low-pitched, humming. No antecedent event. No hearing change. Constant since June.   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   No prior problems, surgeries, barotrauma, not on vestibular suppressants (only took one dose prior).  No history of ear problems or ear surgery  BP 170s today, reports in 120s.   Meds - he is on ASA 81  MRI 12/03/2022 for off balance: no obvious strokes identified. Per my review, mastoids well aerated, no obvious CPA lesions on T2 (see below); right CN7/8 wider than left but no lesions noted.   PCP notes:   PSHx: Carotid endartectomy (2022, on ASA), HTN, HLD, Prostate Cancer, GERD, Neck Pain (s/p posterior neck surgery), history of Left bundle branch block; no diabetes  PMH/Meds/All/SocHx/FamHx/ROS:   Past Medical History:  Diagnosis Date   Allergy    Seasonal   Anxiety    Aortic stenosis 05/19/2018   Mild by echo 04/2018, mean grad   Arthritis    back    Cancer [facility])    prostate cancer   Complication of anesthesia    Diverticula of colon    left side.   GERD (gastroesophageal reflux disease)    food related   Herniated disc    3 herniated discs in neck   History of kidney stones    Hyperlipidemia 09/30/2022   Hypertension    Left bundle branch block    Noted since 2016 on EKG   PONV (postoperative nausea and vomiting)    Seasonal allergies     Past Surgical History:  Procedure Laterality Date   BIOPSY  07/27/2018   Procedure: BIOPSY;  Surgeon: Corbin Ade, MD;  [facility];  Service: Endoscopy;;   COLONOSCOPY  01/09/2008   Dr.Rourk- normal rectum, L sided diverticulum. colonic mucosa and terminal ileum mucosa appeared normal.   COLONOSCOPY  1996   colonic adenomas   COLONOSCOPY  2001   L sided diverticula   COLONOSCOPY N/A 02/12/2014   Dr. Jena Gauss: diverticulosis   COLONOSCOPY N/A 07/27/2018   Procedure: COLONOSCOPY;  Surgeon: Corbin Ade, MD;  [facility];  Service: Endoscopy;  Laterality: N/A;  2:00pm   ENDARTERECTOMY Right 10/21/2020   Procedure: RIGHT CAROTID ENDARTERECTOMY;  Surgeon: Cephus Shelling, MD;  Location: [facility] OR;  Service: Vascular;  Laterality: Right;   HERNIA REPAIR Left 2008   LITHOTRIPSY     x2   NECK SURGERY     PATCH ANGIOPLASTY Right 10/21/2020   Procedure: PATCH ANGIOPLASTY USING  Livia Snellen BIOLOGIC PATCH;  Surgeon: Cephus Shelling, MD;  Location: [facility] OR;  Service: Vascular;  Laterality: Right;   ROBOT ASSISTED LAPAROSCOPIC RADICAL PROSTATECTOMY N/A 01/25/2013   Procedure: ROBOTIC ASSISTED LAPAROSCOPIC RADICAL PROSTATECTOMY LEVEL 1;  Surgeon: Milford Cage, MD;  Location: [facility];  Service: Urology;  Laterality: N/A;   SPINE SURGERY  2017 - 2018   Neck surgery    Family History  Problem Relation Age of Onset   Dementia Mother    CAD Father    Prostate cancer Father    Cancer Father    Heart disease Father    Hypertension Brother    Hyperlipidemia  Brother    Diabetes Brother    Prostate cancer Paternal Uncle    CAD Paternal Aunt    Kidney cancer Paternal Aunt    Cancer Paternal Aunt    Dementia Maternal Grandmother    Colon cancer Neg Hx    Colon polyps Neg Hx    No family history of bleeding disorders or difficulty with anesthesia  Social Connections: Socially Integrated (11/06/2022)   Social Connection and Isolation Panel [NHANES]    Frequency of Communication with Friends and Family: More than three times a week    Frequency of Social Gatherings with Friends and Family: More than three times a week    Attends Religious Services: More than 4 times per year    Active Member of Clubs or Organizations: Yes    Attends Engineer, structural: More than 4 times per year    Marital Status: Married      Current Outpatient Medications:    ascorbic acid (VITAMIN C) 500 MG tablet, Take 500 mg by mouth daily. Takes 2x/week, Disp: , Rfl:    aspirin EC 81 MG tablet, Take 81 mg by mouth daily. Swallow whole., Disp: , Rfl:    atorvastatin (LIPITOR) 40 MG tablet, Take 1 tablet (40 mg total) by mouth daily., Disp: 30 tablet, Rfl:    cetirizine (ZYRTEC) 10 MG tablet, Take 10 mg by mouth as needed for allergies., Disp: , Rfl:    clonazePAM (KLONOPIN) 0.5 MG tablet, Take 0.5 mg by mouth 2 (two) times daily as needed for anxiety., Disp: , Rfl:    esomeprazole (NEXIUM) 40 MG capsule, Take 40 mg by mouth daily., Disp: , Rfl:    hyoscyamine (LEVSIN SL) 0.125 MG SL tablet, Take 1 tablet (0.125 mg total) by mouth every 8 (eight) hours as needed., Disp: 30 tablet, Rfl: 2   ibuprofen (ADVIL) 200 MG tablet, Take 200 mg by mouth as needed., Disp: , Rfl:    losartan (COZAAR) 25 MG tablet, Take 25 mg by mouth daily. , Disp: , Rfl:    meclizine (ANTIVERT) 25 MG tablet, Take 1 tablet (25 mg total) by mouth 3 (three) times daily as needed for dizziness., Disp: 30 tablet, Rfl: 0   Polyethyl Glycol-Propyl Glycol (SYSTANE OP), Place 1 drop into both eyes  daily., Disp: , Rfl:    psyllium (METAMUCIL) 58.6 % packet, Take 1 packet by mouth daily., Disp: , Rfl:    tadalafil (CIALIS) 5 MG tablet, Take 5 mg by mouth as needed for erectile dysfunction., Disp: , Rfl:   A 10-point ROS was performed with pertinent positives/negatives noted in the HPI.    Physical Exam:   BP (!) 156/84 (BP Location: Right Arm, Patient Position: Sitting, Cuff Size: Normal)   Pulse 84   Ht 5\' 9"  (1.753 m)   Wt 177 lb 3.2 oz (80.4 kg)  SpO2 97%   BMI 26.17 kg/m   Salient findings:  CN II-XII intact Bilateral EAC clear and TM intact with well pneumatized middle ear spaces; no objective tinnitus on auscultation; no nystagmus grossly Weber 512: midline Rinne 512: AC > BC b/l  Rine 1024: AC > BC b/l  Anterior rhinoscopy: Septum relatively midline; bilateral inferior turbinates without significant hypertrophy No lesions of oral cavity/oropharynx No obviously palpable neck masses/lymphadenopathy/thyromegaly; neck incision on right well healed No respiratory distress or stridor  Independent Review of Additional Tests or Records:  PCP notes and hearing screen (pass) reviewed independently and summarized above MRI reviewed independently and summarized above  Procedures:  None  Impression & Plans:  Troy [name] is a 67 y.o. male with history of HTN and carotid endarterectomy in 2022 referred by Dr. Swaziland for evaluation of bilateral non-pulsatile tinnitus without significant HL.  He does not have any dizziness, but what sounds like postural lightheadedness based on history. Exam today is unremarkable and he does not have vertigo or other ear symptoms (or history of them beside one brief episode - see above). MRI did not show any obvious lesions per my review though not an MRI IAC (see image above). He does have history of noise exposure in the past.  Bilateral tinnitus - non-puslatile; no other associated symptoms - discussed masking; he has not had an audio; may be  worth getting to r/o presbycusis contributing to this and check WRT for any asymmetries - MRI as above; don't feel strongly about any further imaging workup given bilateral and lack of other syjmptoms  2. Feeling off balance - does not appear to be ear related - Given his cardiac/vascular history and recent BP elevation, wonder if cardiac/orthostatic cause may be playing a role - he will contact his PCP for this for workup  - f/u in 8 weeks after audio  Thank you for allowing me the opportunity to care for your patient. Please do not hesitate to contact me should you have any other questions.  Sincerely, Jovita Kussmaul, MD Otolarynoglogist (ENT), [facility] Phone: [phone] Fax: [phone]  12/25/2022, 9:21 AM

## 2022-12-26 ENCOUNTER — Other Ambulatory Visit: Payer: Self-pay | Admitting: Family Medicine

## 2023-01-01 ENCOUNTER — Ambulatory Visit: Payer: Medicare HMO

## 2023-01-01 ENCOUNTER — Ambulatory Visit (INDEPENDENT_AMBULATORY_CARE_PROVIDER_SITE_OTHER): Payer: Medicare HMO | Admitting: Family Medicine

## 2023-01-01 ENCOUNTER — Encounter: Payer: Self-pay | Admitting: Family Medicine

## 2023-01-01 VITALS — BP 134/80 | HR 76 | Temp 98.1°F | Resp 16 | Ht 69.0 in | Wt 179.2 lb

## 2023-01-01 DIAGNOSIS — R42 Dizziness and giddiness: Secondary | ICD-10-CM | POA: Diagnosis not present

## 2023-01-01 DIAGNOSIS — Z23 Encounter for immunization: Secondary | ICD-10-CM | POA: Diagnosis not present

## 2023-01-01 DIAGNOSIS — I10 Essential (primary) hypertension: Secondary | ICD-10-CM | POA: Diagnosis not present

## 2023-01-01 DIAGNOSIS — R7309 Other abnormal glucose: Secondary | ICD-10-CM | POA: Diagnosis not present

## 2023-01-01 LAB — POCT GLYCOSYLATED HEMOGLOBIN (HGB A1C): Hemoglobin A1C: 5.6 % (ref 4.0–5.6)

## 2023-01-01 NOTE — Progress Notes (Signed)
ACUTE VISIT Chief Complaint  Patient presents with   Dizziness    X a month, loss of balance. Happens when he stands up   HPI: [name] is a 67 y.o. male  with a PMHx significant for anxiety, PAD, HTN, and HLD, who is here today complaining of lightheadedness and loss of balance.   Last seen on 10/14/2022 for tinnitus in both ears. He followed with ENT on 12/24/22.  He complains of lightheadedness when he stands up for the last month. He says this happens 2-3 times per day, and lasts for 10-15 seconds.  He endorses associated balance loss.   Dizziness This is a new problem. The problem has been unchanged. Associated symptoms include vertigo. Pertinent negatives include no abdominal pain, arthralgias, change in bowel habit, chest pain, chills, congestion, coughing, fever, headaches, myalgias, nausea, numbness, rash, sore throat, swollen glands, urinary symptoms, visual change or vomiting. The symptoms are aggravated by standing. He has tried nothing for the symptoms.   He denies dizziness or vertigo like sxs while he is in bed. No associated headache,chest pain, SOB, palpitations, or diaphoresis. No recent diet or med changes.  Drinking fluids and eating normal.  He has not tried anything OTC.  He mentions he was seen in the ER on 9/5 for vertigo, but has not had vertigo since.  Lab Results  Component Value Date   NA 141 12/03/2022   CL 103 12/03/2022   K 3.5 12/03/2022   CO2 26 12/03/2022   BUN 6 (L) 12/03/2022   CREATININE 1.00 12/03/2022   GFRNONAA >60 12/03/2022   CALCIUM 8.9 12/03/2022   PHOS 4.1 05/18/2018   ALBUMIN 4.0 12/03/2022   GLUCOSE 138 (H) 12/03/2022   Lab Results  Component Value Date   WBC 5.1 12/03/2022   HGB 14.6 12/03/2022   HCT 43.0 12/03/2022   MCV 90.3 12/03/2022   PLT 216 12/03/2022   Lab Results  Component Value Date   ALT 32 12/03/2022   AST 28 12/03/2022   ALKPHOS 78 12/03/2022   BILITOT 0.9 12/03/2022   Brain MRI:  1. No  acute intracranial abnormality. 2. Mild chronic microvascular ischemic disease for age.  Hypertension:  Medications: He is currently taking losartan 25 mg daily. BP readings at home: He has been checking at home. Readings have been 130s-140s/80s.  Side effects: none Negative for unusual or severe headache, visual changes, exertional chest pain, focal weakness, or edema.  Lab Results  Component Value Date   NA 141 12/03/2022   CL 103 12/03/2022   K 3.5 12/03/2022   CO2 26 12/03/2022   BUN 6 (L) 12/03/2022   CREATININE 1.00 12/03/2022   GFRNONAA >60 12/03/2022   CALCIUM 8.9 12/03/2022   PHOS 4.1 05/18/2018   ALBUMIN 4.0 12/03/2022   GLUCOSE 138 (H) 12/03/2022   Elevated glucose, [phone]. No hx of diabetes. Negative for polydipsia,polyuria, or polyphagia.  Review of Systems  Constitutional:  Negative for chills and fever.  HENT:  Negative for congestion, nosebleeds and sore throat.   Respiratory:  Negative for cough and wheezing.   Cardiovascular:  Negative for chest pain.  Gastrointestinal:  Negative for abdominal pain, change in bowel habit, nausea and vomiting.  Genitourinary:  Negative for decreased urine volume, dysuria and hematuria.  Musculoskeletal:  Negative for arthralgias and myalgias.  Skin:  Negative for rash.  Neurological:  Positive for dizziness and vertigo. Negative for syncope, facial asymmetry, numbness and headaches.  Psychiatric/Behavioral:  Negative for confusion and hallucinations.  See other pertinent positives and negatives in HPI.  Current Outpatient Medications on File Prior to Visit  Medication Sig Dispense Refill   ascorbic acid (VITAMIN C) 500 MG tablet Take 500 mg by mouth daily. Takes 2x/week     aspirin EC 81 MG tablet Take 81 mg by mouth daily. Swallow whole.     atorvastatin (LIPITOR) 40 MG tablet Take 1 tablet (40 mg total) by mouth daily. 30 tablet    cetirizine (ZYRTEC) 10 MG tablet Take 10 mg by mouth as needed for allergies.      clonazePAM (KLONOPIN) 0.5 MG tablet Take 0.5 mg by mouth 2 (two) times daily as needed for anxiety.     esomeprazole (NEXIUM) 40 MG capsule Take 40 mg by mouth daily.     hyoscyamine (LEVSIN SL) 0.125 MG SL tablet Take 1 tablet (0.125 mg total) by mouth every 8 (eight) hours as needed. 30 tablet 2   ibuprofen (ADVIL) 200 MG tablet Take 200 mg by mouth as needed.     losartan (COZAAR) 25 MG tablet TAKE ONE TABLET EACH DAY FOR HIGH BLOOD PRESSURE. 90 tablet 3   meclizine (ANTIVERT) 25 MG tablet Take 1 tablet (25 mg total) by mouth 3 (three) times daily as needed for dizziness. 30 tablet 0   Polyethyl Glycol-Propyl Glycol (SYSTANE OP) Place 1 drop into both eyes daily.     psyllium (METAMUCIL) 58.6 % packet Take 1 packet by mouth daily.     tadalafil (CIALIS) 5 MG tablet Take 5 mg by mouth as needed for erectile dysfunction.     No current facility-administered medications on file prior to visit.    Past Medical History:  Diagnosis Date   Allergy    Seasonal   Anxiety    Aortic stenosis 05/19/2018   Mild by echo 04/2018, mean grad   Arthritis    back   Cancer [facility])    prostate cancer   Complication of anesthesia    Diverticula of colon    left side.   GERD (gastroesophageal reflux disease)    food related   Herniated disc    3 herniated discs in neck   History of kidney stones    Hyperlipidemia 09/30/2022   Hypertension    Left bundle branch block    Noted since 2016 on EKG   PONV (postoperative nausea and vomiting)    Seasonal allergies    Allergies  Allergen Reactions   Penicillins Other (See Comments)    Did it involve swelling of the face/tongue/throat, SOB, or low BP? No Did it involve sudden or severe rash/hives, skin peeling, or any reaction on the inside of your mouth or nose? No Did you need to seek medical attention at a hospital or doctor's office? No When did it last happen?      Over 10 years If all above answers are "NO", may proceed with cephalosporin  use.    Numbness around mouth    Ciprofloxacin Rash   Darvocet [Propoxyphene N-Acetaminophen] Rash   Tylenol [Acetaminophen] Rash    Social History   Socioeconomic History   Marital status: Married    Spouse name: Troy [name]   Number of children: 2   Years of education: Not on file   Highest education level: 12th grade  Occupational History   Occupation: retired    Comment: Optician, 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[place]   Tobacco Use   Smoking status: Never    Passive exposure: Never   Smokeless tobacco: Never  Vaping Use  Vaping status: Never Used  Substance and Sexual Activity   Alcohol use: No   Drug use: No   Sexual activity: Yes  Other Topics Concern   Not on file  Social History Narrative   Not on file   Social Determinants of Health   Financial Resource Strain: Low Risk  (10/13/2022)   Overall Financial Resource Strain (CARDIA)    Difficulty of Paying Living Expenses: Not very hard  Food Insecurity: No Food Insecurity (11/06/2022)   Hunger Vital Sign    Worried About Running Out of Food in the Last Year: Never true    Ran Out of Food in the Last Year: Never true  Transportation Needs: No Transportation Needs (11/06/2022)   PRAPARE - Administrator, Civil Service (Medical): No    Lack of Transportation (Non-Medical): No  Physical Activity: Inactive (11/06/2022)   Exercise Vital Sign    Days of Exercise per Week: 0 days    Minutes of Exercise per Session: 0 min  Stress: No Stress Concern Present (11/06/2022)   [employer] of Occupational Health - Occupational Stress Questionnaire    Feeling of Stress : Not at all  Recent Concern: Stress - Stress Concern Present (10/13/2022)   [employer] of Occupational Health - Occupational Stress Questionnaire    Feeling of Stress : To some extent  Social Connections: Socially Integrated (11/06/2022)   Social Connection and Isolation Panel [NHANES]    Frequency of Communication with Friends and Family: More than three  times a week    Frequency of Social Gatherings with Friends and Family: More than three times a week    Attends Religious Services: More than 4 times per year    Active Member of [employer] or Organizations: Yes    Attends Engineer, structural: More than 4 times per year    Marital Status: Married   Vitals:   01/01/23 0942  BP: 134/80  Pulse: 76  Resp: 16  Temp: 98.1 F (36.7 C)  SpO2: 98%  Orthostatic Vitals for the past 48 hrs (Last 6 readings):  Patient Position Orthostatic BP Orthostatic Pulse BP Pulse BP Location Cuff Size  01/01/23 0942 Sitting -- -- 134/80 76 Left Arm Large  01/01/23 1004 Supine 165/85 72 -- -- Right Arm Normal  01/01/23 1005 Sitting 140/85 84 -- -- Right Arm Normal  01/01/23 1006 Standing 160/85 88 -- -- Right Arm Normal  Body mass index is 26.47 kg/m.  Physical Exam Vitals and nursing note reviewed.  Constitutional:      General: He is not in acute distress.    Appearance: He is well-developed.  HENT:     Head: Normocephalic and atraumatic.     Mouth/Throat:     Mouth: Mucous membranes are moist.     Pharynx: Oropharynx is clear.  Eyes:     Conjunctiva/sclera: Conjunctivae normal.  Cardiovascular:     Rate and Rhythm: Normal rate and regular rhythm.     Pulses:          Dorsalis pedis pulses are 2+ on the right side and 2+ on the left side.     Heart sounds: Murmur (SEM I-II/VI LUSB and RUSB) heard.  Pulmonary:     Effort: Pulmonary effort is normal. No respiratory distress.     Breath sounds: Normal breath sounds.  Abdominal:     Palpations: Abdomen is soft. There is no mass.     Hernia: A hernia (easily reduced, not tender.) is present. Hernia is present  in the umbilical area.  Musculoskeletal:     Right lower leg: No edema.     Left lower leg: No edema.  Lymphadenopathy:     Cervical: No cervical adenopathy.  Skin:    General: Skin is warm.     Findings: No erythema or rash.  Neurological:     General: No focal deficit present.      Mental Status: He is alert and oriented to person, place, and time.     Gait: Gait normal.  Psychiatric:        Mood and Affect: Affect normal. Mood is anxious.   ASSESSMENT AND PLAN:  Mr. Vancott was seen today for lightheadedness and loss of balance. Lab Results  Component Value Date   HGBA1C 5.6 01/01/2023   Essential (primary) hypertension Assessment & Plan: Overall adequately controlled today, has had elevated BP's during recent OV's. Reporting home BP's 130-140/80.  For now continue losartan 25 mg daily and low-salt diet. Continue monitoring BP at home.  Lightheadedness  We discussed possible etiologies. Some of his medications and chronic comorbidities can be contributing factors. History does not suggest a serious process. ED work up on 12/03/22 for vertigo with no significant abnormalities, including brain MRI. EKG LBBB, which is chronic. Mildly orthostatic during examination today, when changing from supine to sitting position.  Recommend adequate hydration and to move slow, resting for a few seconds before position changes, and fall prevention. Instructed about warning signs.  Elevated glucose Normal HgA1C today.  -     POCT glycosylated hemoglobin (Hb A1C)  Need for influenza vaccination -     Flu vaccine trivalent PF, 6mos and older(Flulaval,Afluria,Fluarix,Fluzone)  Return if symptoms worsen or fail to improve, for keep next appointment.  I, Rolla Etienne Wierda, acting as a scribe for Barbette Mcglaun [place], MD., have documented all relevant documentation on the behalf of Troy Mantell [place], MD, as directed by  Troy Eisenhardt [place], MD while in the presence of Troy Elmore [place], MD.   I, Arshdeep Bolger [place], MD, have reviewed all documentation for this visit. The documentation on 01/01/23 for the exam, diagnosis, procedures, and orders are all accurate and complete.  Troy Manternach G. [place], MD  [facility]. [place] office.

## 2023-01-01 NOTE — Assessment & Plan Note (Signed)
Overall adequately controlled today, has had elevated BP's during recent OV's. Reporting home BP's 130-140/80.  For now continue losartan 25 mg daily and low-salt diet. Continue monitoring BP at home.

## 2023-01-01 NOTE — Patient Instructions (Addendum)
A few things to remember from today's visit:  Essential (primary) hypertension  Lightheadedness  Elevated glucose  No changes today. Continue monitoring blood pressures. Move slowly.  If you need refills for medications you take chronically, please call your pharmacy. Do not use My Chart to request refills or for acute issues that need immediate attention. If you send a my chart message, it may take a few days to be addressed, specially if I am not in the office.  Please be sure medication list is accurate. If a new problem present, please set up appointment sooner than planned today.

## 2023-01-15 ENCOUNTER — Other Ambulatory Visit: Payer: Self-pay | Admitting: Family Medicine

## 2023-01-18 ENCOUNTER — Ambulatory Visit: Payer: Medicare HMO | Admitting: Family Medicine

## 2023-01-18 ENCOUNTER — Encounter: Payer: Self-pay | Admitting: Family Medicine

## 2023-01-18 VITALS — BP 122/80 | HR 82 | Resp 16 | Ht 69.0 in | Wt 178.1 lb

## 2023-01-18 DIAGNOSIS — F419 Anxiety disorder, unspecified: Secondary | ICD-10-CM

## 2023-01-18 DIAGNOSIS — M5442 Lumbago with sciatica, left side: Secondary | ICD-10-CM | POA: Diagnosis not present

## 2023-01-18 MED ORDER — SERTRALINE HCL 25 MG PO TABS
25.0000 mg | ORAL_TABLET | Freq: Every day | ORAL | 2 refills | Status: DC
Start: 2023-01-18 — End: 2023-02-03

## 2023-01-18 MED ORDER — PREDNISONE 20 MG PO TABS
ORAL_TABLET | ORAL | 0 refills | Status: DC
Start: 2023-01-18 — End: 2023-03-02

## 2023-01-18 NOTE — Telephone Encounter (Signed)
He follows with GI, most likely it was prescribed there. Thanks, BJ

## 2023-01-18 NOTE — Progress Notes (Signed)
ACUTE VISIT Chief Complaint  Patient presents with   Anxiety   HPI: [name] is a 67 y.o. male with a PMHx significant for anxiety, PAD, HTN, and HLD, who is here today for anxiety and back pain.   HPI  Back Pain: Patient complains of low back pain for the last three weeks. The pain started when he was loading a barrel into his pickup truck. He describes the pain as a sharp pain that has not improved or worsened since the incident, and rates it as a 7/10. He says it is worsened by standing for long periods. About a week later, he began experiencing radiation to his left leg.  He denies new numbness or tingling, or urinary or bowel incontinence.  He has been taking 4 Advil per day, which has not helped much.  He notes he has had similar instances of back pain before.  He has taken prednisone for back pain before.  Anxiety: He also complains that his clonazepam has not been helping his anxiety for a while. He also endorses occasional sadness and depression for several months.  He has never seen a psychiatrist. He notes he has been sleeping fairly well, but occasionally wakes up at night.  He says he has tried diazepam before, but never sertraline or Lexapro.  He denies any recent events that would aggravate his anxiety or cause depressive symptoms.     01/18/2023   11:48 AM 11/06/2022    1:13 PM 09/30/2022   10:57 AM 11/12/2020   10:21 AM  Depression screen PHQ 2/9  Decreased Interest 1 0 0 0  Down, Depressed, Hopeless 1 0 0 0  PHQ - 2 Score 2 0 0 0  Altered sleeping 1     Tired, decreased energy 1     Change in appetite 0     Feeling bad or failure about yourself  1     Trouble concentrating 1     Moving slowly or fidgety/restless 0     Suicidal thoughts 0     PHQ-9 Score 6     Difficult doing work/chores Somewhat difficult         01/18/2023   11:49 AM  GAD 7 : Generalized Anxiety Score  Nervous, Anxious, on Edge 1  Control/stop worrying 1  Worry too much -  different things 1  Trouble relaxing 1  Restless 0  Easily annoyed or irritable 1  Afraid - awful might happen 1  Total GAD 7 Score 6  Anxiety Difficulty Somewhat difficult   Review of Systems See other pertinent positives and negatives in HPI.  Current Outpatient Medications on File Prior to Visit  Medication Sig Dispense Refill   ascorbic acid (VITAMIN C) 500 MG tablet Take 500 mg by mouth daily. Takes 2x/week     aspirin EC 81 MG tablet Take 81 mg by mouth daily. Swallow whole.     atorvastatin (LIPITOR) 40 MG tablet Take 1 tablet (40 mg total) by mouth daily. 30 tablet    cetirizine (ZYRTEC) 10 MG tablet Take 10 mg by mouth as needed for allergies.     clonazePAM (KLONOPIN) 0.5 MG tablet Take 0.5 mg by mouth 2 (two) times daily as needed for anxiety.     esomeprazole (NEXIUM) 40 MG capsule Take 40 mg by mouth daily.     hyoscyamine (LEVSIN SL) 0.125 MG SL tablet Take 1 tablet (0.125 mg total) by mouth every 8 (eight) hours as needed. 30 tablet 2  ibuprofen (ADVIL) 200 MG tablet Take 200 mg by mouth as needed.     losartan (COZAAR) 25 MG tablet TAKE ONE TABLET EACH DAY FOR HIGH BLOOD PRESSURE. 90 tablet 3   Polyethyl Glycol-Propyl Glycol (SYSTANE OP) Place 1 drop into both eyes daily.     psyllium (METAMUCIL) 58.6 % packet Take 1 packet by mouth daily.     tadalafil (CIALIS) 5 MG tablet Take 5 mg by mouth as needed for erectile dysfunction.     No current facility-administered medications on file prior to visit.    Past Medical History:  Diagnosis Date   Allergy    Seasonal   Anxiety    Aortic stenosis 05/19/2018   Mild by echo 04/2018, mean grad   Arthritis    back   Cancer [facility])    prostate cancer   Complication of anesthesia    Diverticula of colon    left side.   GERD (gastroesophageal reflux disease)    food related   Herniated disc    3 herniated discs in neck   History of kidney stones    Hyperlipidemia 09/30/2022   Hypertension    Left bundle branch  block    Noted since 2016 on EKG   PONV (postoperative nausea and vomiting)    Seasonal allergies    Allergies  Allergen Reactions   Penicillins Other (See Comments)    Did it involve swelling of the face/tongue/throat, SOB, or low BP? No Did it involve sudden or severe rash/hives, skin peeling, or any reaction on the inside of your mouth or nose? No Did you need to seek medical attention at a hospital or doctor's office? No When did it last happen?      Over 10 years If all above answers are "NO", may proceed with cephalosporin use.    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[place]   Tobacco Use   Smoking status: Never    Passive exposure: Never   Smokeless tobacco: Never  Vaping Use   Vaping status: Never Used  Substance and Sexual Activity   Alcohol use: No   Drug use: No   Sexual activity: Yes  Other Topics Concern   Not on file  Social History Narrative   Not on file   Social Determinants of Health   Financial Resource Strain: Low Risk  (01/14/2023)   Overall Financial Resource Strain (CARDIA)    Difficulty of Paying Living Expenses: Not very hard  Food Insecurity: No Food Insecurity (01/14/2023)   Hunger Vital Sign    Worried About Running Out of Food in the Last Year: Never true    Ran Out of Food in the Last Year: Never true  Transportation Needs: No Transportation Needs (01/14/2023)   PRAPARE - Administrator, Civil Service (Medical): No    Lack of Transportation (Non-Medical): No  Physical Activity: Inactive (01/14/2023)   Exercise Vital Sign    Days of Exercise per Week: 0 days     Minutes of Exercise per Session: 0 min  Stress: Stress Concern Present (01/14/2023)   [employer] of  Occupational Health - Occupational Stress Questionnaire    Feeling of Stress : To some extent  Social Connections: Moderately Integrated (01/14/2023)   Social Connection and Isolation Panel [NHANES]    Frequency of Communication with Friends and Family: Once a week    Frequency of Social Gatherings with Friends and Family: Once a week    Attends Religious Services: More than 4 times per year    Active Member of [employer] or Organizations: Yes    Attends Banker Meetings: More than 4 times per year    Marital Status: Married    Vitals:   01/18/23 1125  BP: 122/80  Pulse: 82  SpO2: 98%   Body mass index is 26.3 kg/m.  Physical Exam Nursing note reviewed.  Constitutional:      General: He is not in acute distress.    Appearance: He is well-developed.  HENT:     Head: Normocephalic and atraumatic.  Eyes:     Conjunctiva/sclera: Conjunctivae normal.  Cardiovascular:     Rate and Rhythm: Normal rate and regular rhythm.     Pulses:          Posterior tibial pulses are 2+ on the right side and 2+ on the left side.     Heart sounds: Murmur (SEM I-II/VI LUSB and RUSB) heard.  Pulmonary:     Effort: Pulmonary effort is normal. No respiratory distress.     Breath sounds: Normal breath sounds.  Abdominal:     Palpations: Abdomen is soft. There is no hepatomegaly or mass.     Tenderness: There is no abdominal tenderness.  Musculoskeletal:     Thoracic back: Deformity (kyphosis.) present.     Lumbar back: No tenderness. Negative right straight leg raise test and negative left straight leg raise test.     Right lower leg: No edema.     Left lower leg: No edema.     Comments: ***  Lymphadenopathy:     Cervical: No cervical adenopathy.  Skin:    General: Skin is warm.     Findings: No erythema or rash.  Neurological:     Mental Status: He is alert and oriented to  person, place, and time.     Cranial Nerves: No cranial nerve deficit.     Gait: Gait normal.  Psychiatric:     Comments: Well groomed, good eye contact.     ASSESSMENT AND PLAN:  Mr. Dorff was seen today for anxiety and back pain.   There are no diagnoses linked to this encounter.  No follow-ups on file.  I, Suanne Marker, acting as a scribe for Angelynn Lemus [place], MD., have documented all relevant documentation on the behalf of Jhamir Pickup [place], MD, as directed by  Ruie Sendejo [place], MD while in the presence of Benjie Ricketson [place], MD.   I, Suanne Marker, have reviewed all documentation for this visit. The documentation on 01/18/23 for the exam, diagnosis, procedures, and orders are all accurate and complete.  Mills Mitton G. [place], MD  [facility]. [place] office.  Discharge Instructions   None

## 2023-01-18 NOTE — Patient Instructions (Signed)
A few things to remember from today's visit:  Left-sided low back pain with left-sided sciatica, unspecified chronicity - Plan: predniSONE (DELTASONE) 20 MG tablet  Anxiety disorder, unspecified type - Plan: sertraline (ZOLOFT) 25 MG tablet  Take Prednisone with breakfast. Caution with Ibuprofen, do not take it with Prednisone. Today we started Sertrlaine, this type of medications can increase suicidal risk. This is more prevalent among children,adolecents, and young adults with major depression or other psychiatric disorders. It can also make depression worse. Most common side effects are gastrointestinal, self limited after a few weeks: diarrhea, nausea, constipation  Or diarrhea among some.  In general it is well tolerated. We will follow closely.     If you need refills for medications you take chronically, please call your pharmacy. Do not use My Chart to request refills or for acute issues that need immediate attention. If you send a my chart message, it may take a few days to be addressed, specially if I am not in the office.  Please be sure medication list is accurate. If a new problem present, please set up appointment sooner than planned today.

## 2023-01-20 MED ORDER — ESOMEPRAZOLE MAGNESIUM 40 MG PO CPDR: 40.0000 mg | DELAYED_RELEASE_CAPSULE | Freq: Every day | ORAL | 0 refills | Status: DC

## 2023-01-20 NOTE — Telephone Encounter (Signed)
Pt called saying it was his previous PCP that wrote rx esomeprazole (NEXIUM) 40 MG capsule

## 2023-01-20 NOTE — Addendum Note (Signed)
Addended by: Weyman Croon E on: 01/20/2023 11:20 AM   Modules accepted: Orders

## 2023-01-28 ENCOUNTER — Ambulatory Visit: Payer: Medicare HMO | Attending: Otolaryngology | Admitting: Audiology

## 2023-01-28 DIAGNOSIS — H9313 Tinnitus, bilateral: Secondary | ICD-10-CM | POA: Diagnosis not present

## 2023-01-28 NOTE — Procedures (Signed)
Outpatient Audiology [facility] [address] [place], [place]  [address] [phone]  AUDIOLOGICAL  EVALUATION  NAME: Troy [name]     DOB:   September 16, 1955      MRN: [id number]                                                                                     DATE: 01/28/2023     REFERENT: [place], Betty G, MD STATUS: Outpatient DIAGNOSIS: Tinnitus    History: Luisantonio was seen for an audiological evaluation due to tinnitus occurring since June. Kacin reports the tinnitus is constant and the intensity of the tinnitus can change. He reports the tinnitus is in both ears. He reports the tinnitus has become bothersome. Eylan reports an episode of dizziness in September and he was evaluated in the ED and had an MRI completed. The MRI showed no obvious lesions. Theodis reports bilateral aural fullness and denies otalgia and hearing concerns. Lakyn has been evaluated by Dr. Allena Katz, Otolaryngologist at [facility] for his tinnitus.   Evaluation:  Otoscopy showed a clear view of the tympanic membranes, bilaterally Tympanometry results were consistent with normal middle ear pressure and normal tympanic membrane mobility (Type A), bilaterally.  Audiometric testing was completed using Conventional Audiometry techniques with insert earphones and TDH headphones. Test results are consistent in the right ear with normal hearing sensitivity with the exception of a mild hearing loss at 8000 Hz and consistent in the left ear with normal hearing sensitivity with the exception of a mild hearing loss at 6000 to 8000 Hz. Speech Recognition Thresholds were obtained at 15 dB HL in the right ear and at 20 dB HL in the left ear. Word Recognition Testing was completed at 50 dB HL and Jordell scored 100% bilaterally.    Results:  The test results were reviewed with Iantha Fallen.  Today's test results from tympanometry show normal middle ear function in both ears.  Audiometric  responses in the right ear showed normal hearing sensitivity with the exception of a mild hearing loss at 8000 Hz and in the left ear showed normal hearing sensitivity with the exception of a mild hearing loss at 6000 to 8000 Hz.  Hallie may have hearing difficulty in adverse listening situations such as a noisy environment.  He will benefit from the use of good communication strategies.  Ignatuis was counseled regarding his tinnitus and tinnitus management strategies.  Bryley was given a copy of his audiogram today and given handouts on tinnitus management.  Recommendations: 1.   Follow-up with Dr. Allena Katz for further ENT management of his tinnitus. 2.   Monitor hearing in 3 to 4 years  30 minutes spent testing and counseling on results.   If you have any questions please feel free to contact me at [phone].  Marton Redwood Audiologist, Au.D., CCC-A 01/28/2023  9:49 AM  Cc: [place], Betty G, MD

## 2023-02-01 ENCOUNTER — Telehealth: Payer: Self-pay | Admitting: Family Medicine

## 2023-02-01 NOTE — Telephone Encounter (Signed)
Pt states he was started on Zoloft. However, it is making him feel worse. He would like to know if there is another medication he can try. He is requesting a call back.

## 2023-02-03 MED ORDER — BUSPIRONE HCL 5 MG PO TABS: 5.0000 mg | ORAL_TABLET | Freq: Three times a day (TID) | ORAL | 1 refills | Status: DC

## 2023-02-03 NOTE — Telephone Encounter (Signed)
Other options are trying Buspar , starting 5 mg tid and we can titrate up as tolerated OR psychiatry referral. Thanks, BJ

## 2023-02-03 NOTE — Telephone Encounter (Signed)
I called and spoke with patient. He would like to try the Buspar before doing the psychiatry referral. Rx sent in and he will let us know how he is doing with it.

## 2023-02-03 NOTE — Telephone Encounter (Signed)
Pt is aware md back in office today

## 2023-02-18 ENCOUNTER — Encounter (INDEPENDENT_AMBULATORY_CARE_PROVIDER_SITE_OTHER): Payer: Self-pay

## 2023-02-18 ENCOUNTER — Ambulatory Visit (INDEPENDENT_AMBULATORY_CARE_PROVIDER_SITE_OTHER): Payer: Medicare HMO | Admitting: Otolaryngology

## 2023-02-18 VITALS — Ht 69.0 in | Wt 178.0 lb

## 2023-02-18 DIAGNOSIS — I951 Orthostatic hypotension: Secondary | ICD-10-CM

## 2023-02-18 DIAGNOSIS — H9313 Tinnitus, bilateral: Secondary | ICD-10-CM | POA: Diagnosis not present

## 2023-02-18 DIAGNOSIS — H903 Sensorineural hearing loss, bilateral: Secondary | ICD-10-CM

## 2023-02-18 DIAGNOSIS — R42 Dizziness and giddiness: Secondary | ICD-10-CM

## 2023-02-18 NOTE — Progress Notes (Signed)
Dear Dr. [place], Here is my assessment for our mutual patient, Troy [name]. Thank you for allowing me the opportunity to care for your patient. Please do not hesitate to contact me should you have any other questions. Sincerely, Dr. Jovita Kussmaul  Otolaryngology Clinic Note Referring provider: Dr. [place] HPI:  Troy [name] is a 67 y.o. male kindly referred by Dr. [place]and for evaluation of bilateral tinnitus.  Initial visit (2024): Started in June, then saw PCP, did not find anything. Went to ED due to acute onset issues with balance with brief episode of what he describes as vertigo in Sept 2024, did MRI. No hearing loss fullness, or other symptoms at that time. He already had tinnitus at that point. No autophony, headaches, or other otologic or neurologic symptoms. Dizziness resolved in a couple of hours but still with some off balance - especially when he gets up. He does report his blood pressure has been elevated recently at checks. No hearing loss including, pain, drainage. Low-pitched, humming. No antecedent event. No hearing change. Constant since June.   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   No prior problems, surgeries, barotrauma, not on vestibular suppressants (only took one dose prior).  No history of ear problems or ear surgery  BP 170s today, reports in 120s.   Meds - he is on ASA 81  Follow up (01/2023): Returns after audiogram. He reports that his ringing is a bit better, no significant change in heraing.  He went to go see his PCP and his orthostatics were positive, so doing conservative management. He is not having any episodes anymore. No pressure, ear pain, drainage, vertigo. No headaches, no autophony.   He says he is "doing a whole lot better." No imbalance anymore. Tinnitus does not bother him when he sleeps.     PSHx: Carotid endartectomy (2022, on ASA), HTN, HLD, Prostate Cancer, GERD, Neck Pain (s/p posterior  neck surgery), history of Left bundle branch block; no diabetes  PMH/Meds/All/SocHx/FamHx/ROS:   Past Medical History:  Diagnosis Date   Allergy    Seasonal   Anxiety    Aortic stenosis 05/19/2018   Mild by echo 04/2018, mean grad   Arthritis    back   Cancer [facility])    prostate cancer   Complication of anesthesia    Diverticula of colon    left side.   GERD (gastroesophageal reflux disease)    food related   Herniated disc    3 herniated discs in neck   History of kidney stones    Hyperlipidemia 09/30/2022   Hypertension    Left bundle branch block    Noted since 2016 on EKG   PONV (postoperative nausea and vomiting)    Seasonal allergies     Past Surgical History:  Procedure Laterality Date   BIOPSY  07/27/2018   Procedure: BIOPSY;  Surgeon: Corbin Ade, MD;  Location: [facility];  Service: Endoscopy;;   COLONOSCOPY  01/09/2008   Dr.Rourk- normal rectum, L sided diverticulum. colonic mucosa and terminal ileum mucosa appeared normal.   COLONOSCOPY  1996   colonic adenomas   COLONOSCOPY  2001   L sided diverticula   COLONOSCOPY N/A 02/12/2014   Dr. Jena Gauss: diverticulosis   COLONOSCOPY N/A 07/27/2018   Procedure: COLONOSCOPY;  Surgeon: Corbin Ade, MD;  Location: [facility];  Service: Endoscopy;  Laterality: N/A;  2:00pm   ENDARTERECTOMY Right 10/21/2020   Procedure: RIGHT CAROTID ENDARTERECTOMY;  Surgeon: Cephus Shelling, MD;  Location: [facility] OR;  Service: Vascular;  Laterality: Right;   HERNIA REPAIR Left 2008   LITHOTRIPSY     x2   NECK SURGERY     PATCH ANGIOPLASTY Right 10/21/2020   Procedure: PATCH ANGIOPLASTY USING Livia Snellen BIOLOGIC PATCH;  Surgeon: Cephus Shelling, MD;  Location: [facility] OR;  Service: Vascular;  Laterality: Right;   ROBOT ASSISTED LAPAROSCOPIC RADICAL PROSTATECTOMY N/A 01/25/2013   Procedure: ROBOTIC ASSISTED LAPAROSCOPIC RADICAL PROSTATECTOMY LEVEL 1;  Surgeon: Milford Cage, MD;  Location: [facility] ORS;  Service:  Urology;  Laterality: N/A;   SPINE SURGERY  2017 - 2018   Neck surgery    Family History  Problem Relation Age of Onset   Dementia Mother    CAD Father    Prostate cancer Father    Cancer Father    Heart disease Father    Hypertension Brother    Hyperlipidemia Brother    Diabetes Brother    Prostate cancer Paternal Uncle    CAD Paternal Aunt    Kidney cancer Paternal Aunt    Cancer Paternal Aunt    Dementia Maternal Grandmother    Colon cancer Neg Hx    Colon polyps Neg Hx    No family history of bleeding disorders or difficulty with anesthesia  Social Connections: Moderately Integrated (01/14/2023)   Social Connection and Isolation Panel [NHANES]    Frequency of Communication with Friends and Family: Once a week    Frequency of Social Gatherings with Friends and Family: Once a week    Attends Religious Services: More than 4 times per year    Active Member of [employer] or Organizations: Yes    Attends Engineer, structural: More than 4 times per year    Marital Status: Married      Current Outpatient Medications:    ascorbic acid (VITAMIN C) 500 MG tablet, Take 500 mg by mouth daily. Takes 2x/week, Disp: , Rfl:    aspirin EC 81 MG tablet, Take 81 mg by mouth daily. Swallow whole., Disp: , Rfl:    atorvastatin (LIPITOR) 40 MG tablet, Take 1 tablet (40 mg total) by mouth daily., Disp: 30 tablet, Rfl:    busPIRone (BUSPAR) 5 MG tablet, Take 1 tablet (5 mg total) by mouth 3 (three) times daily., Disp: 90 tablet, Rfl: 1   cetirizine (ZYRTEC) 10 MG tablet, Take 10 mg by mouth as needed for allergies., Disp: , Rfl:    esomeprazole (NEXIUM) 40 MG capsule, Take 1 capsule (40 mg total) by mouth daily., Disp: 90 capsule, Rfl: 0   hyoscyamine (LEVSIN SL) 0.125 MG SL tablet, Take 1 tablet (0.125 mg total) by mouth every 8 (eight) hours as needed., Disp: 30 tablet, Rfl: 2   ibuprofen (ADVIL) 200 MG tablet, Take 200 mg by mouth as needed., Disp: , Rfl:    losartan (COZAAR) 25 MG  tablet, TAKE ONE TABLET EACH DAY FOR HIGH BLOOD PRESSURE., Disp: 90 tablet, Rfl: 3   Polyethyl Glycol-Propyl Glycol (SYSTANE OP), Place 1 drop into both eyes daily., Disp: , Rfl:    psyllium (METAMUCIL) 58.6 % packet, Take 1 packet by mouth daily., Disp: , Rfl:    tadalafil (CIALIS) 5 MG tablet, Take 5 mg by mouth as needed for erectile dysfunction., Disp: , Rfl:    clonazePAM (KLONOPIN) 0.5 MG tablet, Take 0.5 mg by mouth 2 (two) times daily as needed for anxiety. (Patient not taking: Reported on 02/18/2023), Disp: , Rfl:    predniSONE (  DELTASONE) 20 MG tablet, 3 tabs for 3 days, 2 tabs for 3 days, 1 tabs for 3 days, and 1/2 tab for 3 days. Take tables together with breakfast. (Patient not taking: Reported on 02/18/2023), Disp: 20 tablet, Rfl: 0  A 10-point ROS was performed with pertinent positives/negatives noted in the HPI.    Physical Exam:   Ht 5\' 9"  (1.753 m)   Wt 178 lb (80.7 kg)   BMI 26.29 kg/m   Salient findings:  CN II-XII intact Bilateral EAC clear and TM intact with well pneumatized middle ear spaces; no objective tinnitus on auscultation; no nystagmus grossly Weber 512: midline Rinne 512: AC > BC b/l  Rine 1024: AC > BC b/l  Anterior rhinoscopy: Septum relatively midline; bilateral inferior turbinates without significant hypertrophy No lesions of oral cavity/oropharynx No obviously palpable neck masses/lymphadenopathy/thyromegaly; neck incision on right well healed No respiratory distress or stridor  Independent Review of Additional Tests or Records:  PCP notes   Independent review of records: MRI 12/03/2022 for off balance: no obvious strokes identified. Per my review, mastoids well aerated, no obvious CPA lesions on T2 (see below); right CN7/8 wider than left but no lesions noted.    01/28/2023 Audiogram was independently reviewed and interpreted by me and I agree with read: Tympanometry results were consistent with normal middle ear pressure and normal tympanic  membrane mobility (Type A), bilaterally.  Audiometric testing was completed using Conventional Audiometry techniques with insert earphones and TDH headphones. Test results are consistent in the right ear with normal hearing sensitivity with the exception of a mild hearing loss at 8000 Hz and consistent in the left ear with normal hearing sensitivity with the exception of a mild hearing loss at 6000 to 8000 Hz. Speech Recognition Thresholds were obtained at 15 dB HL in the right ear and at 20 dB HL in the left ear. Word Recognition Testing was completed at 50 dB HL and Cai scored 100% bilaterally.  SNHL= Sensorineural hearing loss  Procedures:  None  Impression & Plans:  Dillian Vargas is a 67 y.o. male with history of HTN and carotid endarterectomy in 2022 referred by Dr. Sw[place]iland for evaluation of bilateral non-pulsatile tinnitus with mild b/l symmetric SNHL and lightheadedness  Bilateral tinnitus - non-puslatile; no other associated symptoms Bilateral mild SNHL symmetric - confirmed on audio - MRI brain 2024 without obvious large retrocochlear lesion but not IAC imaging - Given symmetry, do not feel strongly re: further workup and he agrees - Discussed tinnitus management strategies and it is getting better, so will try masking - Consider amplification - discussed rpt HT but opted PRN  3. Feeling off balance - does not appear to be ear related - Diagnosed with orthostatic hypotension and doing much better after treatment - No episodes so will hold off on further testing. He's not having frank vertigo  - f/u PRN  Thank you for allowing me the opportunity to care for your patient. Please do not hesitate to contact me should you have any other questions.  Sincerely, Jovita Kussmaul, MD Otolarynoglogist (ENT), [facility] Phone: [phone] Fax: [phone]  02/18/2023, 9:46 AM   MDM:  Level 4: 99214 Complexity/Problems addressed: mod - two chronic problems,  stable Data complexity: mod - independent audiogram review - Morbidity: low  - Prescription Drug prescribed or managed: no

## 2023-03-02 ENCOUNTER — Ambulatory Visit (INDEPENDENT_AMBULATORY_CARE_PROVIDER_SITE_OTHER): Payer: Medicare HMO | Admitting: Family Medicine

## 2023-03-02 ENCOUNTER — Encounter: Payer: Self-pay | Admitting: Family Medicine

## 2023-03-02 VITALS — BP 110/70 | HR 91 | Temp 97.8°F | Resp 16 | Ht 69.0 in | Wt 173.2 lb

## 2023-03-02 DIAGNOSIS — H9313 Tinnitus, bilateral: Secondary | ICD-10-CM | POA: Insufficient documentation

## 2023-03-02 DIAGNOSIS — Z23 Encounter for immunization: Secondary | ICD-10-CM

## 2023-03-02 DIAGNOSIS — F419 Anxiety disorder, unspecified: Secondary | ICD-10-CM | POA: Diagnosis not present

## 2023-03-02 DIAGNOSIS — I10 Essential (primary) hypertension: Secondary | ICD-10-CM | POA: Diagnosis not present

## 2023-03-02 MED ORDER — BUSPIRONE HCL 5 MG PO TABS: 5.0000 mg | ORAL_TABLET | Freq: Three times a day (TID) | ORAL | 1 refills | Status: DC

## 2023-03-02 NOTE — Progress Notes (Signed)
HPI: [name] is a 67 y.o. male with a PMHx significant for anxiety, PAD, HTN, and HLD, who is here today for chronic disease management.  Last seen on 01/18/2023  Anxiety:  Currently on Buspar 5 mg 3x daily. He says the medication has helped significantly. He stopped taking clonazepam 0.5 mg bid prn since his last visit.  He was initially prescribed sertraline after the last visit, but switched to Buspar because he thought it was making his symptoms worse.  Negative for depression like symptoms. He has noted some mild dry mouth since started Buspar.  Back pain:  Last visit he was also c/o low back pain, completed Prednisone taper and reports that pain has resolved.  Tinnitus: He has seen ENT since his last visit for lightheadedness, tinnitus, and hearing loss.  He had a head MRI done on 12/03/22 negative for acute intracranial abnormality, mild chronic microvascular ischemic disease for age.  He also had a hearing test which showed mild hearing loss, decided not to try hearing aids. He says the tinnitus is slightly better since that visit.  No new associated symptoms. He will follow again with ENT if needed.   Hypertension:  Medications: Currently on Losartan 25 mg daily.  BP readings at home: He is checking his BP regularly at home, and says the highest it gets is 140s/80s.  Side effects: none Negative for unusual or severe headache, visual changes, exertional chest pain, dyspnea, focal weakness, or edema.  Lab Results  Component Value Date   CREATININE 1.00 12/03/2022   BUN 6 (L) 12/03/2022   NA 141 12/03/2022   K 3.5 12/03/2022   CL 103 12/03/2022   CO2 26 12/03/2022   Review of Systems  Constitutional:  Negative for activity change, appetite change, chills and fever.  HENT:  Negative for mouth sores, nosebleeds and sore throat.   Respiratory:  Negative for cough and wheezing.   Gastrointestinal:  Negative for abdominal pain, nausea and vomiting.   Genitourinary:  Negative for decreased urine volume and hematuria.  Skin:  Negative for rash.  Neurological:  Negative for syncope and facial asymmetry.  Psychiatric/Behavioral:  Negative for confusion and hallucinations.   See other pertinent positives and negatives in HPI.  Current Outpatient Medications on File Prior to Visit  Medication Sig Dispense Refill   ascorbic acid (VITAMIN C) 500 MG tablet Take 500 mg by mouth daily. Takes 2x/week     aspirin EC 81 MG tablet Take 81 mg by mouth daily. Swallow whole.     atorvastatin (LIPITOR) 40 MG tablet Take 1 tablet (40 mg total) by mouth daily. 30 tablet    cetirizine (ZYRTEC) 10 MG tablet Take 10 mg by mouth as needed for allergies.     esomeprazole (NEXIUM) 40 MG capsule Take 1 capsule (40 mg total) by mouth daily. 90 capsule 0   hyoscyamine (LEVSIN SL) 0.125 MG SL tablet Take 1 tablet (0.125 mg total) by mouth every 8 (eight) hours as needed. 30 tablet 2   ibuprofen (ADVIL) 200 MG tablet Take 200 mg by mouth as needed.     losartan (COZAAR) 25 MG tablet TAKE ONE TABLET EACH DAY FOR HIGH BLOOD PRESSURE. 90 tablet 3   Polyethyl Glycol-Propyl Glycol (SYSTANE OP) Place 1 drop into both eyes daily.     psyllium (METAMUCIL) 58.6 % packet Take 1 packet by mouth daily.     tadalafil (CIALIS) 5 MG tablet Take 5 mg by mouth as needed for erectile dysfunction.  No current facility-administered medications on file prior to visit.    Past Medical History:  Diagnosis Date   Allergy    Seasonal   Anxiety    Aortic stenosis 05/19/2018   Mild by echo 04/2018, mean grad   Arthritis    back   Cancer [facility])    prostate cancer   Complication of anesthesia    Diverticula of colon    left side.   GERD (gastroesophageal reflux disease)    food related   Herniated disc    3 herniated discs in neck   History of kidney stones    Hyperlipidemia 09/30/2022   Hypertension    Left bundle branch block    Noted since 2016 on EKG   PONV  (postoperative nausea and vomiting)    Seasonal allergies    Allergies  Allergen Reactions   Penicillins Other (See Comments)    Did it involve swelling of the face/tongue/throat, SOB, or low BP? No Did it involve sudden or severe rash/hives, skin peeling, or any reaction on the inside of your mouth or nose? No Did you need to seek medical attention at a hospital or doctor's office? No When did it last happen?      Over 10 years If all above answers are "NO", may proceed with cephalosporin use.    Numbness around mouth    Ciprofloxacin Rash   Darvocet [Propoxyphene N-Acetaminophen] Rash   Tylenol [Acetaminophen] Rash    Social History   Socioeconomic History   Marital status: Married    Spouse name: Troy [name]   Number of children: 2   Years of education: Not on file   Highest education level: 12th grade  Occupational History   Occupation: retired    Comment: Optician, 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[place]   Tobacco Use   Smoking status: Never    Passive exposure: Never   Smokeless tobacco: Never  Vaping Use   Vaping status: Never Used  Substance and Sexual Activity   Alcohol use: No   Drug use: No   Sexual activity: Yes  Other Topics Concern   Not on file  Social History Narrative   Not on file   Social Determinants of Health   Financial Resource Strain: Low Risk  (01/14/2023)   Overall Financial Resource Strain (CARDIA)    Difficulty of Paying Living Expenses: Not very hard  Food Insecurity: No Food Insecurity (01/14/2023)   Hunger Vital Sign    Worried About Running Out of Food in the Last Year: Never true    Ran Out of Food in the Last Year: Never true  Transportation Needs: No Transportation Needs (01/14/2023)   PRAPARE - Administrator, Civil Service (Medical): No    Lack of Transportation (Non-Medical): No  Physical Activity: Inactive (01/14/2023)   Exercise Vital Sign    Days of Exercise per Week: 0 days    Minutes of Exercise per Session: 0 min  Stress:  Stress Concern Present (01/14/2023)   [employer] of Occupational Health - Occupational Stress Questionnaire    Feeling of Stress : To some extent  Social Connections: Moderately Integrated (01/14/2023)   Social Connection and Isolation Panel [NHANES]    Frequency of Communication with Friends and Family: Once a week    Frequency of Social Gatherings with Friends and Family: Once a week    Attends Religious Services: More than 4 times per year    Active Member of [employer] or Organizations: Yes    Attends Club or  Organization Meetings: More than 4 times per year    Marital Status: Married    Vitals:   03/02/23 1015  BP: 110/70  Pulse: 91  Resp: 16  Temp: 97.8 F (36.6 C)  SpO2: 98%   Body mass index is 25.58 kg/m.  Physical Exam Vitals and nursing note reviewed.  Constitutional:      General: He is not in acute distress.    Appearance: He is well-developed.  HENT:     Head: Normocephalic and atraumatic.     Mouth/Throat:     Mouth: Mucous membranes are moist.     Pharynx: Oropharynx is clear.  Eyes:     Conjunctiva/sclera: Conjunctivae normal.  Cardiovascular:     Rate and Rhythm: Normal rate and regular rhythm.     Pulses:          Posterior tibial pulses are 2+ on the right side and 2+ on the left side.     Heart sounds: Murmur (SEM I-II/VI LUSB.) heard.  Pulmonary:     Effort: Pulmonary effort is normal. No respiratory distress.     Breath sounds: Normal breath sounds.  Abdominal:     Palpations: Abdomen is soft. There is no hepatomegaly or mass.     Tenderness: There is no abdominal tenderness.  Musculoskeletal:     Right lower leg: No edema.     Left lower leg: No edema.  Lymphadenopathy:     Cervical: No cervical adenopathy.  Skin:    General: Skin is warm.     Findings: No erythema or rash.  Neurological:     Mental Status: He is alert and oriented to person, place, and time.     Cranial Nerves: No cranial nerve deficit.     Gait: Gait normal.   Psychiatric:        Mood and Affect: Mood and affect normal.   ASSESSMENT AND PLAN:  Mr. Scalisi was seen today for chronic disease management.   Orders Placed This Encounter  Procedures   Pneumococcal conjugate vaccine 20-valent (Prevnar 20)   Essential (primary) hypertension Assessment & Plan: BP today adequately controlled. He is reporting some mildly elevated BPs at home, 140s/80's.  Recommend monitoring BP daily for the next 2 weeks and to let me know about readings. For now continue losartan 25 mg daily and low-salt diet.   Anxiety disorder, unspecified type Assessment & Plan: Reporting great improvement with BuSpar 5 mg 3 times daily and was able to stop clonazepam, so no changes today. We discussed some side effects, he reports having some dry mouth. New prescription sent. Follow-up in 6 months, before if needed.  Orders: -     busPIRone HCl; Take 1 tablet (5 mg total) by mouth 3 (three) times daily.  Dispense: 270 tablet; Refill: 1  Need for pneumococcal vaccination -     Pneumococcal conjugate vaccine 20-valent  Bilateral tinnitus Assessment & Plan: Problem is still present, reports some improvement since his last visit. Associated mild hearing loss. Recently evaluated by ENT, per patient report he was reassured, decided not to try hearing aids. Continue monitoring for new symptoms.   Return in about 6 months (around 08/31/2023) for chronic problems.  I, Rolla Etienne Wierda, acting as a scribe for Jamen Loiseau [place], MD., have documented all relevant documentation on the behalf of Shamarion Coots [place], MD, as directed by  Jarone Ostergaard [place], MD while in the presence of Clayton Jarmon [place], MD.   I, Monty Spicher [place], MD, have reviewed all documentation for this visit. The  documentation on 03/02/23 for the exam, diagnosis, procedures, and orders are all accurate and complete.  Xin Klawitter G. [place], MD  [facility]. [place] office.

## 2023-03-02 NOTE — Assessment & Plan Note (Addendum)
Reporting great improvement with BuSpar 5 mg 3 times daily and was able to stop clonazepam, so no changes today. We discussed some side effects, he reports having some dry mouth. New prescription sent. Follow-up in 6 months, before if needed.

## 2023-03-02 NOTE — Assessment & Plan Note (Signed)
Problem is still present, reports some improvement since his last visit. Associated mild hearing loss. Recently evaluated by ENT, per patient report he was reassured, decided not to try hearing aids. Continue monitoring for new symptoms.

## 2023-03-02 NOTE — Assessment & Plan Note (Addendum)
BP today adequately controlled. He is reporting some mildly elevated BPs at home, 140s/80's.  Recommend monitoring BP daily for the next 2 weeks and to let me know about readings. For now continue losartan 25 mg daily and low-salt diet.

## 2023-03-02 NOTE — Patient Instructions (Addendum)
A few things to remember from today's visit:  Essential (primary) hypertension  Anxiety disorder, unspecified type - Plan: busPIRone (BUSPAR) 5 MG tablet Let me know about blood pressure readings in 2 weeks, take it daily. No changes today.  If you need refills for medications you take chronically, please call your pharmacy. Do not use My Chart to request refills or for acute issues that need immediate attention. If you send a my chart message, it may take a few days to be addressed, specially if I am not in the office.  Please be sure medication list is accurate. If a new problem present, please set up appointment sooner than planned today.

## 2023-04-17 ENCOUNTER — Other Ambulatory Visit: Payer: Self-pay | Admitting: Family Medicine

## 2023-05-14 ENCOUNTER — Encounter: Payer: Self-pay | Admitting: Family Medicine

## 2023-05-14 ENCOUNTER — Ambulatory Visit: Payer: No Typology Code available for payment source | Admitting: Family Medicine

## 2023-05-14 VITALS — BP 120/70 | HR 100 | Temp 98.9°F | Resp 16 | Ht 69.0 in | Wt 175.4 lb

## 2023-05-14 DIAGNOSIS — R059 Cough, unspecified: Secondary | ICD-10-CM | POA: Diagnosis not present

## 2023-05-14 DIAGNOSIS — J101 Influenza due to other identified influenza virus with other respiratory manifestations: Secondary | ICD-10-CM

## 2023-05-14 LAB — POC COVID19 BINAXNOW: SARS Coronavirus 2 Ag: NEGATIVE

## 2023-05-14 LAB — POCT INFLUENZA A/B
Influenza A, POC: POSITIVE — AB
Influenza B, POC: NEGATIVE

## 2023-05-14 MED ORDER — BENZONATATE 100 MG PO CAPS
100.0000 mg | ORAL_CAPSULE | Freq: Two times a day (BID) | ORAL | 0 refills | Status: AC | PRN
Start: 2023-05-14 — End: 2023-05-24

## 2023-05-14 MED ORDER — FLUTICASONE PROPIONATE 50 MCG/ACT NA SUSP: 1.0000 | Freq: Two times a day (BID) | NASAL | 0 refills | Status: DC

## 2023-05-14 MED ORDER — HYDROCODONE BIT-HOMATROP MBR 5-1.5 MG/5ML PO SOLN: 5.0000 mL | Freq: Two times a day (BID) | ORAL | 0 refills | Status: AC | PRN

## 2023-05-14 NOTE — Progress Notes (Signed)
ACUTE VISIT Chief Complaint  Patient presents with   Nasal Congestion    Started on Tuesday and gradually feeling worse each day. Pt's wife did test positive for Flu A   Sore Throat   Cough   HPI: [name] is a 68 y.o. male with a PMHx significant for anxiety, PAD, HTN, and HLD, who is here today complaining of respiratory symptoms as described above.   Patient complains of subjective fever,chills, body aches, headaches, nasal congestion, rhinorrhea, sore throat, non-productive cough, and fatigue since 05/11/23. Cough interferes with sleep. Clear sputum.  Cough This is a new problem. The current episode started in the past 7 days. The problem has been unchanged. The problem occurs every few hours. Associated symptoms include myalgias and postnasal drip. Pertinent negatives include no chest pain, ear congestion, ear pain, eye redness, heartburn, hemoptysis, rash, sweats or weight loss. The symptoms are aggravated by lying down. His past medical history is significant for environmental allergies.   He has been taking advil for the headaches and body aches and Afrin for the nasal congestion.  His wife has been dx'ed with Flu a day or 2 before his symptoms started. Denies wheezing or SOB.   Review of Systems  Constitutional:  Positive for activity change, appetite change and fatigue. Negative for weight loss.  HENT:  Positive for postnasal drip. Negative for ear pain and sinus pain.   Eyes:  Negative for discharge and redness.  Respiratory:  Positive for cough. Negative for hemoptysis.   Cardiovascular:  Negative for chest pain.  Gastrointestinal:  Negative for abdominal pain, heartburn, nausea and vomiting.  Genitourinary:  Negative for decreased urine volume and dysuria.  Musculoskeletal:  Positive for myalgias.  Skin:  Negative for rash.  Allergic/Immunologic: Positive for environmental allergies.  Neurological:  Negative for syncope and facial asymmetry.   Psychiatric/Behavioral:  Negative for confusion and hallucinations.   See other pertinent positives and negatives in HPI.  Current Outpatient Medications on File Prior to Visit  Medication Sig Dispense Refill   ascorbic acid (VITAMIN C) 500 MG tablet Take 500 mg by mouth daily. Takes 2x/week     aspirin EC 81 MG tablet Take 81 mg by mouth daily. Swallow whole.     atorvastatin (LIPITOR) 40 MG tablet Take 1 tablet (40 mg total) by mouth daily. 30 tablet    busPIRone (BUSPAR) 5 MG tablet Take 1 tablet (5 mg total) by mouth 3 (three) times daily. 270 tablet 1   cetirizine (ZYRTEC) 10 MG tablet Take 10 mg by mouth as needed for allergies.     esomeprazole (NEXIUM) 40 MG capsule TAKE 1 CAPSULE (40 MG TOTAL) BY MOUTH DAILY. 90 capsule 2   hyoscyamine (LEVSIN SL) 0.125 MG SL tablet Take 1 tablet (0.125 mg total) by mouth every 8 (eight) hours as needed. 30 tablet 2   ibuprofen (ADVIL) 200 MG tablet Take 200 mg by mouth as needed.     losartan (COZAAR) 25 MG tablet TAKE ONE TABLET EACH DAY FOR HIGH BLOOD PRESSURE. 90 tablet 3   Polyethyl Glycol-Propyl Glycol (SYSTANE OP) Place 1 drop into both eyes daily.     psyllium (METAMUCIL) 58.6 % packet Take 1 packet by mouth daily.     tadalafil (CIALIS) 5 MG tablet Take 5 mg by mouth as needed for erectile dysfunction.     No current facility-administered medications on file prior to visit.    Past Medical History:  Diagnosis Date   Allergy    Seasonal  Anxiety    Aortic stenosis 05/19/2018   Mild by echo 04/2018, mean grad   Arthritis    back   Cancer [facility])    prostate cancer   Complication of anesthesia    Diverticula of colon    left side.   GERD (gastroesophageal reflux disease)    food related   Herniated disc    3 herniated discs in neck   History of kidney stones    Hyperlipidemia 09/30/2022   Hypertension    Left bundle branch block    Noted since 2016 on EKG   PONV (postoperative nausea and vomiting)    Seasonal allergies     Allergies  Allergen Reactions   Penicillins Other (See Comments)    Did it involve swelling of the face/tongue/throat, SOB, or low BP? No Did it involve sudden or severe rash/hives, skin peeling, or any reaction on the inside of your mouth or nose? No Did you need to seek medical attention at a hospital or doctor's office? No When did it last happen?      Over 10 years If all above answers are "NO", may proceed with cephalosporin use.    Numbness around mouth    Ciprofloxacin Rash   Darvocet [Propoxyphene N-Acetaminophen] Rash   Tylenol [Acetaminophen] Rash    Social History   Socioeconomic History   Marital status: Married    Spouse name: Braxxton Stoudt   Number of children: 2   Years of education: Not on file   Highest education level: 12th grade  Occupational History   Occupation: retired    Comment: Optician, 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[place]   Tobacco Use   Smoking status: Never    Passive exposure: Never   Smokeless tobacco: Never  Vaping Use   Vaping status: Never Used  Substance and Sexual Activity   Alcohol use: No   Drug use: No   Sexual activity: Yes  Other Topics Concern   Not on file  Social History Narrative   Not on file   Social Drivers of Health   Financial Resource Strain: Low Risk  (05/13/2023)   Overall Financial Resource Strain (CARDIA)    Difficulty of Paying Living Expenses: Not very hard  Food Insecurity: No Food Insecurity (05/13/2023)   Hunger Vital Sign    Worried About Running Out of Food in the Last Year: Never true    Ran Out of Food in the Last Year: Never true  Transportation Needs: No Transportation Needs (05/13/2023)   PRAPARE - Administrator, Civil Service (Medical): No    Lack of Transportation (Non-Medical): No  Physical Activity: Inactive (05/13/2023)   Exercise Vital Sign    Days of Exercise per Week: 0 days    Minutes of Exercise per Session: 0 min  Stress: Stress Concern Present (05/13/2023)   [employer] of  Occupational Health - Occupational Stress Questionnaire    Feeling of Stress : To some extent  Social Connections: Socially Integrated (05/13/2023)   Social Connection and Isolation Panel [NHANES]    Frequency of Communication with Friends and Family: Twice a week    Frequency of Social Gatherings with Friends and Family: Once a week    Attends Religious Services: More than 4 times per year    Active Member of [employer] or Organizations: Yes    Attends Banker Meetings: More than 4 times per year    Marital Status: Married    Vitals:   05/14/23 1416  BP: 120/70  Pulse:  100  Resp: 16  Temp: 98.9 F (37.2 C)  SpO2: 96%   Body mass index is 25.9 kg/m.  Physical Exam Vitals and nursing note reviewed.  Constitutional:      General: He is not in acute distress.    Appearance: He is well-developed. He is not ill-appearing.  HENT:     Head: Normocephalic and atraumatic.     Right Ear: Tympanic membrane, ear canal and external ear normal.     Left Ear: Tympanic membrane, ear canal and external ear normal.     Nose: Congestion and rhinorrhea present.     Mouth/Throat:     Mouth: Mucous membranes are moist.     Pharynx: Oropharynx is clear. Uvula midline.  Eyes:     Conjunctiva/sclera: Conjunctivae normal.  Cardiovascular:     Rate and Rhythm: Normal rate and regular rhythm.     Heart sounds: Murmur (SEM I-II/VI LUSB) heard.  Pulmonary:     Effort: Pulmonary effort is normal. No respiratory distress.     Breath sounds: Normal breath sounds. No stridor.  Lymphadenopathy:     Head:     Right side of head: No submandibular adenopathy.     Left side of head: No submandibular adenopathy.     Cervical: No cervical adenopathy.  Skin:    General: Skin is warm.     Findings: No erythema or rash.  Neurological:     Mental Status: He is alert and oriented to person, place, and time.  Psychiatric:        Mood and Affect: Mood and affect normal.   ASSESSMENT AND PLAN:  Mr.  Simson was seen today for URI symptoms.  Cough, unspecified type He tested positive for Flu in the office today.  Symptomatic treatment with Benzonatate during the day and Hycodan at night, some side effects discussed. Explained that cough and congestion can last a few more days and even weeks after acute symptoms resolved.  -     POCT Influenza A/B -     POC COVID-19 BinaxNow -     HYDROcodone Bit-Homatrop MBr; Take 5 mLs by mouth every 12 (twelve) hours as needed for up to 10 days for cough.  Dispense: 80 mL; Refill: 0 -     Benzonatate; Take 1 capsule (100 mg total) by mouth 2 (two) times daily as needed for up to 10 days.  Dispense: 20 capsule; Refill: 0  Influenza A Symptoms started 3 days ago, so antiviral medication is not recommended. Continue adequate hydration and rest. Advil 200 mg 1-2 tabs tid with food as needed (allergic to acetaminophen). Avoid intra nasal Afrin. Flonase nasal spray at night x 10-14 days, nasal saline irrigations as needed. Instructed about warning signs.  -     Fluticasone Propionate; Place 1 spray into both nostrils 2 (two) times daily.  Dispense: 16 g; Refill: 0  Return if symptoms worsen or fail to improve, for keep next appointment.  I, Rolla Etienne Wierda, acting as a scribe for Cay Kath [place], MD., have documented all relevant documentation on the behalf of Sheniece Ruggles [place], MD, as directed by  Kainoah Bartosiewicz [place], MD while in the presence of Doris Mcgilvery [place], MD.   I, Kiyomi Pallo [place], MD, have reviewed all documentation for this visit. The documentation on 05/14/23 for the exam, diagnosis, procedures, and orders are all accurate and complete.  Lamaya Hyneman G. [place], MD  [facility]. [place] office.

## 2023-05-14 NOTE — Patient Instructions (Addendum)
A few things to remember from today's visit:  Cough, unspecified type - Plan: POCT Influenza A/B, POC COVID-19  Influenza A Avoid afrin in nose. Flonase nasal spray at night for 10-14 days, nasal saline during the day as needed. Benzonatate for day time and Hycodan for night time to help with cough.  If you need refills for medications you take chronically, please call your pharmacy. Do not use My Chart to request refills or for acute issues that need immediate attention. If you send a my chart message, it may take a few days to be addressed, specially if I am not in the office.  Please be sure medication list is accurate. If a new problem present, please set up appointment sooner than planned today.

## 2023-05-24 ENCOUNTER — Telehealth: Payer: Self-pay | Admitting: Family Medicine

## 2023-05-24 MED ORDER — OMEPRAZOLE 40 MG PO CPDR: 40.0000 mg | DELAYED_RELEASE_CAPSULE | Freq: Every day | ORAL | 3 refills | Status: DC

## 2023-05-24 NOTE — Telephone Encounter (Signed)
 Pt stopped by the office with a letter from [facility] stating the Esomepra Mag Cap 40 mg Dr isn't covered unless he tries other medications first.  The medications available on his insurance would be Lansoprazole caps, Omeprazole caps or Pantoprazole tabs.  They are asking that he tries one of these first.  Pt has 3 or 4 days left of his prescription.  Pharmacy- [facility] [address]

## 2023-05-24 NOTE — Telephone Encounter (Signed)
 Per PCP - okay to switch to Omeprazole 40 mg daily. Rx sent into pharmacy.

## 2023-06-17 ENCOUNTER — Other Ambulatory Visit: Payer: Self-pay | Admitting: Family Medicine

## 2023-08-09 DIAGNOSIS — M1812 Unilateral primary osteoarthritis of first carpometacarpal joint, left hand: Secondary | ICD-10-CM | POA: Diagnosis not present

## 2023-08-21 ENCOUNTER — Other Ambulatory Visit: Payer: Self-pay | Admitting: Family Medicine

## 2023-08-31 ENCOUNTER — Encounter: Payer: Self-pay | Admitting: Family Medicine

## 2023-08-31 ENCOUNTER — Ambulatory Visit (INDEPENDENT_AMBULATORY_CARE_PROVIDER_SITE_OTHER): Payer: Medicare HMO | Admitting: Family Medicine

## 2023-08-31 VITALS — BP 138/80 | HR 75 | Resp 16 | Ht 69.0 in | Wt 178.2 lb

## 2023-08-31 DIAGNOSIS — I10 Essential (primary) hypertension: Secondary | ICD-10-CM | POA: Diagnosis not present

## 2023-08-31 DIAGNOSIS — M545 Low back pain, unspecified: Secondary | ICD-10-CM

## 2023-08-31 DIAGNOSIS — I739 Peripheral vascular disease, unspecified: Secondary | ICD-10-CM | POA: Diagnosis not present

## 2023-08-31 DIAGNOSIS — E785 Hyperlipidemia, unspecified: Secondary | ICD-10-CM | POA: Diagnosis not present

## 2023-08-31 DIAGNOSIS — R7309 Other abnormal glucose: Secondary | ICD-10-CM

## 2023-08-31 DIAGNOSIS — C61 Malignant neoplasm of prostate: Secondary | ICD-10-CM

## 2023-08-31 DIAGNOSIS — F419 Anxiety disorder, unspecified: Secondary | ICD-10-CM

## 2023-08-31 LAB — LIPID PANEL
Cholesterol: 129 mg/dL (ref 0–200)
HDL: 40.3 mg/dL (ref >39.00)
LDL Cholesterol: 68 mg/dL (ref 0–99)
NonHDL: 89.03
Total CHOL/HDL Ratio: 3
Triglycerides: 107 mg/dL (ref 0.0–149.0)
VLDL: 21.4 mg/dL (ref 0.0–40.0)

## 2023-08-31 LAB — COMPREHENSIVE METABOLIC PANEL WITH GFR
ALT: 24 U/L (ref 0–53)
AST: 21 U/L (ref 0–37)
Albumin: 4.7 g/dL (ref 3.5–5.2)
Alkaline Phosphatase: 91 U/L (ref 39–117)
BUN: 10 mg/dL (ref 6–23)
CO2: 27 meq/L (ref 19–32)
Calcium: 10 mg/dL (ref 8.4–10.5)
Chloride: 103 meq/L (ref 96–112)
Creatinine, Ser: 1.01 mg/dL (ref 0.40–1.50)
GFR: 78.04 mL/min (ref >60.00)
Glucose, Bld: 86 mg/dL (ref 70–99)
Potassium: 3.9 meq/L (ref 3.5–5.1)
Sodium: 139 meq/L (ref 135–145)
Total Bilirubin: 1 mg/dL (ref 0.2–1.2)
Total Protein: 7.5 g/dL (ref 6.0–8.3)

## 2023-08-31 LAB — HEMOGLOBIN A1C: Hgb A1c MFr Bld: 6 % (ref 4.6–6.5)

## 2023-08-31 MED ORDER — CELECOXIB 100 MG PO CAPS: 100.0000 mg | ORAL_CAPSULE | Freq: Two times a day (BID) | ORAL | 0 refills | Status: AC

## 2023-08-31 MED ORDER — LOSARTAN POTASSIUM 50 MG PO TABS: 50.0000 mg | ORAL_TABLET | Freq: Every day | ORAL | 1 refills | Status: DC

## 2023-08-31 MED ORDER — TIZANIDINE HCL 4 MG PO TABS: 4.0000 mg | ORAL_TABLET | Freq: Every day | ORAL | 0 refills | Status: AC

## 2023-08-31 MED ORDER — BUSPIRONE HCL 10 MG PO TABS: 10.0000 mg | ORAL_TABLET | Freq: Three times a day (TID) | ORAL | 1 refills | Status: DC

## 2023-08-31 NOTE — Assessment & Plan Note (Signed)
 S/P surgical treatment. Follows with urologist annually. Last PSA 0.0 in 02/2023.

## 2023-08-31 NOTE — Assessment & Plan Note (Signed)
 Problem is not well-controlled. We discussed options, including resuming clonazepam or increasing dose of BuSpar , he prefer the latter one. BuSpar  increased from 5 mg 3 times daily to 10 mg 3 times daily. Follow-up in 5 to 6 months, before if needed.

## 2023-08-31 NOTE — Assessment & Plan Note (Signed)
 BP mildly elevated, similar readings at home. He agrees with increasing losartan  from 25 mg to 50 mg daily. Continue low-salt diet. Continue monitoring BP regularly. Eye exam is current. Follow-up in 6 months, before if needed.

## 2023-08-31 NOTE — Assessment & Plan Note (Signed)
Continue atorvastatin 40 mg daily and low-fat diet. Further recommendation will be given according to lipid panel result. 

## 2023-08-31 NOTE — Progress Notes (Unsigned)
 HPI: Troy [name] is a 68 y.o. male with a PMHx significant for anxiety, Prostate cancer, GERD, PAD, HTN, and HLD. He is is here today for chronic disease management.  Last seen on 05/14/2023.  Hypertension:  Medications:Losartan  25 mg daily. BP readings at home: He has some readings of 120/80 with occasional high readings of around 138/80 Side effects: none Negative for unusual or severe headache, visual changes, exertional chest pain, dyspnea, palpitations focal weakness, or edema.  Lab Results  Component Value Date   CREATININE 1.00 12/03/2022   BUN 6 (L) 12/03/2022   NA 141 12/03/2022   K 3.5 12/03/2022   CL 103 12/03/2022   CO2 26 12/03/2022   Hyperlipidemia: Currently on Atorvastatin  40 mg daily.  Side effects from medication: none Lab Results  Component Value Date   CHOL 125 09/30/2022   HDL 33.90 (L) 09/30/2022   LDLCALC 65 09/30/2022   TRIG 130.0 09/30/2022   CHOLHDL 4 09/30/2022   Anxiety: Currently On Buspar  5 mg; takes it twice or three times daily depending on the day.  Some days mood well controlled.  However, he states 3-4 days out of the week, he feels he needs "something extra". Was on Clonzapem in the past.  GERD: On Omeprazole  40 mg daily. No acute concerns today.   Prostate Cancer: Is seeing Urology annually. Last PSA was normal (0.0).  PAD:  He has a hx of carotid stenosis and has annual carotid ultrasounds.  Currently on aspirin  81 mg daily.   Acute Lower Back Pain: States he has been experiencing right lower back since last wk after picking up a table.  Rates the pain 6-7/10. The pain has not been worsening.  The pain has not been radiating to his legs. Has been taking Advil  which has not been very effective. In the past has taken Prednisone  and Meloxicam. Pertinent negative include numbness and tingling sensation in his legs.  Mentions a new bruise on his left forearm  Lab Results  Component Value Date   WBC 5.1 12/03/2022    HGB 14.6 12/03/2022   HCT 43.0 12/03/2022   MCV 90.3 12/03/2022   PLT 216 12/03/2022   Glucose has been elevated in the 140's in the past. Lab Results  Component Value Date   HGBA1C 5.6 01/01/2023   Review of Systems  Constitutional:  Negative for activity change, appetite change and fever.  HENT:  Negative for mouth sores and sore throat.   Respiratory:  Negative for cough and wheezing.   Endocrine: Negative for cold intolerance and heat intolerance.  Genitourinary:  Negative for decreased urine volume, dysuria and hematuria.  Skin:  Negative for rash.  Neurological:  Negative for syncope and facial asymmetry.  Psychiatric/Behavioral:  Negative for confusion and hallucinations. The patient is nervous/anxious.   See other pertinent positives and negatives in HPI.  Current Outpatient Medications on File Prior to Visit  Medication Sig Dispense Refill   ascorbic acid (VITAMIN C) 500 MG tablet Take 500 mg by mouth daily. Takes 2x/week     aspirin  EC 81 MG tablet Take 81 mg by mouth daily. Swallow whole.     atorvastatin  (LIPITOR) 40 MG tablet TAKE 1 TABLET BY MOUTH EVERY DAY FOR HIGH CHOLESTEROL 90 tablet 3   cetirizine (ZYRTEC) 10 MG tablet Take 10 mg by mouth as needed for allergies.     hyoscyamine  (LEVSIN SL) 0.125 MG SL tablet Take 1 tablet (0.125 mg total) by mouth every 8 (eight) hours as needed. 30  tablet 2   ibuprofen  (ADVIL ) 200 MG tablet Take 200 mg by mouth as needed.     omeprazole  (PRILOSEC) 40 MG capsule TAKE 1 CAPSULE (40 MG TOTAL) BY MOUTH DAILY. 90 capsule 1   Polyethyl Glycol-Propyl Glycol (SYSTANE OP) Place 1 drop into both eyes daily.     tadalafil (CIALIS) 5 MG tablet Take 5 mg by mouth as needed for erectile dysfunction.     No current facility-administered medications on file prior to visit.   Past Medical History:  Diagnosis Date   Allergy    Seasonal   Anxiety    Aortic stenosis 05/19/2018   Mild by echo 04/2018, mean grad   Arthritis    back    Cancer [facility])    prostate cancer   Complication of anesthesia    Diverticula of colon    left side.   GERD (gastroesophageal reflux disease)    food related   Herniated disc    3 herniated discs in neck   History of kidney stones    Hyperlipidemia 09/30/2022   Hypertension    Left bundle branch block    Noted since 2016 on EKG   PONV (postoperative nausea and vomiting)    Seasonal allergies    Allergies  Allergen Reactions   Penicillins Other (See Comments)    Did it involve swelling of the face/tongue/throat, SOB, or low BP? No Did it involve sudden or severe rash/hives, skin peeling, or any reaction on the inside of your mouth or nose? No Did you need to seek medical attention at a hospital or doctor's office? No When did it last happen?      Over 10 years If all above answers are "NO", may proceed with cephalosporin use.    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[place]   Tobacco Use   Smoking status: Never    Passive exposure: Never   Smokeless tobacco: Never  Vaping Use   Vaping status: Never Used  Substance and Sexual Activity   Alcohol  use: No   Drug use: No   Sexual activity: Yes  Other Topics Concern   Not on file  Social History Narrative   Not on file   Social Drivers of Health   Financial Resource Strain: Low Risk  (05/13/2023)   Overall Financial Resource Strain (CARDIA)    Difficulty of Paying Living Expenses: Not very hard  Food Insecurity: No Food Insecurity (05/13/2023)   Hunger Vital Sign    Worried About Running Out of Food in the Last Year: Never true    Ran Out of Food in the Last Year:  Never true  Transportation Needs: No Transportation Needs (05/13/2023)   PRAPARE - Administrator, Civil Service (Medical): No    Lack of Transportation (Non-Medical): No  Physical Activity: Inactive (05/13/2023)   Exercise Vital Sign    Days of Exercise per Week: 0 days    Minutes of Exercise per Session: 0 min  Stress: Stress Concern Present (05/13/2023)   [employer] of Occupational Health - Occupational Stress Questionnaire    Feeling of Stress :  To some extent  Social Connections: Socially Integrated (05/13/2023)   Social Connection and Isolation Panel [NHANES]    Frequency of Communication with Friends and Family: Twice a week    Frequency of Social Gatherings with Friends and Family: Once a week    Attends Religious Services: More than 4 times per year    Active Member of [employer] or Organizations: Yes    Attends Banker Meetings: More than 4 times per year    Marital Status: Married   Vitals:   08/31/23 1018  BP: 138/80  Pulse: 75  Resp: 16  SpO2: 97%   Body mass index is 26.32 kg/m.  Physical Exam Vitals and nursing note reviewed.  Constitutional:      General: He is not in acute distress.    Appearance: He is well-developed.  HENT:     Head: Normocephalic and atraumatic.     Mouth/Throat:     Mouth: Mucous membranes are moist.     Pharynx: Oropharynx is clear. Uvula midline.  Eyes:     Conjunctiva/sclera: Conjunctivae normal.  Cardiovascular:     Rate and Rhythm: Normal rate and regular rhythm.     Pulses:          Dorsalis pedis pulses are 2+ on the right side and 2+ on the left side.     Heart sounds: Murmur (SEM I-II/VI LUSB) heard.  Pulmonary:     Effort: Pulmonary effort is normal. No respiratory distress.     Breath sounds: Normal breath sounds.  Abdominal:     Palpations: Abdomen is soft. There is no hepatomegaly or mass.     Tenderness: There is no abdominal tenderness.  Musculoskeletal:     Lumbar back: No tenderness.  Negative right straight leg raise test and negative left straight leg raise test.     Right lower leg: No edema.     Left lower leg: No edema.     Comments: No significant deformity appreciated. Pain elicited with movement on exam table during examination. No local edema or erythema appreciated, no suspicious lesions.  Lymphadenopathy:     Cervical: No cervical adenopathy.  Skin:    General: Skin is warm.     Findings: Ecchymosis (one on left forearm.) present. No erythema or rash.  Neurological:     Mental Status: He is alert and oriented to person, place, and time.     Cranial Nerves: No cranial nerve deficit.     Gait: Gait normal.  Psychiatric:        Mood and Affect: Mood and affect normal.    ASSESSMENT AND PLAN:  Mr.Henneman was seen today for chronic disease management.   Orders Placed This Encounter  Procedures   Comprehensive metabolic panel with GFR   Lipid panel   Hemoglobin A1c    Acute right-sided low back pain without sciatica -     Celecoxib; Take 1 capsule (100 mg total) by mouth 2 (two) times daily for 10 days.  Dispense: 20 capsule; Refill: 0 -     tiZANidine HCl; Take 1 tablet (4 mg total) by mouth at bedtime.  Dispense: 30 tablet; Refill: 0  Essential (primary) hypertension Assessment & Plan: BP mildly elevated, similar readings at home. He agrees with increasing losartan  from 25 mg to 50 mg daily. Continue low-salt diet. Continue monitoring BP regularly. Eye exam is current. Follow-up in 6 months, before if needed.  Orders: -     Losartan  Potassium; Take 1 tablet (50 mg total) by  mouth daily.  Dispense: 90 tablet; Refill: 1 -     Comprehensive metabolic panel with GFR; Future  Anxiety disorder, unspecified type Assessment & Plan: Problem is not well-controlled. We discussed options, including resuming clonazepam or increasing dose of BuSpar , he prefer the latter one. BuSpar  increased from 5 mg 3 times daily to 10 mg 3 times daily. Follow-up in 5  to 6 months, before if needed.  Orders: -     busPIRone  HCl; Take 1 tablet (10 mg total) by mouth 3 (three) times daily.  Dispense: 90 tablet; Refill: 1  PAD (peripheral artery disease) (HCC) Assessment & Plan: Scheduled for cardiology EUS. Currently on Aspirin  81 mg daily and atorvastatin  40 mg daily. Last LDL 65 in 09/2022. Following with vascular.   Hyperlipidemia, unspecified hyperlipidemia type Assessment & Plan: Continue atorvastatin  40 mg daily and low-fat diet. Further recommendation will be given according to lipid panel result.  Orders: -     Comprehensive metabolic panel with GFR; Future -     Lipid panel; Future  Malignant neoplasm of prostate [facility]) Assessment & Plan: S/P surgical treatment. Follows with urologist annually. Last PSA 0.0 in 02/2023.   Elevated glucose -     Hemoglobin A1c; Future    Return in about 6 months (around 03/01/2024) for chronic problems.  I, Fritz Jewel Wierda, acting as a scribe for Sloka Volante [place], MD., have documented all relevant documentation on the behalf of Priscille Shadduck [place], MD, as directed by  Jood Retana [place], MD while in the presence of Jibril Mcminn [place], MD.   I, Solly Derasmo [place], MD, have reviewed all documentation for this visit. The documentation on 08/31/23 for the exam, diagnosis, procedures, and orders are all accurate and complete.   Natayah Warmack G. [place], MD  [facility]. [place] office.

## 2023-08-31 NOTE — Patient Instructions (Addendum)
 A few things to remember from today's visit:  Essential (primary) hypertension - Plan: losartan  (COZAAR ) 50 MG tablet  Acute right-sided low back pain without sciatica - Plan: celecoxib (CELEBREX) 100 MG capsule, tiZANidine (ZANAFLEX) 4 MG tablet  Anxiety disorder, unspecified type - Plan: busPIRone  (BUSPAR ) 10 MG tablet  PAD (peripheral artery disease) (HCC)  Hyperlipidemia, unspecified hyperlipidemia type  Increase Burpar from 5 mg to 10 mg 3 times per day. Losartan  increased from 25 mg to 50 mg daily. Blood pressure goal 120's/70's.  Celebrex for 7-10 days and not at the same time with Aspirin . Tizanidine at bedtime for 2 weeks and then as needed.  If you need refills for medications you take chronically, please call your pharmacy. Do not use My Chart to request refills or for acute issues that need immediate attention. If you send a my chart message, it may take a few days to be addressed, specially if I am not in the office.  Please be sure medication list is accurate. If a new problem present, please set up appointment sooner than planned today.

## 2023-08-31 NOTE — Assessment & Plan Note (Signed)
 Scheduled for cardiology EUS. Currently on Aspirin  81 mg daily and atorvastatin  40 mg daily. Last LDL 65 in 09/2022. Following with vascular.

## 2023-09-02 ENCOUNTER — Ambulatory Visit: Payer: Self-pay | Admitting: Family Medicine

## 2023-09-15 ENCOUNTER — Other Ambulatory Visit: Payer: Self-pay | Admitting: Vascular Surgery

## 2023-09-15 DIAGNOSIS — Z9889 Other specified postprocedural states: Secondary | ICD-10-CM

## 2023-09-15 DIAGNOSIS — I6521 Occlusion and stenosis of right carotid artery: Secondary | ICD-10-CM

## 2023-09-28 ENCOUNTER — Ambulatory Visit: Attending: Vascular Surgery | Admitting: Physician Assistant

## 2023-09-28 ENCOUNTER — Ambulatory Visit (HOSPITAL_COMMUNITY)
Admission: RE | Admit: 2023-09-28 | Discharge: 2023-09-28 | Disposition: A | Discharge status: Home / Self Care | Source: Ambulatory Visit | Attending: Vascular Surgery | Admitting: Vascular Surgery

## 2023-09-28 ENCOUNTER — Encounter: Payer: Self-pay | Admitting: Physician Assistant

## 2023-09-28 VITALS — BP 144/77 | HR 62 | Temp 97.8°F | Resp 18 | Ht 69.0 in | Wt 179.7 lb

## 2023-09-28 DIAGNOSIS — I6521 Occlusion and stenosis of right carotid artery: Secondary | ICD-10-CM

## 2023-09-28 DIAGNOSIS — Z9889 Other specified postprocedural states: Secondary | ICD-10-CM | POA: Diagnosis not present

## 2023-09-28 DIAGNOSIS — I6529 Occlusion and stenosis of unspecified carotid artery: Secondary | ICD-10-CM | POA: Diagnosis not present

## 2023-09-28 NOTE — Progress Notes (Signed)
 HISTORY AND PHYSICAL     CC:  follow up. Requesting Provider:  [place], Betty G, MD  HPI: This is a 68 y.o. male here for follow up for carotid artery stenosis.  Pt is s/p right carotid endarterectomy for asymptomatic carotid artery stenosis on 10/21/2020 by Dr. Gretta.    Pt was last seen 08/25/2022 and at that time he was not having any neurological sx.   Pt returns today for follow up.    Pt denies any amaurosis fugax, speech difficulties, weakness, numbness, paralysis or clumsiness or facial droop.    He did have some tinnitus and vertigo and underwent workup in the ER in September 2024.  He was evaluated for stroke and MRI was negative.   The pt is on a statin for cholesterol management.  The pt is on a daily aspirin .   Other AC:  none The pt is on ARB for hypertension.   The pt is not on medication for diabetes Tobacco hx:  never  Pt does not have family hx of AAA.  Past Medical History:  Diagnosis Date   Allergy    Seasonal   Anxiety    Aortic stenosis 05/19/2018   Mild by echo 04/2018, mean grad   Arthritis    back   Cancer [facility])    prostate cancer   Complication of anesthesia    Diverticula of colon    left side.   GERD (gastroesophageal reflux disease)    food related   Herniated disc    3 herniated discs in neck   History of kidney stones    Hyperlipidemia 09/30/2022   Hypertension    Left bundle branch block    Noted since 2016 on EKG   PONV (postoperative nausea and vomiting)    Seasonal allergies     Past Surgical History:  Procedure Laterality Date   BIOPSY  07/27/2018   Procedure: BIOPSY;  Surgeon: Shaaron Lamar HERO, MD;  Location: [facility];  Service: Endoscopy;;   COLONOSCOPY  01/09/2008   Dr.Rourk- normal rectum, L sided diverticulum. colonic mucosa and terminal ileum mucosa appeared normal.   COLONOSCOPY  1996   colonic adenomas   COLONOSCOPY  2001   L sided diverticula   COLONOSCOPY N/A 02/12/2014   Dr. Shaaron: diverticulosis    COLONOSCOPY N/A 07/27/2018   Procedure: COLONOSCOPY;  Surgeon: Shaaron Lamar HERO, MD;  Location: [facility];  Service: Endoscopy;  Laterality: N/A;  2:00pm   ENDARTERECTOMY Right 10/21/2020   Procedure: RIGHT CAROTID ENDARTERECTOMY;  Surgeon: Gretta Lonni PARAS, MD;  Location: [facility] OR;  Service: Vascular;  Laterality: Right;   HERNIA REPAIR Left 2008   LITHOTRIPSY     x2   NECK SURGERY     PATCH ANGIOPLASTY Right 10/21/2020   Procedure: PATCH ANGIOPLASTY USING GEORGE BIOLOGIC PATCH;  Surgeon: Gretta Lonni PARAS, MD;  Location: [facility] OR;  Service: Vascular;  Laterality: Right;   ROBOT ASSISTED LAPAROSCOPIC RADICAL PROSTATECTOMY N/A 01/25/2013   Procedure: ROBOTIC ASSISTED LAPAROSCOPIC RADICAL PROSTATECTOMY LEVEL 1;  Surgeon: Toribio Neysa Repine, MD;  Location: [facility] OR[facility];  Service: Urology;  Laterality: N/A;   SPINE SURGERY  2017 - 2018   Neck surgery    Allergies  Allergen Reactions   Penicillins Other (See Comments)    Did it involve swelling of the face/tongue/throat, SOB, or low BP? No Did it involve sudden or severe rash/hives, skin peeling, or any reaction on the inside of your mouth or nose? No Did you need to seek  medical attention at a hospital or doctor's office? No When did it last happen?      Over 10 years If all above answers are "NO", may proceed with cephalosporin use.    Numbness around mouth    Ciprofloxacin  Rash   Darvocet [Propoxyphene N-Acetaminophen ] Rash   Tylenol  [Acetaminophen ] Rash    Current Outpatient Medications  Medication Sig Dispense Refill   ascorbic acid (VITAMIN C) 500 MG tablet Take 500 mg by mouth daily. Takes 2x/week     aspirin  EC 81 MG tablet Take 81 mg by mouth daily. Swallow whole.     atorvastatin  (LIPITOR) 40 MG tablet TAKE 1 TABLET BY MOUTH EVERY DAY FOR HIGH CHOLESTEROL 90 tablet 3   busPIRone  (BUSPAR ) 10 MG tablet Take 1 tablet (10 mg total) by mouth 3 (three) times daily. 90 tablet 1   cetirizine (ZYRTEC) 10 MG tablet Take 10 mg  by mouth as needed for allergies.     hyoscyamine  (LEVSIN  SL) 0.125 MG SL tablet Take 1 tablet (0.125 mg total) by mouth every 8 (eight) hours as needed. 30 tablet 2   ibuprofen  (ADVIL ) 200 MG tablet Take 200 mg by mouth as needed.     losartan  (COZAAR ) 50 MG tablet Take 1 tablet (50 mg total) by mouth daily. 90 tablet 1   omeprazole  (PRILOSEC) 40 MG capsule TAKE 1 CAPSULE (40 MG TOTAL) BY MOUTH DAILY. 90 capsule 1   Polyethyl Glycol-Propyl Glycol (SYSTANE OP) Place 1 drop into both eyes daily.     tadalafil (CIALIS) 5 MG tablet Take 5 mg by mouth as needed for erectile dysfunction.     tiZANidine  (ZANAFLEX ) 4 MG tablet Take 1 tablet (4 mg total) by mouth at bedtime. 30 tablet 0   No current facility-administered medications for this visit.    Family History  Problem Relation Age of Onset   Dementia Mother    CAD Father    Prostate cancer Father    Cancer Father    Heart disease Father    Hypertension Brother    Hyperlipidemia Brother    Diabetes Brother    Prostate cancer Paternal Uncle    CAD Paternal Aunt    Kidney cancer Paternal Aunt    Cancer Paternal Aunt    Dementia Maternal Grandmother    Colon cancer Neg Hx    Colon polyps Neg Hx     Social History   Socioeconomic History   Marital status: Married    Spouse name: Clarion Mooneyhan   Number of children: 2   Years of education: Not on file   Highest education level: 12th grade  Occupational History   Occupation: retired    Comment: Optician, 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[place]   Tobacco Use   Smoking status: Never    Passive exposure: Never   Smokeless tobacco: Never  Vaping Use   Vaping status: Never Used  Substance and Sexual Activity   Alcohol  use: No   Drug use: No   Sexual activity: Yes  Other Topics Concern   Not on file  Social History Narrative   Not on file   Social Drivers of Health   Financial Resource Strain: Low Risk  (05/13/2023)   Overall Financial Resource Strain (CARDIA)    Difficulty of Paying Living  Expenses: Not very hard  Food Insecurity: No Food Insecurity (05/13/2023)   Hunger Vital Sign    Worried About Running Out of Food in the Last Year: Never true    Ran Out of Food in the Last  Year: Never true  Transportation Needs: No Transportation Needs (05/13/2023)   PRAPARE - Administrator, Civil Service (Medical): No    Lack of Transportation (Non-Medical): No  Physical Activity: Inactive (05/13/2023)   Exercise Vital Sign    Days of Exercise per Week: 0 days    Minutes of Exercise per Session: 0 min  Stress: Stress Concern Present (05/13/2023)   [employer] of Occupational Health - Occupational Stress Questionnaire    Feeling of Stress : To some extent  Social Connections: Socially Integrated (05/13/2023)   Social Connection and Isolation Panel    Frequency of Communication with Friends and Family: Twice a week    Frequency of Social Gatherings with Friends and Family: Once a week    Attends Religious Services: More than 4 times per year    Active Member of [employer] or Organizations: Yes    Attends Engineer, structural: More than 4 times per year    Marital Status: Married  Catering manager Violence: Not At Risk (11/06/2022)   Humiliation, Afraid, Rape, and Kick questionnaire    Fear of Current or Ex-Partner: No    Emotionally Abused: No    Physically Abused: No    Sexually Abused: No     REVIEW OF SYSTEMS:   [X]  denotes positive finding, [ ]  denotes negative finding Cardiac  Comments:  Chest pain or chest pressure:    Shortness of breath upon exertion:    Short of breath when lying flat:    Irregular heart rhythm:        Vascular    Pain in calf, thigh, or hip brought on by ambulation:    Pain in feet at night that wakes you up from your sleep:     Blood clot in your veins:    Leg swelling:         Pulmonary    Oxygen at home:    Productive cough:     Wheezing:         Neurologic    Sudden weakness in arms or legs:     Sudden numbness in  arms or legs:     Sudden onset of difficulty speaking or slurred speech:    Temporary loss of vision in one eye:     Problems with dizziness:         Gastrointestinal    Blood in stool:     Vomited blood:         Genitourinary    Burning when urinating:     Blood in urine:        Psychiatric    Major depression:         Hematologic    Bleeding problems:    Problems with blood clotting too easily:        Skin    Rashes or ulcers:        Constitutional    Fever or chills:      PHYSICAL EXAMINATION:  Today's Vitals   09/28/23 1204 09/28/23 1208  BP: (!) 154/75 (!) 144/77  Pulse: 62   Resp: 18   Temp: 97.8 F (36.6 C)   TempSrc: Temporal   SpO2: 97%   Weight: 179 lb 11.2 oz (81.5 kg)   Height: 5' 9 (1.753 m)   PainSc: 0-No pain    Body mass index is 26.54 kg/m.   General:  WDWN in NAD; vital signs documented above Gait: Not observed HENT: WNL, normocephalic Pulmonary: normal non-labored breathing Cardiac: regular  HR, without carotid bruits Abdomen: soft, NT; aortic pulse is not palpable Skin: without rashes Vascular Exam/Pulses:  Right Left  Radial 2+ (normal) 2+ (normal)  DP 2+ (normal) 2+ (normal)  PT 2+ (normal) Unable to palpate   Extremities: without open wounds Musculoskeletal: no muscle wasting or atrophy  Neurologic: A&O X 3; moving all extremities equally; speech is fluent/normal Psychiatric:  The pt has Normal affect.   Non-Invasive Vascular Imaging:   Carotid Duplex on 09/28/2023 Right:  normal Left:  1-39% ICA stenosis Vertebrals:  Bilateral vertebral arteries demonstrate antegrade flow.  Subclavians: Normal flow hemodynamics were seen in bilateral subclavian arteries   Previous Carotid duplex on 08/05/2022: Right: normal Left:   1-39% ICA stenosis    ASSESSMENT/PLAN:: 68 y.o. male here for follow up carotid artery stenosis and has hx of right carotid endarterectomy for asymptomatic carotid artery stenosis on 10/21/2020 by Dr.  Gretta.  -duplex today reveals right is normal and left remains 1-39% -discussed s/s of stroke with pt and he understands should he develop any of these sx, he will go to the nearest ER or call 911. -pt will f/u in one year with carotid duplex -pt will call sooner should he have any issues. -continue statin/asa    Lucie Apt, [facility] [phone]  Clinic MD:  [place]da

## 2023-10-19 ENCOUNTER — Encounter: Payer: Self-pay | Admitting: Family Medicine

## 2023-10-22 ENCOUNTER — Encounter: Payer: Self-pay | Admitting: Internal Medicine

## 2023-10-25 ENCOUNTER — Ambulatory Visit

## 2023-10-25 ENCOUNTER — Encounter: Payer: Self-pay | Admitting: Family Medicine

## 2023-10-25 ENCOUNTER — Ambulatory Visit (INDEPENDENT_AMBULATORY_CARE_PROVIDER_SITE_OTHER): Admitting: Family Medicine

## 2023-10-25 VITALS — BP 122/82 | HR 82 | Resp 16 | Ht 69.0 in | Wt 178.4 lb

## 2023-10-25 DIAGNOSIS — M5416 Radiculopathy, lumbar region: Secondary | ICD-10-CM

## 2023-10-25 DIAGNOSIS — M5126 Other intervertebral disc displacement, lumbar region: Secondary | ICD-10-CM | POA: Diagnosis not present

## 2023-10-25 DIAGNOSIS — G8929 Other chronic pain: Secondary | ICD-10-CM | POA: Diagnosis not present

## 2023-10-25 DIAGNOSIS — M47816 Spondylosis without myelopathy or radiculopathy, lumbar region: Secondary | ICD-10-CM | POA: Diagnosis not present

## 2023-10-25 DIAGNOSIS — M79605 Pain in left leg: Secondary | ICD-10-CM

## 2023-10-25 MED ORDER — PREDNISONE 20 MG PO TABS: ORAL_TABLET | ORAL | 0 refills | Status: AC

## 2023-10-25 NOTE — Progress Notes (Signed)
 ACUTE VISIT Chief Complaint  Patient presents with   Leg Pain    Left leg, goes from hip down leg; constant when standing, comes & goes at times when sitting    HPI: Troy [name] is a 68 y.o. male  with PMHx significant for anxiety, GERD, PAD, HTN, and HLD here today complaining of left leg pain ongoing for 6 weeks as described above Associated symptoms include foot numbness, starting the same time as leg pain.  Describes his pain as sharp and rated a 6/10. Radiates down from his left buttock/post hip area laterally down to his foot.  Pain is intermittent when sitting and constant when standing.   He previously injured his back in 08/2023 and states his leg pain started after his back pain resolved.  Denies any fever, chills,abdominal pain, saddle anesthesia, urinary/fecal incontinence.   Review of Systems  Constitutional:  Negative for activity change and appetite change.  HENT:  Negative for sore throat.   Respiratory:  Negative for cough and shortness of breath.   Cardiovascular:  Negative for chest pain and leg swelling.  Gastrointestinal:  Negative for nausea and vomiting.  Genitourinary:  Negative for decreased urine volume, dysuria and hematuria.  Skin:  Negative for rash.  Neurological:  Negative for syncope, facial asymmetry and weakness.  See other pertinent positives and negatives in HPI.  Current Outpatient Medications on File Prior to Visit  Medication Sig Dispense Refill   ascorbic acid (VITAMIN C) 500 MG tablet Take 500 mg by mouth daily. Takes 2x/week     aspirin  EC 81 MG tablet Take 81 mg by mouth daily. Swallow whole.     atorvastatin  (LIPITOR) 40 MG tablet TAKE 1 TABLET BY MOUTH EVERY DAY FOR HIGH CHOLESTEROL 90 tablet 3   busPIRone  (BUSPAR ) 10 MG tablet Take 1 tablet (10 mg total) by mouth 3 (three) times daily. 90 tablet 1   cetirizine (ZYRTEC) 10 MG tablet Take 10 mg by mouth as needed for allergies.     hyoscyamine  (LEVSIN  SL) 0.125 MG SL tablet Take  1 tablet (0.125 mg total) by mouth every 8 (eight) hours as needed. 30 tablet 2   ibuprofen  (ADVIL ) 200 MG tablet Take 200 mg by mouth as needed.     losartan  (COZAAR ) 50 MG tablet Take 1 tablet (50 mg total) by mouth daily. 90 tablet 1   omeprazole  (PRILOSEC) 40 MG capsule TAKE 1 CAPSULE (40 MG TOTAL) BY MOUTH DAILY. 90 capsule 1   Polyethyl Glycol-Propyl Glycol (SYSTANE OP) Place 1 drop into both eyes daily.     tadalafil (CIALIS) 5 MG tablet Take 5 mg by mouth as needed for erectile dysfunction.     No current facility-administered medications on file prior to visit.    Past Medical History:  Diagnosis Date   Allergy    Seasonal   Anxiety    Aortic stenosis 05/19/2018   Mild by echo 04/2018, mean grad   Arthritis    back   Cancer [facility])    prostate cancer   Complication of anesthesia    Diverticula of colon    left side.   GERD (gastroesophageal reflux disease)    food related   Herniated disc    3 herniated discs in neck   History of kidney stones    Hyperlipidemia 09/30/2022   Hypertension    Left bundle branch block    Noted since 2016 on EKG   PONV (postoperative nausea and vomiting)    Seasonal allergies  Allergies  Allergen Reactions   Penicillins Other (See Comments)    Did it involve swelling of the face/tongue/throat, SOB, or low BP? No Did it involve sudden or severe rash/hives, skin peeling, or any reaction on the inside of your mouth or nose? No Did you need to seek medical attention at a hospital or doctor's office? No When did it last happen?      Over 10 years If all above answers are "NO", may proceed with cephalosporin use.    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[place]   Tobacco Use   Smoking status: Never    Passive exposure: Never   Smokeless tobacco: Never  Vaping Use   Vaping status: Never Used  Substance and Sexual Activity   Alcohol  use: No   Drug use: No   Sexual activity: Yes  Other Topics Concern   Not on file  Social History Narrative   Not on file   Social Drivers of Health   Financial Resource Strain: Low Risk  (10/23/2023)   Overall Financial Resource Strain (CARDIA)    Difficulty of Paying Living Expenses: Not very hard  Food Insecurity: No Food Insecurity (10/23/2023)   Hunger Vital Sign    Worried About Running Out of Food in the Last Year: Never true    Ran Out of Food in the Last Year: Never true  Transportation Needs: No Transportation Needs (10/23/2023)   PRAPARE - Administrator, Civil Service (Medical): No    Lack of Transportation (Non-Medical): No  Physical Activity: Sufficiently Active (10/23/2023)   Exercise Vital Sign    Days of Exercise per Week: 3 days    Minutes of Exercise per Session: 60 min  Stress: No Stress Concern Present (10/23/2023)   [employer] of Occupational Health - Occupational Stress Questionnaire    Feeling of Stress: Only a little  Social Connections: Socially Integrated (10/23/2023)   Social Connection and Isolation Panel    Frequency of Communication with Friends and Family: More than three times a week    Frequency of Social Gatherings with Friends and Family: Twice a week    Attends Religious Services: More than 4 times per year    Active Member of [employer] or Organizations: Yes    Attends Banker Meetings: More than 4 times per year    Marital Status: Married    Vitals:   10/25/23 0948  BP: 122/82  Pulse: 82  Resp: 16  SpO2: 97%   Body mass index is 26.34 kg/m.  Physical Exam Vitals and nursing note reviewed.  Constitutional:      General: He is not in acute  distress.    Appearance: He is well-developed.  HENT:     Head: Normocephalic and atraumatic.     Mouth/Throat:     Pharynx: Uvula midline.  Eyes:     Conjunctiva/sclera: Conjunctivae normal.  Cardiovascular:     Rate and Rhythm: Normal rate and regular rhythm.     Pulses:          Dorsalis pedis pulses are 2+ on the right side  and 2+ on the left side.     Heart sounds: Murmur (SEM I-II/VI LUSB) heard.  Pulmonary:     Effort: Pulmonary effort is normal. No respiratory distress.     Breath sounds: Normal breath sounds.  Abdominal:     Palpations: Abdomen is soft. There is no hepatomegaly or mass.     Tenderness: There is no abdominal tenderness.  Musculoskeletal:     Lumbar back: No tenderness. Positive left straight leg raise test. Negative right straight leg raise test.     Right lower leg: No edema.     Left lower leg: No edema.  Skin:    General: Skin is warm.     Findings: Ecchymosis (one on left forearm.) present. No erythema or rash.  Neurological:     General: No focal deficit present.     Mental Status: He is alert and oriented to person, place, and time.     Gait: Gait normal.  Psychiatric:        Mood and Affect: Mood and affect normal.   ASSESSMENT AND PLAN: Mr. PATRICH HEINZE was seen today for left leg pain.  Pain of left lower extremity We discussed possible etiologies. Peripheral pulses palpable. ? Radicular.  We discussed some side effects of prednisone , he has tolerated medication well in the past. If problem does not resolve in 4 weeks, lumbar MRI will be arranged. Instructed about warning signs.  -     DG Lumbar Spine Complete; Future -     predniSONE ; 3 tabs for 3 days, 2 tabs for 3 days, 1 tabs for 3 days, and 1/2 tab for 3 days. Take tables together with breakfast.  Dispense: 20 tablet; Refill: 0  Lumbar radiculopathy He would like to have a lumbar x-ray done here in the office today. Prednisone  taper recommended. Monitor for new  symptoms. If not resolved, lumbar MRI will be considered.  -     DG Lumbar Spine Complete; Future -     predniSONE ; 3 tabs for 3 days, 2 tabs for 3 days, 1 tabs for 3 days, and 1/2 tab for 3 days. Take tables together with breakfast.  Dispense: 20 tablet; Refill: 0  Return if symptoms worsen or fail to improve, for keep next appointment.  I, Vernell Forest, acting as a scribe for Nataliee Shurtz [place], MD., have documented all relevant documentation on the behalf of Willmar Stockinger [place], MD, as directed by   while in the presence of Doye Montilla [place], MD.  I, Macaela Presas [place], MD, have reviewed all documentation for this visit. The documentation on 10/25/23 for the exam, diagnosis, procedures, and orders are all accurate and complete.  Esco Joslyn G. [place], MD  [facility]. [place] office.

## 2023-10-25 NOTE — Patient Instructions (Addendum)
 A few things to remember from today's visit:  Pain of left lower extremity - Plan: DG Lumbar Spine Complete  Lumbar radiculopathy - Plan: DG Lumbar Spine Complete  Take prednisone  with breakfast. If not gone in 4 weeks we can arrange lumbar MRI and ortho evaluation.  If you need refills for medications you take chronically, please call your pharmacy. Do not use My Chart to request refills or for acute issues that need immediate attention. If you send a my chart message, it may take a few days to be addressed, specially if I am not in the office.  Please be sure medication list is accurate. If a new problem present, please set up appointment sooner than planned today.

## 2023-10-29 ENCOUNTER — Encounter: Payer: Self-pay | Admitting: Family Medicine

## 2023-10-29 ENCOUNTER — Telehealth: Admitting: Family Medicine

## 2023-10-29 ENCOUNTER — Ambulatory Visit: Payer: Self-pay

## 2023-10-29 VITALS — Ht 69.0 in

## 2023-10-29 DIAGNOSIS — M545 Low back pain, unspecified: Secondary | ICD-10-CM | POA: Diagnosis not present

## 2023-10-29 DIAGNOSIS — M79605 Pain in left leg: Secondary | ICD-10-CM | POA: Diagnosis not present

## 2023-10-29 MED ORDER — TRAMADOL HCL 50 MG PO TABS: 50.0000 mg | ORAL_TABLET | Freq: Two times a day (BID) | ORAL | 0 refills | Status: AC | PRN

## 2023-10-29 MED ORDER — MELOXICAM 7.5 MG PO TABS: 7.5000 mg | ORAL_TABLET | Freq: Every day | ORAL | 0 refills | Status: DC | PRN

## 2023-10-29 NOTE — Telephone Encounter (Signed)
 Pt states yesterday he was bending over and get hangers and when he straightened up he felt pain in his lower back. States he was told not to take any pain medications with the prednisone , so he is questioning what to do for the pain. States that the pain is better in his legs since starting prednisone .   Please advise.

## 2023-10-29 NOTE — Telephone Encounter (Signed)
 FYI Only or Action Required?: Action required by provider: clinical question for provider and update on patient condition.  Patient was last seen in primary care on 10/25/2023 by [place], Betty G, MD.  Called Nurse Triage reporting Back Pain.  Symptoms began yesterday.  Interventions attempted: Prescription medications: prednisone .  Symptoms are: unchanged.  Triage Disposition: Home Care  Patient/caregiver understands and will follow disposition?: Yes    Copied from CRM [id number]. Topic: Clinical - Red Word Triage >> Oct 29, 2023 10:44 AM Robinson DEL wrote: [facility] that prompted transfer to Nurse Triage: Pain in lower back across whole back really bad, was in on Monday for left leg pain and was given predniSONE  (DELTASONE ) 20 MG tablet Reason for Disposition  Caused by a twisting, bending, or lifting injury  Answer Assessment - Initial Assessment Questions 1. ONSET: When did the pain begin? (e.g., minutes, hours, days)     yesterday 2. LOCATION: Where does it hurt? (upper, mid or lower back)     Lower back 3. SEVERITY: How bad is the pain?  (e.g., Scale 1-10; mild, moderate, or severe)     8/10 4. PATTERN: Is the pain constant? (e.g., yes, no; constant, intermittent)      Constant, but worse when getting up and down and walking.  5. RADIATION: Does the pain shoot into your legs or somewhere else?     Pain in left leg is better 6. CAUSE:  What do you think is causing the back pain?      Had injury in June 2025 7. BACK OVERUSE:  Any recent lifting of heavy objects, strenuous work or exercise?     no 8. MEDICINES: What have you taken so far for the pain? (e.g., nothing, acetaminophen , NSAIDS)     Prednisone   9. NEUROLOGIC SYMPTOMS: Do you have any weakness, numbness, or problems with bowel/bladder control?     no 10. OTHER SYMPTOMS: Do you have any other symptoms? (e.g., fever, abdomen pain, burning with urination, blood in urine)       no  Protocols used:  Back Pain-A-AH

## 2023-10-29 NOTE — Telephone Encounter (Signed)
 Scheduled pt for VV with PCP.

## 2023-10-29 NOTE — Progress Notes (Signed)
 Virtual Visit via Video Note I connected with Troy [name] on 10/29/2023 by a video enabled telemedicine application and verified that I am speaking with the correct person using two identifiers. Location patient: home Location provider:work office Persons participating in the virtual visit: patient, provider, scribe  I discussed the limitations of evaluation and management by telemedicine and the availability of in person appointments. The patient expressed understanding and agreed to proceed.  Chief Complaint  Patient presents with   Back Pain   Discussed the use of AI scribe software for clinical note transcription with the patient, who gave verbal consent to proceed. History of Present Illness Troy [name] is a 68 year old male with PMHx significant for anxiety, GERD, PAD, HTN, and HLD degenerative disc disease who presents with worsening back pain.  He has been experiencing worsening back pain since yesterday afternoon after bending to retrieve clothes racks from a closet. The pain is localized to the lower right side of his back, similar to an episode in June. He describes the pain as an 'eight' on a scale of ten, and it has been affecting his sleep, causing a rough night.  He has a history of degenerative changes in the lumbar spine, specifically between L4 and L5, noted in an MRI from 2013.   Recent lumbar x-rays were performed, but the patient has not yet received the official results.  He has been taking prednisone  since Monday, 7/28, recommended  for left leg pain, which initially improved but then his back pain worsened. He has not taken ibuprofen  since starting prednisone  due to concerns about stomach irritation.  He is allergic to acetaminophen , which causes a rash, and has previously taken hydrocodone  without acetaminophen  for pain management.   Left LE pain has improved with prednisone  but has intermittently returned as his back pain fluctuated.  Right lower back is  sometimes radiating down his right leg when standing or walking.  ROS: See pertinent positives and negatives per HPI.  Past Medical History:  Diagnosis Date   Allergy    Seasonal   Anxiety    Aortic stenosis 05/19/2018   Mild by echo 04/2018, [facility]   Arthritis    back   Cancer [facility])    prostate cancer   Complication of anesthesia    Diverticula of colon    left side.   GERD (gastroesophageal reflux disease)    food related   Herniated disc    3 herniated discs in neck   History of kidney stones    Hyperlipidemia 09/30/2022   Hypertension    Left bundle branch block    Noted since 2016 on EKG   PONV (postoperative nausea and vomiting)    Seasonal allergies     Past Surgical History:  Procedure Laterality Date   BIOPSY  07/27/2018   Procedure: BIOPSY;  Surgeon: Shaaron Lamar HERO, MD;  Location: [facility];  Service: Endoscopy;;   COLONOSCOPY  01/09/2008   Dr.Rourk- normal rectum, L sided diverticulum. colonic mucosa and terminal ileum mucosa appeared normal.   COLONOSCOPY  1996   colonic adenomas   COLONOSCOPY  2001   L sided diverticula   COLONOSCOPY N/A 02/12/2014   Dr. Shaaron: diverticulosis   COLONOSCOPY N/A 07/27/2018   Procedure: COLONOSCOPY;  Surgeon: Shaaron Lamar HERO, MD;  Location: [facility];  Service: Endoscopy;  Laterality: N/A;  2:00pm   ENDARTERECTOMY Right 10/21/2020   Procedure: RIGHT CAROTID ENDARTERECTOMY;  Surgeon: Gretta Lonni PARAS, MD;  Location: [facility]  OR;  Service: Vascular;  Laterality: Right;   HERNIA REPAIR Left 2008   LITHOTRIPSY     x2   NECK SURGERY     PATCH ANGIOPLASTY Right 10/21/2020   Procedure: PATCH ANGIOPLASTY USING GEORGE BIOLOGIC PATCH;  Surgeon: Gretta Lonni PARAS, MD;  Location[facility] OR;  Service: Vascular;  Laterality: Right;   ROBOT ASSISTED LAPAROSCOPIC RADICAL PROSTATECTOMY N/A 01/25/2013   Procedure: ROBOTIC ASSISTED LAPAROSCOPIC RADICAL PROSTATECTOMY LEVEL 1;  Surgeon: Toribio Neysa Repine, MD;  Location:  [facility] ORS;  Service: Urology;  Laterality: N/A;   SPINE SURGERY  2017 - 2018   Neck surgery    Family History  Problem Relation Age of Onset   Dementia Mother    CAD Father    Prostate cancer Father    Cancer Father    Heart disease Father    Hypertension Brother    Hyperlipidemia Brother    Diabetes Brother    Prostate cancer Paternal Uncle    CAD Paternal Aunt    Kidney cancer Paternal Aunt    Cancer Paternal Aunt    Dementia Maternal Grandmother    Colon cancer Neg Hx    Colon polyps Neg Hx     Social History   Socioeconomic History   Marital status: Married    Spouse name: Troy [name]   Number of children: 2   Years of education: Not on file   Highest education level: 12th grade  Occupational History   Occupation: retired    Comment: Optician, 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[place]   Tobacco Use   Smoking status: Never    Passive exposure: Never   Smokeless tobacco: Never  Vaping Use   Vaping status: Never Used  Substance and Sexual Activity   Alcohol  use: No   Drug use: No   Sexual activity: Yes  Other Topics Concern   Not on file  Social History Narrative   Not on file   Social Drivers of Health   Financial Resource Strain: Low Risk  (10/23/2023)   Overall Financial Resource Strain (CARDIA)    Difficulty of Paying Living Expenses: Not very hard  Food Insecurity: No Food Insecurity (10/23/2023)   Hunger Vital Sign    Worried About Running Out of Food in the Last Year: Never true    Ran Out of Food in the Last Year: Never true  Transportation Needs: No Transportation Needs (10/23/2023)   PRAPARE - Administrator, Civil Service (Medical): No    Lack of Transportation (Non-Medical): No  Physical Activity: Sufficiently Active (10/23/2023)   Exercise Vital Sign    Days of Exercise per Week: 3 days    Minutes of Exercise per Session: 60 min  Stress: No Stress Concern Present (10/23/2023)   [employer] of Occupational Health - Occupational Stress Questionnaire     Feeling of Stress: Only a little  Social Connections: Socially Integrated (10/23/2023)   Social Connection and Isolation Panel    Frequency of Communication with Friends and Family: More than three times a week    Frequency of Social Gatherings with Friends and Family: Twice a week    Attends Religious Services: More than 4 times per year    Active Member of [employer] or Organizations: Yes    Attends Banker Meetings: More than 4 times per year    Marital Status: Married  Catering manager Violence: Not At Risk (11/06/2022)   Humiliation, Afraid, Rape, and Kick questionnaire    Fear of Current or Ex-Partner: No  Emotionally Abused: No    Physically Abused: No    Sexually Abused: No    Current Outpatient Medications:    ascorbic acid (VITAMIN C) 500 MG tablet, Take 500 mg by mouth daily. Takes 2x/week, Disp: , Rfl:    aspirin  EC 81 MG tablet, Take 81 mg by mouth daily. Swallow whole., Disp: , Rfl:    atorvastatin  (LIPITOR) 40 MG tablet, TAKE 1 TABLET BY MOUTH EVERY DAY FOR HIGH CHOLESTEROL, Disp: 90 tablet, Rfl: 3   busPIRone  (BUSPAR ) 10 MG tablet, Take 1 tablet (10 mg total) by mouth 3 (three) times daily., Disp: 90 tablet, Rfl: 1   cetirizine (ZYRTEC) 10 MG tablet, Take 10 mg by mouth as needed for allergies., Disp: , Rfl:    hyoscyamine  (LEVSIN  SL) 0.125 MG SL tablet, Take 1 tablet (0.125 mg total) by mouth every 8 (eight) hours as needed., Disp: 30 tablet, Rfl: 2   losartan  (COZAAR ) 50 MG tablet, Take 1 tablet (50 mg total) by mouth daily., Disp: 90 tablet, Rfl: 1   meloxicam (MOBIC) 7.5 MG tablet, Take 1-2 tablets (7.5-15 mg total) by mouth daily as needed for pain., Disp: 30 tablet, Rfl: 0   omeprazole  (PRILOSEC) 40 MG capsule, TAKE 1 CAPSULE (40 MG TOTAL) BY MOUTH DAILY., Disp: 90 capsule, Rfl: 1   Polyethyl Glycol-Propyl Glycol (SYSTANE OP), Place 1 drop into both eyes daily., Disp: , Rfl:    predniSONE  (DELTASONE ) 20 MG tablet, 3 tabs for 3 days, 2 tabs for 3 days, 1 tabs  for 3 days, and 1/2 tab for 3 days. Take tables together with breakfast., Disp: 20 tablet, Rfl: 0   tadalafil (CIALIS) 5 MG tablet, Take 5 mg by mouth as needed for erectile dysfunction., Disp: , Rfl:    traMADol (ULTRAM) 50 MG tablet, Take 1 tablet (50 mg total) by mouth every 12 (twelve) hours as needed for up to 7 days., Disp: 14 tablet, Rfl: 0  EXAM:  VITALS per patient if applicable:Ht 5' 9 (1.753 m)   BMI 26.34 kg/m   GENERAL: alert, oriented, appears well and in no acute distress  HEENT: atraumatic, conjunctiva clear, no obvious abnormalities on inspection of external nose and ears  NECK: normal movements of the head and neck  LUNGS: on inspection no signs of respiratory distress, breathing rate appears normal, no obvious gross SOB, gasping or wheezing  CV: no obvious cyanosis  MS: moves all visible extremities without noticeable abnormality  PSYCH/NEURO: pleasant and cooperative, no obvious depression or anxiety, speech and thought processing grossly intact  ASSESSMENT AND PLAN: Troy [name] was seen today for right lower back pain.  Discussed the following assessment and plan:  Right low back pain, unspecified chronicity, unspecified whether sciatica present Problem has been recurrent since 08/2023. Lumbar X ray done recently, report is pending. I see degenerative changes, no bone lesion. Currently on Prednisone . Recommend Meloxicam 7.5-15 mg daily for 7-10 days then prn. Tramadol 50 mg to take mainly at bed time. We discussed some side effects of medications. He prefers to hold on PT or ortho referral.  -     Meloxicam; Take 1-2 tablets (7.5-15 mg total) by mouth daily as needed for pain.  Dispense: 30 tablet; Refill: 0 -     traMADol HCl; Take 1 tablet (50 mg total) by mouth every 12 (twelve) hours as needed for up to 7 days.  Dispense: 14 tablet; Refill: 0  Pain of left lower extremity It has improved. Complete prednisone  treatment. If not  resolved in  4-6 weeks, lumbar MRI will be considered.  We discussed possible serious and likely etiologies, options for evaluation and workup, limitations of telemedicine visit vs in person visit, treatment, treatment risks and precautions. The patient was advised to call back or seek an in-person evaluation if the symptoms worsen or if the condition fails to improve as anticipated. I discussed the assessment and treatment plan with the patient. The patient was provided an opportunity to ask questions and all were answered. The patient agreed with the plan and demonstrated an understanding of the instructions.  Return if symptoms worsen or fail to improve, for keep next appointment.  I, Vernell Forest, acting as a scribe for Betty Swazil[place], MD., have documented all relevant documentation on the behalf of Betty [place], MD, as directed by   while in the presence of Betty [place], MD.  I, Betty [place], MD, have reviewed all documentation for this visit. The documentation on 10/29/23 for the exam, diagnosis, procedures, and orders are all accurate and complete.   Betty [place], MD

## 2023-10-29 NOTE — Telephone Encounter (Signed)
 He can take OTC analgesics, I recommended not to take NSAIDs at the same time that the prednisone  but he can it a few hours after and with food. Thanks, BJ

## 2023-10-29 NOTE — Telephone Encounter (Signed)
 Patient called in to ask if provider suggested anything for his back pain that he is experiencing? I relayed message from provider and he said that he do not think any OTC pain medication would help him because the pain is at a 8 out of 10.He would like to be prescribed some pain medication.

## 2023-10-30 ENCOUNTER — Ambulatory Visit: Payer: Self-pay | Admitting: Family Medicine

## 2023-10-30 ENCOUNTER — Other Ambulatory Visit: Payer: Self-pay | Admitting: Family Medicine

## 2023-10-30 DIAGNOSIS — M5416 Radiculopathy, lumbar region: Secondary | ICD-10-CM

## 2023-11-01 ENCOUNTER — Encounter: Payer: Self-pay | Admitting: Family Medicine

## 2023-11-04 ENCOUNTER — Ambulatory Visit
Admission: RE | Admit: 2023-11-04 | Discharge: 2023-11-04 | Disposition: A | Discharge status: Home / Self Care | Source: Ambulatory Visit | Attending: Family Medicine | Admitting: Family Medicine

## 2023-11-04 DIAGNOSIS — M4726 Other spondylosis with radiculopathy, lumbar region: Secondary | ICD-10-CM | POA: Diagnosis not present

## 2023-11-04 DIAGNOSIS — M5416 Radiculopathy, lumbar region: Secondary | ICD-10-CM

## 2023-11-04 DIAGNOSIS — M5117 Intervertebral disc disorders with radiculopathy, lumbosacral region: Secondary | ICD-10-CM | POA: Diagnosis not present

## 2023-11-04 DIAGNOSIS — M48061 Spinal stenosis, lumbar region without neurogenic claudication: Secondary | ICD-10-CM | POA: Diagnosis not present

## 2023-11-10 ENCOUNTER — Ambulatory Visit: Payer: Medicare HMO

## 2023-11-10 VITALS — Ht 69.0 in | Wt 178.0 lb

## 2023-11-10 DIAGNOSIS — Z0000 Encounter for general adult medical examination without abnormal findings: Secondary | ICD-10-CM | POA: Diagnosis not present

## 2023-11-10 NOTE — Progress Notes (Signed)
 Subjective:   Troy [name] is a 68 y.o. who presents for a Medicare Wellness preventive visit.  As a reminder, Annual Wellness Visits don't include a physical exam, and some assessments may be limited, especially if this visit is performed virtually. We may recommend an in-person follow-up visit with your provider if needed.  Visit Complete: Virtual I connected with  [name] on 11/10/23 by a audio enabled telemedicine application and verified that I am speaking with the correct person using two identifiers.  Patient Location: Home  Provider Location: Home Office  I discussed the limitations of evaluation and management by telemedicine. The patient expressed understanding and agreed to proceed.  Vital Signs: Because this visit was a virtual/telehealth visit, some criteria may be missing or patient reported. Any vitals not documented were not able to be obtained and vitals that have been documented are patient reported.    Persons Participating in Visit: Patient.  AWV Questionnaire: Yes: Patient Medicare AWV questionnaire was completed by the patient on 11/09/23; I have confirmed that all information answered by patient is correct and no changes since this date.  Cardiac Risk Factors include: advanced age (>55men, >51 women);male gender;hypertension     Objective:    Today's Vitals   11/10/23 1304  Weight: 178 lb (80.7 kg)  Height: 5' 9 (1.753 m)   Body mass index is 26.29 kg/m.     11/10/2023    1:09 PM 12/02/2022   11:01 PM 11/06/2022    1:14 PM 11/12/2020   10:21 AM 10/21/2020    8:00 PM 10/17/2020   10:25 AM 10/21/2018   10:10 AM  Advanced Directives  Does Patient Have a Medical Advance Directive? No No No No No No No  Would patient like information on creating a medical advance directive? No - Patient declined  No - Patient declined  No - Patient declined No - Patient declined No - Patient declined      Data saved with a previous flowsheet row definition     Current Medications (verified) Outpatient Encounter Medications as of 11/10/2023  Medication Sig   ascorbic acid (VITAMIN C) 500 MG tablet Take 500 mg by mouth daily. Takes 2x/week   aspirin  EC 81 MG tablet Take 81 mg by mouth daily. Swallow whole.   atorvastatin  (LIPITOR) 40 MG tablet TAKE 1 TABLET BY MOUTH EVERY DAY FOR HIGH CHOLESTEROL   busPIRone  (BUSPAR ) 10 MG tablet Take 1 tablet (10 mg total) by mouth 3 (three) times daily.   cetirizine (ZYRTEC) 10 MG tablet Take 10 mg by mouth as needed for allergies.   hyoscyamine  (LEVSIN  SL) 0.125 MG SL tablet Take 1 tablet (0.125 mg total) by mouth every 8 (eight) hours as needed.   losartan  (COZAAR ) 50 MG tablet Take 1 tablet (50 mg total) by mouth daily.   meloxicam  (MOBIC ) 7.5 MG tablet Take 1-2 tablets (7.5-15 mg total) by mouth daily as needed for pain.   omeprazole  (PRILOSEC) 40 MG capsule TAKE 1 CAPSULE (40 MG TOTAL) BY MOUTH DAILY.   Polyethyl Glycol-Propyl Glycol (SYSTANE OP) Place 1 drop into both eyes daily.   tadalafil (CIALIS) 5 MG tablet Take 5 mg by mouth as needed for erectile dysfunction.   No facility-administered encounter medications on file as of 11/10/2023.    Allergies (verified) Penicillins, Ciprofloxacin , Darvocet [propoxyphene n-acetaminophen ], and Tylenol  [acetaminophen ]   History: Past Medical History:  Diagnosis Date   Allergy    Seasonal   Anxiety    Aortic stenosis 05/19/2018  Mild by echo 04/2018, [facility]   Arthritis    back   Cancer [facility])    prostate cancer   Complication of anesthesia    Diverticula of colon    left side.   GERD (gastroesophageal reflux disease)    food related   Herniated disc    3 herniated discs in neck   History of kidney stones    Hyperlipidemia 09/30/2022   Hypertension    Left bundle branch block    Noted since 2016 on EKG   PONV (postoperative nausea and vomiting)    Seasonal allergies    Past Surgical History:  Procedure Laterality Date   BIOPSY   07/27/2018   Procedure: BIOPSY;  Surgeon: Shaaron Lamar HERO, MD;  Location: [facility];  Service: Endoscopy;;   COLONOSCOPY  01/09/2008   Dr.Rourk- normal rectum, L sided diverticulum. colonic mucosa and terminal ileum mucosa appeared normal.   COLONOSCOPY  1996   colonic adenomas   COLONOSCOPY  2001   L sided diverticula   COLONOSCOPY N/A 02/12/2014   Dr. Shaaron: diverticulosis   COLONOSCOPY N/A 07/27/2018   Procedure: COLONOSCOPY;  Surgeon: Shaaron Lamar HERO, MD;  Location: [facility];  Service: Endoscopy;  Laterality: N/A;  2:00pm   ENDARTERECTOMY Right 10/21/2020   Procedure: RIGHT CAROTID ENDARTERECTOMY;  Surgeon: Gretta Lonni PARAS, MD;  Location[facility];  Service: Vascular;  Laterality: Right;   HERNIA REPAIR Left 2008   LITHOTRIPSY     x2   NECK SURGERY     PATCH ANGIOPLASTY Right 10/21/2020   Procedure: PATCH ANGIOPLASTY USING GEORGE BIOLOGIC PATCH;  Surgeon: Gretta Lonni PARAS, MD;  Location: [facility] OR;  Service: Vascular;  Laterality: Right;   ROBOT ASSISTED LAPAROSCOPIC RADICAL PROSTATECTOMY N/A 01/25/2013   Procedure: ROBOTIC ASSISTED LAPAROSCOPIC RADICAL PROSTATECTOMY LEVEL 1;  Surgeon: Toribio Neysa Repine, MD;  Location: [facility] ORS;  Service: Urology;  Laterality: N/A;   SPINE SURGERY  2017 - 2018   Neck surgery   Family History  Problem Relation Age of Onset   Dementia Mother    CAD Father    Prostate cancer Father    Cancer Father    Heart disease Father    Hypertension Brother    Hyperlipidemia Brother    Diabetes Brother    Prostate cancer Paternal Uncle    CAD Paternal Aunt    Kidney cancer Paternal Aunt    Cancer Paternal Aunt    Dementia Maternal Grandmother    Colon cancer Neg Hx    Colon polyps Neg Hx    Social History   Socioeconomic History   Marital status: Married    Spouse name: Baily Serpe   Number of children: 2   Years of education: Not on file   Highest education level: 12th grade  Occupational History   Occupation: retired     Comment: Optician, 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[place]   Tobacco Use   Smoking status: Never    Passive exposure: Never   Smokeless tobacco: Never  Vaping Use   Vaping status: Never Used  Substance and Sexual Activity   Alcohol  use: No   Drug use: No   Sexual activity: Yes  Other Topics Concern   Not on file  Social History Narrative   Not on file   Social Drivers of Health   Financial Resource Strain: Low Risk  (11/10/2023)   Overall Financial Resource Strain (CARDIA)    Difficulty of Paying Living Expenses: Not hard at all  Food Insecurity: No Food  Insecurity (11/10/2023)   Hunger Vital Sign    Worried About Running Out of Food in the Last Year: Never true    Ran Out of Food in the Last Year: Never true  Transportation Needs: No Transportation Needs (11/10/2023)   PRAPARE - Administrator, Civil Service (Medical): No    Lack of Transportation (Non-Medical): No  Physical Activity: Sufficiently Active (11/10/2023)   Exercise Vital Sign    Days of Exercise per Week: 3 days    Minutes of Exercise per Session: 50 min  Stress: No Stress Concern Present (11/10/2023)   [employer] of Occupational Health - Occupational Stress Questionnaire    Feeling of Stress: Not at all  Social Connections: Socially Integrated (11/10/2023)   Social Connection and Isolation Panel    Frequency of Communication with Friends and Family: More than three times a week    Frequency of Social Gatherings with Friends and Family: More than three times a week    Attends Religious Services: More than 4 times per year    Active Member of [employer] or Organizations: Yes    Attends Engineer, structural: More than 4 times per year    Marital Status: Married    Tobacco Counseling Counseling given: Not Answered    Clinical Intake:  Pre-visit preparation completed: Yes  Pain : No/denies pain     BMI - recorded: 26.29 Nutritional Status: BMI 25 -29 Overweight Nutritional Risks: None Diabetes:  No  Lab Results  Component Value Date   HGBA1C 6.0 08/31/2023   HGBA1C 5.6 01/01/2023     How often do you need to have someone help you when you read instructions, pamphlets, or other written materials from your doctor or pharmacy?: 1 - Never  Interpreter Needed?: No  Information entered by :: Rojelio Blush LPN   Activities of Daily Living      11/10/2023    1:08 PM 11/09/2023    1:21 PM  In your present state of health, do you have any difficulty performing the following activities:  Hearing? 0 0  Vision? 0 0  Difficulty concentrating or making decisions? 0 0  Walking or climbing stairs? 0 0  Dressing or bathing? 0 0  Doing errands, shopping? 0 0  Preparing Food and eating ? N N  Using the Toilet? N N  In the past six months, have you accidently leaked urine? N N  Do you have problems with loss of bowel control? N N  Managing your Medications? N N  Managing your Finances? N N  Housekeeping or managing your Housekeeping? N N    Patient Care Team: [place], Betty G, MD as PCP - General (Family Medicine) Alvan Dorn FALCON, MD as Consulting Physician (Cardiology)   I have updated your Care Teams any recent Medical Services you may have received from other providers in the past year.     Assessment:   This is a routine wellness examination for Troy [name].  Hearing/Vision screen Hearing Screening - Comments:: Denies hearing difficulties   Vision Screening - Comments:: Wears rx glasses - up to date with routine eye exams with  [facility]   Goals Addressed               This Visit's Progress     Increase physical activity (pt-stated)        Remain active.       Depression Screen      11/10/2023    1:07 PM 03/02/2023  10:22 AM 01/18/2023   11:48 AM 11/06/2022    1:13 PM 09/30/2022   10:57 AM 11/12/2020   10:21 AM  PHQ 2/9 Scores  PHQ - 2 Score 0 0 2 0 0 0  PHQ- 9 Score  2 6       Fall Risk      11/10/2023    1:09 PM 11/09/2023    1:21 PM 02/26/2023     8:54 AM 11/06/2022    1:14 PM 11/02/2022    9:57 AM  Fall Risk   Falls in the past year? 0 0 0 0 0  Number falls in past yr: 0   0   Injury with Fall? 0   0   Risk for fall due to : No Fall Risks   No Fall Risks   Follow up Falls evaluation completed   Falls prevention discussed     MEDICARE RISK AT HOME:   Medicare Risk at Home Any stairs in or around the home?: No If so, are there any without handrails?: No Home free of loose throw rugs in walkways, pet beds, electrical cords, etc?: Yes Adequate lighting in your home to reduce risk of falls?: Yes Life alert?: No Use of a cane, walker or w/c?: No Grab bars in the bathroom?: No Shower chair or bench in shower?: Yes Elevated toilet seat or a handicapped toilet?: No  TIMED UP AND GO:  Was the test performed?  No  Cognitive Function: 6CIT completed        11/10/2023    1:09 PM 11/06/2022    1:15 PM  6CIT Screen  What Year? 0 points 0 points  What month? 0 points 0 points  What time? 0 points 0 points  Count back from 20 0 points 0 points  Months in reverse 0 points 0 points  Repeat phrase 0 points 0 points  Total Score 0 points 0 points    Immunizations Immunization History  Administered Date(s) Administered   Influenza, Seasonal, Injecte, Preservative Fre 01/01/2023   Moderna Covid-19 Vaccine Bivalent Booster 64yrs & up 03/28/2021   Moderna Sars-Covid-2 Vaccination 06/28/2019, 07/24/2019, 04/02/2020   PNEUMOCOCCAL CONJUGATE-20 03/02/2023   Zoster Recombinant(Shingrix) 04/27/2023, 07/14/2023    Screening Tests Health Maintenance  Topic Date Due   DTaP/Tdap/Td (1 - Tdap) Never done   COVID-19 Vaccine (5 - 2024-25 season) 11/29/2022   INFLUENZA VACCINE  10/29/2023   Medicare Annual Wellness (AWV)  11/09/2024   Colonoscopy  07/26/2028   Pneumococcal Vaccine: 50+ Years  Completed   Hepatitis C Screening  Completed   HIV Screening  Completed   Zoster Vaccines- Shingrix  Completed   Hepatitis B Vaccines  Aged  Out   HPV VACCINES  Aged Out   Meningococcal B Vaccine  Aged Out    Health Maintenance  Health Maintenance Due  Topic Date Due   DTaP/Tdap/Td (1 - Tdap) Never done   COVID-19 Vaccine (5 - 2024-25 season) 11/29/2022   INFLUENZA VACCINE  10/29/2023   Health Maintenance Items Addressed:   Additional Screening:  Vision Screening: Recommended annual ophthalmology exams for early detection of glaucoma and other disorders of the eye. Would you like a referral to an eye doctor? No    Dental Screening: Recommended annual dental exams for proper oral hygiene  Community Resource Referral / Chronic Care Management: CRR required this visit?  No   CCM required this visit?  No   Plan:    I have personally reviewed and noted the following in  the patient's chart:   Medical and social history Use of alcohol , tobacco or illicit drugs  Current medications and supplements including opioid prescriptions. Patient is not currently taking opioid prescriptions. Functional ability and status Nutritional status Physical activity Advanced directives List of other physicians Hospitalizations, surgeries, and ER visits in previous 12 months Vitals Screenings to include cognitive, depression, and falls Referrals and appointments  In addition, I have reviewed and discussed with patient certain preventive protocols, quality metrics, and best practice recommendations. A written personalized care plan for preventive services as well as general preventive health recommendations were provided to patient.   Rojelio LELON Blush, LPN   [id number]/[id number]   After Visit Summary: (MyChart) Due to this being a telephonic visit, the after visit summary with patients personalized plan was offered to patient via MyChart   Notes: Nothing significant to report at this time.

## 2023-11-10 NOTE — Patient Instructions (Addendum)
 Mr. Troy [name] , Thank you for taking time out of your busy schedule to complete your Annual Wellness Visit with me. I enjoyed our conversation and look forward to speaking with you again next year. I, as well as your care team,  appreciate your ongoing commitment to your health goals. Please review the following plan we discussed and let me know if I can assist you in the future. Your Game plan/ To Do List    Referrals: If you haven't heard from the office you've been referred to, please reach out to them at the phone provided.   Follow up Visits: We will see or speak with you next year for your Next Medicare AWV with our clinical staff 11/15/24 @ 1p Have you seen your provider in the last 6 months (3 months if uncontrolled diabetes)?   Clinician Recommendations:  Aim for 30 minutes of exercise or brisk walking, 6-8 glasses of water , and 5 servings of fruits and vegetables each day.        This is a list of the screenings recommended for you:  Health Maintenance  Topic Date Due   DTaP/Tdap/Td vaccine (1 - Tdap) Never done   COVID-19 Vaccine (5 - 2024-25 season) 11/29/2022   Flu Shot  10/29/2023   Medicare Annual Wellness Visit  11/09/2024   Colon Cancer Screening  07/26/2028   Pneumococcal Vaccine for age over 65  Completed   Hepatitis C Screening  Completed   HIV Screening  Completed   Zoster (Shingles) Vaccine  Completed   Hepatitis B Vaccine  Aged Out   HPV Vaccine  Aged Out   Meningitis B Vaccine  Aged Out    Advanced directives: (Declined) Advance directive discussed with you today. Even though you declined this today, please call our office should you change your mind, and we can give you the proper paperwork for you to fill out. Advance Care Planning is important because it:  [x]  Makes sure you receive the medical care that is consistent with your values, goals, and preferences  [x]  It provides guidance to your family and loved ones and reduces their decisional burden about  whether or not they are making the right decisions based on your wishes.  Follow the link provided in your after visit summary or read over the paperwork we have mailed to you to help you started getting your Advance Directives in place. If you need assistance in completing these, please reach out to us  so that we can help you!  See attachments for Preventive Care and Fall Prevention Tips.

## 2023-11-11 ENCOUNTER — Other Ambulatory Visit: Payer: Self-pay | Admitting: Family Medicine

## 2023-11-11 DIAGNOSIS — M545 Low back pain, unspecified: Secondary | ICD-10-CM

## 2023-11-12 ENCOUNTER — Encounter: Payer: Self-pay | Admitting: Family Medicine

## 2023-11-14 ENCOUNTER — Ambulatory Visit: Payer: Self-pay | Admitting: Family Medicine

## 2023-11-14 DIAGNOSIS — M5416 Radiculopathy, lumbar region: Secondary | ICD-10-CM

## 2023-11-14 DIAGNOSIS — M4800 Spinal stenosis, site unspecified: Secondary | ICD-10-CM

## 2023-11-24 ENCOUNTER — Other Ambulatory Visit: Payer: Self-pay | Admitting: Family Medicine

## 2023-11-24 DIAGNOSIS — M545 Low back pain, unspecified: Secondary | ICD-10-CM

## 2023-12-07 DIAGNOSIS — M48062 Spinal stenosis, lumbar region with neurogenic claudication: Secondary | ICD-10-CM | POA: Diagnosis not present

## 2023-12-07 DIAGNOSIS — Z6826 Body mass index (BMI) 26.0-26.9, adult: Secondary | ICD-10-CM | POA: Diagnosis not present

## 2023-12-30 DIAGNOSIS — M48062 Spinal stenosis, lumbar region with neurogenic claudication: Secondary | ICD-10-CM | POA: Diagnosis not present

## 2023-12-30 DIAGNOSIS — M5416 Radiculopathy, lumbar region: Secondary | ICD-10-CM | POA: Diagnosis not present

## 2024-01-19 ENCOUNTER — Other Ambulatory Visit: Payer: Self-pay

## 2024-01-19 DIAGNOSIS — F419 Anxiety disorder, unspecified: Secondary | ICD-10-CM

## 2024-01-19 MED ORDER — BUSPIRONE HCL 10 MG PO TABS: 10.0000 mg | ORAL_TABLET | Freq: Three times a day (TID) | ORAL | 0 refills | Status: DC

## 2024-02-14 ENCOUNTER — Other Ambulatory Visit: Payer: Self-pay | Admitting: Family Medicine

## 2024-03-01 ENCOUNTER — Ambulatory Visit: Admitting: Family Medicine

## 2024-03-01 ENCOUNTER — Ambulatory Visit: Payer: Self-pay | Admitting: Family Medicine

## 2024-03-01 ENCOUNTER — Encounter: Payer: Self-pay | Admitting: Family Medicine

## 2024-03-01 VITALS — BP 126/80 | HR 72 | Temp 97.9°F | Resp 16 | Ht 69.0 in | Wt 174.0 lb

## 2024-03-01 DIAGNOSIS — Z23 Encounter for immunization: Secondary | ICD-10-CM

## 2024-03-01 DIAGNOSIS — I10 Essential (primary) hypertension: Secondary | ICD-10-CM | POA: Diagnosis not present

## 2024-03-01 DIAGNOSIS — F419 Anxiety disorder, unspecified: Secondary | ICD-10-CM | POA: Diagnosis not present

## 2024-03-01 DIAGNOSIS — R7303 Prediabetes: Secondary | ICD-10-CM | POA: Insufficient documentation

## 2024-03-01 LAB — BASIC METABOLIC PANEL WITH GFR
BUN: 10 mg/dL (ref 6–23)
CO2: 25 meq/L (ref 19–32)
Calcium: 9.7 mg/dL (ref 8.4–10.5)
Chloride: 102 meq/L (ref 96–112)
Creatinine, Ser: 0.98 mg/dL (ref 0.40–1.50)
GFR: 80.63 mL/min (ref >60.00)
Glucose, Bld: 92 mg/dL (ref 70–99)
Potassium: 3.9 meq/L (ref 3.5–5.1)
Sodium: 138 meq/L (ref 135–145)

## 2024-03-01 LAB — HEMOGLOBIN A1C: Hgb A1c MFr Bld: 5.6 % (ref 4.6–6.5)

## 2024-03-01 MED ORDER — BUSPIRONE HCL 15 MG PO TABS: 15.0000 mg | ORAL_TABLET | Freq: Two times a day (BID) | ORAL | 2 refills | Status: AC

## 2024-03-01 NOTE — Progress Notes (Signed)
 Chief Complaint  Patient presents with   Medical Management of Chronic Issues    Six month follow-up     Discussed the use of AI scribe software for clinical note transcription with the patient, who gave verbal consent to proceed.  History of Present Illness Troy [name] is a 68 year old male with with past medical history significant for anxiety, GERD, PAD, hypertension, and hyperlipidemia here today for follow-up. He was last seen on 10/25/2023.  He underwent spinal surgery on November 10th due to lumbar radiculopathy. Prior to surgery, he experienced severe back and left LE pain and had been following up with orthopedics. He had tried home exercises and received an epidural injection, which did not alleviate his symptoms, leading to the decision for surgery. Post-surgery, he feels significantly better with marked improvement in symptoms.  Anxiety: He is currently taking buspirone  10 mg tid. He sometimes takes it twice daily, forgets to take the noon dose, he feels it is sufficient most of the time, but occasionally takes it three times.   HTN: He also takes losartan  50 mg daily. His blood pressure readings at home are typically around 120/80 mmHg, although his SBP was elevated at 150 when he had his stitches removed post-surgery. Denies severe/frequent headache, visual changes, chest pain, dyspnea, palpitation, focal weakness, or edema. Component     Latest Ref Rng 08/31/2023  Creatinine     0.40 - 1.50 mg/dL 8.98   Sodium     864 - 145 mEq/L 139   Potassium     3.5 - 5.1 mEq/L 3.9   Chloride     96 - 112 mEq/L 103   CO2     19 - 32 mEq/L 27   Glucose     70 - 99 mg/dL 86   BUN     6 - 23 mg/dL 10   Calcium      8.4 - 10.5 mg/dL 89.9    He has had mildly elevated hemoglobin A1c, no history of diabetes. On 08/31/2023 hemoglobin A1c was 6.0.  Review of Systems  Constitutional:  Negative for appetite change, chills and fever.  HENT:  Negative for sore throat.    Respiratory:  Negative for cough and wheezing.   Gastrointestinal:  Negative for abdominal pain, nausea and vomiting.  Genitourinary:  Negative for decreased urine volume, dysuria and hematuria.  Skin:  Negative for rash.  Neurological:  Negative for syncope and facial asymmetry.  Psychiatric/Behavioral:  Negative for confusion and hallucinations.   See other pertinent positives and negatives in HPI.  Current Outpatient Medications on File Prior to Visit  Medication Sig Dispense Refill   ascorbic acid (VITAMIN C) 500 MG tablet Take 500 mg by mouth daily. Takes 2x/week     aspirin  EC 81 MG tablet Take 81 mg by mouth daily. Swallow whole.     atorvastatin  (LIPITOR) 40 MG tablet TAKE 1 TABLET BY MOUTH EVERY DAY FOR HIGH CHOLESTEROL 90 tablet 3   cetirizine (ZYRTEC) 10 MG tablet Take 10 mg by mouth as needed for allergies.     hyoscyamine  (LEVSIN  SL) 0.125 MG SL tablet Take 1 tablet (0.125 mg total) by mouth every 8 (eight) hours as needed. 30 tablet 2   losartan  (COZAAR ) 50 MG tablet Take 1 tablet (50 mg total) by mouth daily. 90 tablet 1   meloxicam  (MOBIC ) 7.5 MG tablet TAKE 1 TO 2 TABLETS BY MOUTH EVERY DAY AS NEEDED FOR PAIN 30 tablet 1   omeprazole  (PRILOSEC)  40 MG capsule TAKE 1 CAPSULE (40 MG TOTAL) BY MOUTH DAILY. 90 capsule 1   oxyCODONE  (OXY IR/ROXICODONE ) 5 MG immediate release tablet take 1 tablet by oral route  every 4 hours as needed for pain     Polyethyl Glycol-Propyl Glycol (SYSTANE OP) Place 1 drop into both eyes daily.     tadalafil (CIALIS) 5 MG tablet Take 5 mg by mouth as needed for erectile dysfunction.     No current facility-administered medications on file prior to visit.    Past Medical History:  Diagnosis Date   Allergy    Seasonal   Anxiety    Aortic stenosis 05/19/2018   Mild by echo 04/2018, [facility]   Arthritis    back   Cancer [facility])    prostate cancer   Complication of anesthesia    Diverticula of colon    left side.   GERD  (gastroesophageal reflux disease)    food related   Herniated disc    3 herniated discs in neck   History of kidney stones    Hyperlipidemia 09/30/2022   Hypertension    Left bundle branch block    Noted since 2016 on EKG   PONV (postoperative nausea and vomiting)    Seasonal allergies    Allergies  Allergen Reactions   Penicillins Other (See Comments)    Did it involve swelling of the face/tongue/throat, SOB, or low BP? No Did it involve sudden or severe rash/hives, skin peeling, or any reaction on the inside of your mouth or nose? No Did you need to seek medical attention at a hospital or doctor's office? No When did it last happen?      Over 10 years If all above answers are "NO", may proceed with cephalosporin use.    Numbness around mouth    Ciprofloxacin  Rash   Darvocet [Propoxyphene N-Acetaminophen ] Rash   Tylenol  [Acetaminophen ] Rash    Social History   Socioeconomic History   Marital status: Married    Spouse name: Troy [name]   Number of children: 2   Years of education: Not on file   Highest education level: 12th grade  Occupational History   Occupation: retired    Comment: Optician, [employer] in [place]   Tobacco Use   Smoking status: Never    Passive exposure: Never   Smokeless tobacco: Never  Vaping Use   Vaping status: Never Used  Substance and Sexual Activity   Alcohol  use: No   Drug use: No   Sexual activity: Yes  Other Topics Concern   Not on file  Social History Narrative   Not on file   Social Drivers of Health   Financial Resource Strain: Low Risk  (02/26/2024)   Overall Financial Resource Strain (CARDIA)    Difficulty of Paying Living Expenses: Not very hard  Food Insecurity: No Food Insecurity (02/26/2024)   Hunger Vital Sign    Worried About Running Out of Food in the Last Year: Never true    Ran Out of Food in the Last Year: Never true  Transportation Needs: No Transportation Needs (02/26/2024)   PRAPARE - Scientist, Research (physical Sciences) (Medical): No    Lack of Transportation (Non-Medical): No  Physical Activity: Insufficiently Active (02/26/2024)   Exercise Vital Sign    Days of Exercise per Week: 3 days    Minutes of Exercise per Session: 30 min  Stress: No Stress Concern Present (02/26/2024)   [employer] of Occupational Health - Occupational Stress  Questionnaire    Feeling of Stress: Only a little  Social Connections: Socially Integrated (02/26/2024)   Social Connection and Isolation Panel    Frequency of Communication with Friends and Family: More than three times a week    Frequency of Social Gatherings with Friends and Family: Once a week    Attends Religious Services: More than 4 times per year    Active Member of [employer] or Organizations: Yes    Attends Banker Meetings: 1 to 4 times per year    Marital Status: Married   Today's Vitals   03/01/24 1039  BP: 126/80  Pulse: 72  Resp: 16  Temp: 97.9 F (36.6 C)  SpO2: 96%  Weight: 174 lb (78.9 kg)  Height: 5' 9 (1.753 m)   Body mass index is 25.7 kg/m.  Physical Exam Vitals and nursing note reviewed.  Constitutional:      General: He is not in acute distress.    Appearance: He is well-developed.  HENT:     Head: Normocephalic and atraumatic.     Mouth/Throat:     Mouth: Mucous membranes are moist.     Pharynx: Oropharynx is clear.  Eyes:     Conjunctiva/sclera: Conjunctivae normal.  Cardiovascular:     Rate and Rhythm: Normal rate and regular rhythm.     Pulses:          Posterior tibial pulses are 2+ on the right side and 2+ on the left side.     Heart sounds: Murmur (SEM I-II/VI LUSB.) heard.  Pulmonary:     Effort: Pulmonary effort is normal. No respiratory distress.     Breath sounds: Normal breath sounds.  Abdominal:     Palpations: Abdomen is soft. There is no mass.     Tenderness: There is no abdominal tenderness.  Musculoskeletal:     Right lower leg: No edema.     Left lower leg: No edema.   Skin:    General: Skin is warm.     Findings: No erythema or rash.  Neurological:     General: No focal deficit present.     Mental Status: He is alert and oriented to person, place, and time.     Comments: Antalgic gait, not assisted.  Psychiatric:        Mood and Affect: Mood and affect normal.   ASSESSMENT AND PLAN:  Troy [name] was seen today for medical management of chronic issues.  Diagnoses and all orders for this visit: Orders Placed This Encounter  Procedures   Flu vaccine HIGH DOSE PF(Fluzone Trivalent)   Basic metabolic panel with GFR   Hemoglobin A1c   Lab Results  Component Value Date   HGBA1C 5.6 03/01/2024   Lab Results  Component Value Date   NA 138 03/01/2024   CL 102 03/01/2024   K 3.9 03/01/2024   CO2 25 03/01/2024   BUN 10 03/01/2024   CREATININE 0.98 03/01/2024   GFR 80.63 03/01/2024   CALCIUM  9.7 03/01/2024   PHOS 4.1 05/18/2018   ALBUMIN 4.7 08/31/2023   GLUCOSE 92 03/01/2024   Essential (primary) hypertension Assessment & Plan: BP adequately controlled. Continue Losartan  50 mg daily and low salt diet. Continue monitoring BP regularly. Eye exam is current. Follow-up in 6 months.  Orders: -     Basic metabolic panel with GFR; Future  Need for immunization against influenza -     Flu vaccine HIGH DOSE PF(Fluzone Trivalent)  Anxiety disorder, unspecified type Assessment & Plan:  On Buspar  10 mg tid, adjusted last visit. Problem has improved.  Sometimes he forgets to take the noon dose of Buspar  10 mg, so he agrees with changing to Buspar  15 mg bid.  Orders: -     busPIRone  HCl; Take 1 tablet (15 mg total) by mouth 2 (two) times daily.  Dispense: 180 tablet; Refill: 2  Prediabetes Assessment & Plan: HgA1C was 6.0 in 08/2023. Encouraged a healthy life style for diabetes prevention.  Orders: -     Hemoglobin A1c; Future  Return in about 6 months (around 08/30/2024) for chronic problems.  Troy Fusselman G. Zynia Wojtowicz, MD  [facility]. [place] office.

## 2024-03-01 NOTE — Patient Instructions (Signed)
 A few things to remember from today's visit:  Need for immunization against influenza - Plan: Flu vaccine HIGH DOSE PF(Fluzone Trivalent)  Essential (primary) hypertension - Plan: Basic metabolic panel with GFR  Anxiety disorder, unspecified type  Prediabetes - Plan: Hemoglobin A1c  Let's change Buspar  for 15 mg tab to take 2 times daily. Rest unchanged.  If you need refills for medications you take chronically, please call your pharmacy. Do not use My Chart to request refills or for acute issues that need immediate attention. If you send a my chart message, it may take a few days to be addressed, specially if I am not in the office.  Please be sure medication list is accurate. If a new problem present, please set up appointment sooner than planned today.

## 2024-03-02 NOTE — Assessment & Plan Note (Signed)
 BP adequately controlled. Continue Losartan  50 mg daily and low salt diet. Continue monitoring BP regularly. Eye exam is current. Follow-up in 6 months.

## 2024-03-02 NOTE — Assessment & Plan Note (Addendum)
 HgA1C was 6.0 in 08/2023. Encouraged a healthy life style for diabetes prevention.

## 2024-03-02 NOTE — Assessment & Plan Note (Signed)
 On Buspar  10 mg tid, adjusted last visit. Problem has improved.  Sometimes he forgets to take the noon dose of Buspar  10 mg, so he agrees with changing to Buspar  15 mg bid.

## 2024-03-03 ENCOUNTER — Encounter: Payer: Self-pay | Admitting: Family Medicine

## 2024-03-11 ENCOUNTER — Other Ambulatory Visit: Payer: Self-pay | Admitting: Family Medicine

## 2024-03-11 DIAGNOSIS — I10 Essential (primary) hypertension: Secondary | ICD-10-CM

## 2024-03-17 ENCOUNTER — Telehealth: Admitting: Nurse Practitioner

## 2024-03-17 DIAGNOSIS — J329 Chronic sinusitis, unspecified: Secondary | ICD-10-CM

## 2024-03-17 MED ORDER — HYDROCODONE BIT-HOMATROP MBR 5-1.5 MG/5ML PO SOLN: 5.0000 mL | Freq: Two times a day (BID) | ORAL | 0 refills | Status: AC | PRN

## 2024-03-17 MED ORDER — FLUTICASONE PROPIONATE 50 MCG/ACT NA SUSP: 2.0000 | Freq: Every day | NASAL | 6 refills | Status: AC

## 2024-03-17 NOTE — Progress Notes (Signed)
" ° °  Established Patient Office Visit  An audio/visual tele-health visit was completed today for this patient. I connected with  Troy [name] on 03/17/2024 utilizing audio/visual technology and verified that I am speaking with the correct person using two identifiers. The patient was located at their home, and I was located at the office of [facility] during the encounter. I discussed the limitations of evaluation and management by telemedicine. The patient expressed understanding and agreed to proceed.     Subjective   Patient ID: Troy [name], male    DOB: 10-Mar-1956  Age: 68 y.o. MRN: [id number]  Chief Complaint  Patient presents with   Nasal Congestion    Nasal congestion, headache, scratchy throat and body aches.   Discussed the use of AI scribe software for clinical note transcription with the patient, who gave verbal consent to proceed.  History of Present Illness Troy [name] is a 68 year old male who presents with nasal congestion, headache, scratchy throat, and body aches.  Upper respiratory symptoms - Symptom onset 3 days ago - Nasal congestion worsening, scratchy throat, headache. Cough and congestion are most bothersome symptoms - Body aches and chills without fever - No fever, shortness of breath, or wheezing - No history of asthma or COPD - No known sick contacts - Wife recently developed similar symptoms  Symptomatic management - Using Advil , Mucinex  - Using cough drops for cough  At-home POC flu and covid test: taken today results were negative.         Review of Systems  Constitutional:  Positive for chills. Negative for fever.  HENT:  Positive for congestion and sore throat.   Cardiovascular:  Negative for chest pain.  Musculoskeletal:  Positive for myalgias.  Neurological:  Positive for headaches.      Objective:     There were no vitals taken for this visit.   Physical Exam Comprehensive physical exam  not completed today as office visit was conducted remotely.  No signs of respiratory distress.  Patient was alert and oriented, and appeared to have appropriate judgment.   No results found for any visits on 03/17/24.    The ASCVD Risk score (Arnett DK, et al., 2019) failed to calculate for the following reasons:   The valid total cholesterol range is 130 to 320 mg/dL    Assessment & Plan:   Problem List Items Addressed This Visit   None Visit Diagnoses       Sinusitis, unspecified chronicity, unspecified location    -  Primary   Relevant Medications   HYDROcodone  bit-homatropine (HYCODAN) 5-1.5 MG/5ML syrup   fluticasone  (FLONASE ) 50 MCG/ACT nasal spray      Assessment and Plan Assessment & Plan Acute upper respiratory infection Symptoms suggest viral etiology. Negative flu and COVID tests. No underlying respiratory disease. - Prescribed Hicodan cough syrup BID PRN cough. Advised against driving, alcohol  intake, and heavy machinery. - Prescribed Flonase  nasal spray, two sprays each nostril daily. - Continue ibuprofen  and mucinex  for symptoms. - Advised to contact primary care if symptoms worsen or persist for further evaluation.   Return if symptoms worsen or fail to improve.    Lauraine FORBES Pereyra, NP  "

## 2024-04-10 ENCOUNTER — Telehealth: Payer: Self-pay | Admitting: Family Medicine

## 2024-04-10 NOTE — Telephone Encounter (Signed)
 Copied from CRM [id number]. Topic: Medical Record Request - Patient Chart Correction Request >> Apr 05, 2024 10:15 AM Viola FALCON wrote: Reason for CRM: Patient called to let office know that an incorrect medical record was scanned into his chart for another patient. It's the echocardiogram from 03/16/24. Please advise and let patient know when his MyChart account can be reactivated.  Submitted a IT ticket for this to be removed out of pt account as well a safety portal.

## 2024-04-16 ENCOUNTER — Other Ambulatory Visit: Payer: Self-pay | Admitting: Family Medicine

## 2024-04-16 DIAGNOSIS — M545 Low back pain, unspecified: Secondary | ICD-10-CM

## 2024-08-30 ENCOUNTER — Ambulatory Visit: Admitting: Family Medicine

## 2024-11-15 ENCOUNTER — Ambulatory Visit
